# Patient Record
Sex: Female | Born: 1955 | Race: White | Hispanic: No | Smoking: Current some day smoker
Health system: Southern US, Community
[De-identification: ages and names within clinical notes are randomized; demographics above are authoritative.]

## PROBLEM LIST (undated history)

## (undated) DIAGNOSIS — F32A Depression, unspecified: Secondary | ICD-10-CM

## (undated) DIAGNOSIS — F329 Major depressive disorder, single episode, unspecified: Secondary | ICD-10-CM

## (undated) HISTORY — PX: TOTAL HIP ARTHROPLASTY: SHX124

## (undated) HISTORY — PX: BREAST ENHANCEMENT SURGERY: SHX7

## (undated) HISTORY — DX: Major depressive disorder, single episode, unspecified: F32.9

## (undated) HISTORY — DX: Depression, unspecified: F32.A

---

## 1988-03-05 HISTORY — PX: SMALL INTESTINE SURGERY: SHX150

## 1988-03-05 HISTORY — PX: APPENDECTOMY: SHX54

## 2002-03-05 HISTORY — PX: TUBAL LIGATION: SHX77

## 2004-10-27 ENCOUNTER — Ambulatory Visit (HOSPITAL_COMMUNITY): Admission: RE | Admit: 2004-10-27 | Discharge: 2004-10-28 | Payer: Self-pay | Admitting: Neurosurgery

## 2007-03-21 ENCOUNTER — Inpatient Hospital Stay (HOSPITAL_COMMUNITY): Admission: RE | Admit: 2007-03-21 | Discharge: 2007-03-26 | Payer: Self-pay | Admitting: Neurological Surgery

## 2007-03-21 HISTORY — PX: LUMBAR FUSION: SHX111

## 2007-05-01 ENCOUNTER — Encounter: Admission: RE | Admit: 2007-05-01 | Discharge: 2007-05-01 | Payer: Self-pay | Admitting: Neurosurgery

## 2007-06-05 ENCOUNTER — Encounter: Payer: Self-pay | Admitting: Family Medicine

## 2007-06-05 ENCOUNTER — Ambulatory Visit: Payer: Self-pay | Admitting: Family Medicine

## 2007-06-05 ENCOUNTER — Other Ambulatory Visit: Admission: RE | Admit: 2007-06-05 | Discharge: 2007-06-05 | Payer: Self-pay | Admitting: Family Medicine

## 2007-06-05 DIAGNOSIS — L84 Corns and callosities: Secondary | ICD-10-CM

## 2007-06-05 DIAGNOSIS — M7130 Other bursal cyst, unspecified site: Secondary | ICD-10-CM | POA: Insufficient documentation

## 2007-06-05 DIAGNOSIS — R9431 Abnormal electrocardiogram [ECG] [EKG]: Secondary | ICD-10-CM

## 2007-06-05 LAB — CONVERTED CEMR LAB
Blood in Urine, dipstick: NEGATIVE
Glucose, Urine, Semiquant: NEGATIVE
Ketones, urine, test strip: NEGATIVE
Protein, U semiquant: NEGATIVE
Specific Gravity, Urine: 1.02
pH: 6

## 2007-06-06 ENCOUNTER — Encounter (INDEPENDENT_AMBULATORY_CARE_PROVIDER_SITE_OTHER): Payer: Self-pay | Admitting: *Deleted

## 2007-06-06 LAB — CONVERTED CEMR LAB
Albumin: 4.5 g/dL (ref 3.5–5.2)
Alkaline Phosphatase: 39 units/L (ref 39–117)
BUN: 11 mg/dL (ref 6–23)
Basophils Absolute: 0.1 10*3/uL (ref 0.0–0.1)
Eosinophils Absolute: 0.2 10*3/uL (ref 0.0–0.7)
Eosinophils Relative: 2.2 % (ref 0.0–5.0)
GFR calc Af Amer: 98 mL/min
GFR calc non Af Amer: 81 mL/min
HCT: 50.9 % — ABNORMAL HIGH (ref 36.0–46.0)
HDL: 42.1 mg/dL (ref >39.0)
MCHC: 32.7 g/dL (ref 30.0–36.0)
MCV: 97.4 fL (ref 78.0–100.0)
Monocytes Absolute: 0.6 10*3/uL (ref 0.1–1.0)
Platelets: 307 10*3/uL (ref 150–400)
Potassium: 3.7 meq/L (ref 3.5–5.1)
RDW: 13.4 % (ref 11.5–14.6)
Sodium: 143 meq/L (ref 135–145)
Triglycerides: 191 mg/dL — ABNORMAL HIGH (ref 0–149)

## 2007-06-10 ENCOUNTER — Telehealth (INDEPENDENT_AMBULATORY_CARE_PROVIDER_SITE_OTHER): Payer: Self-pay | Admitting: *Deleted

## 2007-06-12 ENCOUNTER — Ambulatory Visit: Payer: Self-pay | Admitting: Family Medicine

## 2007-06-12 DIAGNOSIS — A5901 Trichomonal vulvovaginitis: Secondary | ICD-10-CM

## 2007-06-19 ENCOUNTER — Ambulatory Visit: Payer: Self-pay | Admitting: Cardiology

## 2007-06-19 ENCOUNTER — Ambulatory Visit: Payer: Self-pay

## 2007-06-19 ENCOUNTER — Encounter: Payer: Self-pay | Admitting: Family Medicine

## 2007-06-24 ENCOUNTER — Ambulatory Visit: Payer: Self-pay | Admitting: Family Medicine

## 2007-06-24 LAB — CONVERTED CEMR LAB
Bilirubin Urine: NEGATIVE
Ketones, urine, test strip: NEGATIVE
Urobilinogen, UA: 0.2
pH: 7

## 2007-06-25 ENCOUNTER — Ambulatory Visit: Payer: Self-pay | Admitting: Cardiology

## 2007-06-26 ENCOUNTER — Telehealth (INDEPENDENT_AMBULATORY_CARE_PROVIDER_SITE_OTHER): Payer: Self-pay | Admitting: *Deleted

## 2007-07-03 ENCOUNTER — Encounter: Admission: RE | Admit: 2007-07-03 | Discharge: 2007-07-03 | Payer: Self-pay | Admitting: Neurosurgery

## 2007-07-07 ENCOUNTER — Encounter: Payer: Self-pay | Admitting: Family Medicine

## 2007-07-31 ENCOUNTER — Encounter: Payer: Self-pay | Admitting: Cardiology

## 2007-07-31 ENCOUNTER — Ambulatory Visit: Payer: Self-pay

## 2007-09-04 ENCOUNTER — Encounter: Admission: RE | Admit: 2007-09-04 | Discharge: 2007-09-04 | Payer: Self-pay | Admitting: Neurosurgery

## 2007-10-01 ENCOUNTER — Ambulatory Visit: Payer: Self-pay | Admitting: Family Medicine

## 2007-11-11 ENCOUNTER — Encounter: Payer: Self-pay | Admitting: Family Medicine

## 2007-11-13 ENCOUNTER — Encounter: Admission: RE | Admit: 2007-11-13 | Discharge: 2007-11-13 | Payer: Self-pay | Admitting: Neurosurgery

## 2008-02-01 ENCOUNTER — Telehealth: Payer: Self-pay | Admitting: Internal Medicine

## 2008-05-26 ENCOUNTER — Inpatient Hospital Stay (HOSPITAL_COMMUNITY): Admission: RE | Admit: 2008-05-26 | Discharge: 2008-05-28 | Payer: Self-pay | Admitting: Neurosurgery

## 2008-07-06 ENCOUNTER — Other Ambulatory Visit: Admission: RE | Admit: 2008-07-06 | Discharge: 2008-07-06 | Payer: Self-pay | Admitting: Family Medicine

## 2008-07-06 ENCOUNTER — Encounter: Payer: Self-pay | Admitting: Family Medicine

## 2008-07-06 ENCOUNTER — Ambulatory Visit: Payer: Self-pay | Admitting: Family Medicine

## 2008-07-06 DIAGNOSIS — F329 Major depressive disorder, single episode, unspecified: Secondary | ICD-10-CM

## 2008-07-07 ENCOUNTER — Encounter: Payer: Self-pay | Admitting: Family Medicine

## 2008-07-08 ENCOUNTER — Encounter (INDEPENDENT_AMBULATORY_CARE_PROVIDER_SITE_OTHER): Payer: Self-pay | Admitting: *Deleted

## 2008-07-12 ENCOUNTER — Ambulatory Visit: Payer: Self-pay | Admitting: Family Medicine

## 2008-07-12 LAB — CONVERTED CEMR LAB
Blood in Urine, dipstick: NEGATIVE
Ketones, urine, test strip: NEGATIVE
Nitrite: NEGATIVE
Specific Gravity, Urine: <1.005
Urobilinogen, UA: 0.2
WBC Urine, dipstick: NEGATIVE

## 2008-07-13 ENCOUNTER — Encounter (INDEPENDENT_AMBULATORY_CARE_PROVIDER_SITE_OTHER): Payer: Self-pay | Admitting: *Deleted

## 2008-07-13 LAB — CONVERTED CEMR LAB
AST: 25 units/L (ref 0–37)
Alkaline Phosphatase: 69 units/L (ref 39–117)
Basophils Absolute: 0 10*3/uL (ref 0.0–0.1)
Basophils Relative: 0 % (ref 0.0–3.0)
Bilirubin, Direct: 0 mg/dL (ref 0.0–0.3)
CO2: 28 meq/L (ref 19–32)
Calcium: 9.9 mg/dL (ref 8.4–10.5)
Chloride: 109 meq/L (ref 96–112)
Eosinophils Absolute: 0.4 10*3/uL (ref 0.0–0.7)
Glucose, Bld: 97 mg/dL (ref 70–99)
HCT: 46 % (ref 36.0–46.0)
HDL: 54.9 mg/dL (ref >39.00)
Hemoglobin: 15.5 g/dL — ABNORMAL HIGH (ref 12.0–15.0)
Lymphs Abs: 2.3 10*3/uL (ref 0.7–4.0)
MCHC: 33.7 g/dL (ref 30.0–36.0)
MCV: 96.8 fL (ref 78.0–100.0)
Monocytes Absolute: 0.8 10*3/uL (ref 0.1–1.0)
Neutro Abs: 3.1 10*3/uL (ref 1.4–7.7)
Potassium: 4.6 meq/L (ref 3.5–5.1)
RBC: 4.75 M/uL (ref 3.87–5.11)
RDW: 14.6 % (ref 11.5–14.6)
Sodium: 144 meq/L (ref 135–145)
Total CHOL/HDL Ratio: 3
Total Protein: 7.4 g/dL (ref 6.0–8.3)

## 2008-07-14 ENCOUNTER — Encounter (INDEPENDENT_AMBULATORY_CARE_PROVIDER_SITE_OTHER): Payer: Self-pay | Admitting: *Deleted

## 2008-10-17 ENCOUNTER — Telehealth: Payer: Self-pay | Admitting: Family Medicine

## 2008-10-21 ENCOUNTER — Telehealth (INDEPENDENT_AMBULATORY_CARE_PROVIDER_SITE_OTHER): Payer: Self-pay | Admitting: *Deleted

## 2008-11-17 LAB — HM MAMMOGRAPHY: HM Mammogram: NORMAL

## 2008-12-13 ENCOUNTER — Encounter: Payer: Self-pay | Admitting: Family Medicine

## 2009-01-20 ENCOUNTER — Encounter: Payer: Self-pay | Admitting: Family Medicine

## 2009-02-15 ENCOUNTER — Telehealth: Payer: Self-pay | Admitting: Family Medicine

## 2009-02-18 ENCOUNTER — Telehealth (INDEPENDENT_AMBULATORY_CARE_PROVIDER_SITE_OTHER): Payer: Self-pay | Admitting: *Deleted

## 2009-03-24 ENCOUNTER — Encounter: Payer: Self-pay | Admitting: Family Medicine

## 2009-05-26 ENCOUNTER — Ambulatory Visit: Payer: Self-pay | Admitting: Family Medicine

## 2009-05-31 ENCOUNTER — Telehealth (INDEPENDENT_AMBULATORY_CARE_PROVIDER_SITE_OTHER): Payer: Self-pay | Admitting: *Deleted

## 2009-07-29 ENCOUNTER — Telehealth: Payer: Self-pay | Admitting: Family Medicine

## 2009-07-30 ENCOUNTER — Encounter: Payer: Self-pay | Admitting: Family Medicine

## 2009-08-02 ENCOUNTER — Telehealth: Payer: Self-pay | Admitting: Family Medicine

## 2009-08-02 ENCOUNTER — Encounter: Payer: Self-pay | Admitting: Family Medicine

## 2009-08-02 DIAGNOSIS — M25559 Pain in unspecified hip: Secondary | ICD-10-CM | POA: Insufficient documentation

## 2009-08-17 ENCOUNTER — Other Ambulatory Visit: Admission: RE | Admit: 2009-08-17 | Discharge: 2009-08-17 | Payer: Self-pay | Admitting: Family Medicine

## 2009-08-17 ENCOUNTER — Ambulatory Visit: Payer: Self-pay | Admitting: Family Medicine

## 2009-08-17 DIAGNOSIS — M1610 Unilateral primary osteoarthritis, unspecified hip: Secondary | ICD-10-CM | POA: Insufficient documentation

## 2009-08-17 DIAGNOSIS — B356 Tinea cruris: Secondary | ICD-10-CM

## 2009-08-17 DIAGNOSIS — M169 Osteoarthritis of hip, unspecified: Secondary | ICD-10-CM

## 2009-08-17 LAB — CONVERTED CEMR LAB
Bilirubin Urine: NEGATIVE
Ketones, urine, test strip: NEGATIVE
Nitrite: NEGATIVE
Protein, U semiquant: NEGATIVE
Urobilinogen, UA: NEGATIVE

## 2009-08-19 ENCOUNTER — Encounter (INDEPENDENT_AMBULATORY_CARE_PROVIDER_SITE_OTHER): Payer: Self-pay | Admitting: *Deleted

## 2009-08-19 LAB — CONVERTED CEMR LAB
ALT: 19 units/L (ref 0–35)
AST: 28 units/L (ref 0–37)
Alkaline Phosphatase: 66 units/L (ref 39–117)
Basophils Relative: 0.5 % (ref 0.0–3.0)
Bilirubin, Direct: 0.1 mg/dL (ref 0.0–0.3)
Calcium: 9.8 mg/dL (ref 8.4–10.5)
Cholesterol: 209 mg/dL — ABNORMAL HIGH (ref 0–200)
Creatinine, Ser: 0.8 mg/dL (ref 0.4–1.2)
Eosinophils Relative: 4.1 % (ref 0.0–5.0)
Lymphocytes Relative: 30.2 % (ref 12.0–46.0)
Monocytes Relative: 9.4 % (ref 3.0–12.0)
Neutrophils Relative %: 55.8 % (ref 43.0–77.0)
RBC: 4.14 M/uL (ref 3.87–5.11)
Sodium: 139 meq/L (ref 135–145)
Total CHOL/HDL Ratio: 5
Total Protein: 7.5 g/dL (ref 6.0–8.3)
Triglycerides: 304 mg/dL — ABNORMAL HIGH (ref 0.0–149.0)
VLDL: 60.8 mg/dL — ABNORMAL HIGH (ref 0.0–40.0)
WBC: 6.3 10*3/uL (ref 4.5–10.5)

## 2009-11-01 ENCOUNTER — Ambulatory Visit: Payer: Self-pay | Admitting: Family Medicine

## 2009-11-01 DIAGNOSIS — N390 Urinary tract infection, site not specified: Secondary | ICD-10-CM | POA: Insufficient documentation

## 2009-11-01 LAB — CONVERTED CEMR LAB
Blood in Urine, dipstick: NEGATIVE
Glucose, Urine, Semiquant: NEGATIVE
Nitrite: POSITIVE
Protein, U semiquant: NEGATIVE
pH: 5

## 2009-11-02 ENCOUNTER — Encounter: Payer: Self-pay | Admitting: Family Medicine

## 2009-11-03 ENCOUNTER — Telehealth: Payer: Self-pay | Admitting: Family Medicine

## 2009-11-08 ENCOUNTER — Encounter (INDEPENDENT_AMBULATORY_CARE_PROVIDER_SITE_OTHER): Payer: Self-pay | Admitting: *Deleted

## 2009-11-30 ENCOUNTER — Encounter: Payer: Self-pay | Admitting: Family Medicine

## 2009-12-06 ENCOUNTER — Ambulatory Visit (HOSPITAL_COMMUNITY): Admission: RE | Admit: 2009-12-06 | Discharge: 2009-12-06 | Payer: Self-pay | Admitting: Urology

## 2009-12-09 ENCOUNTER — Encounter: Payer: Self-pay | Admitting: Family Medicine

## 2009-12-12 ENCOUNTER — Encounter: Payer: Self-pay | Admitting: Family Medicine

## 2010-04-04 NOTE — Progress Notes (Signed)
Summary: Referral   Phone Note Call from Patient Call back at Home Phone [phone]   Caller: Patient Reason for Call: Referral Summary of Call: Patient needs a referral to [facility] for pain mangement. Initial call taken by: Harold Barban,  Aug 02, 2009 2:13 PM  Follow-up for Phone Call        she is getting ready to have a hip replacement----  why does she need pain management now? Follow-up by: Loreen Freud DO,  Aug 02, 2009 2:53 PM  Additional Follow-up for Phone Call Additional follow up Details #1::        left message to call back. Army Fossa CMA  Aug 02, 2009 3:01 PM   New Problems: HIP PAIN, BILATERAL (ICD-719.45)   Additional Follow-up for Phone Call Additional follow up Details #2::    Pt states that Dr.Moore has referred her to pain management, they stated they also PCP to refer her to. Army Fossa CMA  Aug 02, 2009 3:16 PM   Additional Follow-up for Phone Call Additional follow up Details #3:: Details for Additional Follow-up Action Taken: referral put in but we have no records because we did not see her for pain.   Additional Follow-up by: Loreen Freud DO,  Aug 02, 2009 3:30 PM  New Problems: HIP PAIN, BILATERAL (ICD-719.45)

## 2010-04-04 NOTE — Letter (Signed)
Summary: Surgical Clearance/[facility]  Surgical Clearance/[facility]   Imported By: Lanelle Bal 08/10/2009 10:04:40  _____________________________________________________________________  External Attachment:    Type:   Image     Comment:   External Document

## 2010-04-04 NOTE — Letter (Signed)
Summary: Primary Care Consult Scheduled Letter  [facility] [place]  [address] [place], [place] [address]   Phone: [phone]  Fax: [phone]      11/08/2009 MRN: [id number]  Ashley Salinas [address] [place], [place]  [address]    Dear [name],    We have scheduled an appointment for you.  At the recommendation of Dr. Loreen Freud, we have scheduled you a consult with Dr. Su Grand of [facility] on 11-30-2009 at 9:15am.  Their address is [address] floor, [place] [address]. The office phone number is [phone].  If this appointment day and time is not convenient for you, please feel free to call the office of the doctor you are being referred to at the number listed above and reschedule the appointment.    It is important for you to keep your scheduled appointments. We are here to make sure you are given good patient care.   Thank you,    Renee, Patient Care Coordinator [place] [facility]

## 2010-04-04 NOTE — Consult Note (Signed)
Summary: [facility]  [facility]   Imported By: Lennie Odor 12/09/2009 10:43:02  _____________________________________________________________________  External Attachment:    Type:   Image     Comment:   External Document

## 2010-04-04 NOTE — Progress Notes (Signed)
Summary: Surgical Clearance(ASAP)  Phone Note From Other Clinic Call back at [phone], Fax [phone]   Caller: [facility] [place] Summary of Call: Patient goes for [facility] at 1pm today and Dr.Moore's office needs to know if patient was cleared or not for surgery. Would like OV stating patient was cleared faxed to above Fax number, If any questions please call [place]  May 31, 2009 10:06 AM   Follow-up for Phone Call        Left message for [place].. It does state in the OV that she was surgically cleared for surgery. Army Fossa CMA  May 31, 2009 10:21 AM   Additional Follow-up for Phone Call Additional follow up Details #1::        Spoke with [facility]. Faxed over pts OV. Army Fossa CMA  May 31, 2009 10:34 AM

## 2010-04-04 NOTE — Progress Notes (Signed)
Summary: surgery clearance  Phone Note Call from Patient Call back at [phone] ext [phone]   Caller: [place] ([facility]) Summary of Call: Pt seen on 05-26-09 for surgery clearance for right hip.pt is to have another hip replacement on the left side. Pt would like to know if it would be possible to  give clearance without seeing dr Laury Axon again .Marland KitchenPLS advise...................Marland KitchenFelecia Deloach CMA  Jul 29, 2009 3:44 PM   Follow-up for Phone Call        Yes ---she is cleared for surgery Follow-up by: Loreen Freud DO,  Jul 30, 2009 8:37 AM  Additional Follow-up for Phone Call Additional follow up Details #1::        left detail message pt cleared for surgery and letter Fax [phone]. Office to call if any further info needed............Marland KitchenFelecia Deloach CMA  Aug 02, 2009 9:28 AM

## 2010-04-04 NOTE — Letter (Signed)
Summary: Generic Letter  [facility] [place]  [address] [place], [place] [address]   Phone: [phone]  Fax: [phone]    07/30/2009  Re: Ashley Salinas [address] [place], [place]  [address]  To Whom It May Concern:  Ms Eschete is a 55yo white female with a history of depression.  She is medically cleared to have a hip replacement.  Please feel free to call with any questions.            Sincerely,   Loreen Freud DO

## 2010-04-04 NOTE — Assessment & Plan Note (Signed)
Summary: CPX AND PAP AND FASTING LABS///[facility]   Vital Signs:  Patient profile:   55 year old female Weight:      171.13 pounds BMI:     26.50 Pulse rate:   85 / minute Pulse rhythm:   regular BP sitting:   118 / 72  (left arm) Cuff size:   regular  Vitals Entered By: Army Fossa CMA (August 17, 2009 9:10 AM) CC: Pt here for CPX, and pap. Pain Assessment Patient in pain? yes     Location: hip Nutritional Status BMI of 25 - 29 = overweight Nutritional Status Detail Pt eats a healthy diet  Does patient need assistance? Functional Status Self care, Cook/clean, Shopping, Social activities Ambulation Impaired:Risk for fall Comments Pt uses walker to get around  Vision Screening:      Vision Comments: optho-- q1y-- + glasses for reading--- [place] Quant 40db HL: Left  Right  Audiometry Comment: gross normal hearing with whisper -- 6 feet    Prevention & Chronic Care Immunizations   Influenza vaccine: Fluvax 3+  (01/20/2009)   Influenza vaccine due: 01/20/2010    Tetanus booster: 12/25/2006: Td   Tetanus booster due: 12/24/2016    Pneumococcal vaccine: Not documented  Colorectal Screening   Hemoccult: Not documented    Colonoscopy: polyps--  repeat 1 year-- [facility]  (05/23/2007)   Colonoscopy due: 05/22/2017  Other Screening   Pap smear: NEGATIVE FOR INTRAEPITHELIAL LESIONS OR MALIGNANCY.  (07/06/2008)   Pap smear due: 07/06/2009    Mammogram: normal  (11/17/2008)   Mammogram due: 11/17/2009   Smoking status: current  (08/17/2009)   Smoking cessation counseling: yes  (08/17/2009)  Lipids   Total Cholesterol: 179  (07/12/2008)   LDL: 103  (07/12/2008)   LDL Direct: Not documented   HDL: 54.90  (07/12/2008)   Triglycerides: 108.0  (07/12/2008)   History of Present Illness: Pt her for cpe, pap and labs.   Pt only c/o rash in groin area for about 2-3 weeks.  Pt tried A&D ointment and other otc powders etc with no relief.  + watery d/c ---no odor.    Preventive  Screening-Counseling & Management  Alcohol-Tobacco     Alcohol drinks/day: <1     Alcohol type: wine     Smoking Status: current     Smoking Cessation Counseling: yes     Smoke Cessation Stage: ready     Packs/Day: 0.5     Year Started: 1973     Passive Smoke Exposure: no  Caffeine-Diet-Exercise     Caffeine use/day: 2     Does Patient Exercise: yes     Type of exercise: walking  Hep-HIV-STD-Contraception     HIV Risk: no     Dental Visit-last 6 months yes     Dental Care Counseling: not indicated; dental care within six months     SBE monthly: yes     SBE Education/Counseling: not indicated; SBE done regularly     Sun Exposure-Excessive: yes     Sun Exposure Counseling: to decrease sun exposure  Safety-Violence-Falls     Seat Belt Use: yes     Seat Belt Counseling: not indicated; patient wears seat belts     Firearms in the Home: no firearms in the home     Smoke Detectors: yes     Violence in the Home: no risk noted     Violence Counseling: not indicated; no violence risk noted     Sexual Abuse: no     Sexual Abuse Counseling:  no     Fall Risk: yes     Fall Risk Counseling: counseling provided; falls with injury noted  Comments: Pt is not stable when walking secondary to hips--- hip replacement scheduled for July      Sexual History:  currently monogamous and widow.        Drug Use:  never.    Current Medications (verified): 1)  Percocet 10-325 Mg  Tabs (Oxycodone-Acetaminophen) .Marland Kitchen.. 1-2 By Mouth Q4h As Needed 2)  Valium 5 Mg  Tabs (Diazepam) .Marland Kitchen.. 1 By Mouth At Bedtime 3)  Lexapro 10 Mg Tabs (Escitalopram Oxalate) .... 3 Tab By Mouth Once Daily 4)  Klonopin 2 Mg Tabs (Clonazepam) .Marland Kitchen.. 1 By Mouth Daily 5)  Lotrisone 1-0.05 % Crea (Clotrimazole-Betamethasone) .... Apply Two Times A Day  Allergies: 1)  ! * Azithromycin  Past History:  Past Medical History: Last updated: 07/06/2008 Current Problems:  CALLUS, RIGHT FOOT (ICD-700) SYNOVIAL CYST  (ICD-727.40) OTH PLASTIC SURGERY UNACCEPTABLE COSMETIC APPEAR (ICD-V50.1) Depression  Family History: Last updated: 06/05/2007 mother-breast cancer  Family History Breast cancer 1st degree relative <50  Social History: Last updated: 05/26/2009 etoh-occasionally Occupation: [employer] disability Current Smoker Alcohol use-yes Drug use-no Regular exercise-no widow  Risk Factors: Alcohol Use: <1 (08/17/2009) Caffeine Use: 2 (08/17/2009) Exercise: yes (08/17/2009)  Risk Factors: Smoking Status: current (08/17/2009) Packs/Day: 0.5 (08/17/2009) Passive Smoke Exposure: no (08/17/2009)  Past Surgical History: Lumbar fusion (03/21/2007) breast aug. 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 mother-breast cancer  Family History Breast cancer 1st degree relative <50  Social History: Reviewed history from 05/26/2009 and no changes required. etoh-occasionally Occupation: [employer] disability Current Smoker Alcohol use-yes Drug use-no Regular exercise-no widowFall Risk:  yes Sexual History:  currently monogamous, widow   Review of Systems      See HPI General:  Denies chills, fatigue, fever, loss of appetite, malaise, sleep disorder, sweats, weakness, and weight loss. Eyes:  Denies blurring, discharge, double vision, eye irritation, eye pain, halos, itching, light sensitivity, red eye, vision loss-1 eye, and vision loss-both eyes. ENT:  Denies decreased hearing, difficulty swallowing, ear discharge, earache, hoarseness, nasal congestion, nosebleeds, postnasal drainage, ringing in ears, sinus pressure, and sore throat. CV:  Denies  bluish discoloration of lips or nails, chest pain or discomfort, difficulty breathing at night, difficulty breathing while lying down, fainting, fatigue, leg cramps with exertion, lightheadness, near fainting, palpitations, shortness of breath with exertion, swelling of feet, swelling of hands, and weight gain. Resp:  Denies chest discomfort, chest pain with inspiration, cough, coughing up blood, excessive snoring, hypersomnolence, morning headaches, pleuritic, shortness of breath, sputum productive, and wheezing. GI:  Denies abdominal pain, bloody stools, change in bowel habits, constipation, dark tarry stools, diarrhea, excessive appetite, gas, hemorrhoids, indigestion, loss of appetite, nausea, vomiting, vomiting blood, and yellowish skin color. GU:  Complains of discharge; denies abnormal vaginal bleeding, decreased libido, dysuria, genital sores, hematuria, incontinence, nocturia, urinary frequency, and urinary hesitancy. MS:  Complains of joint pain; denies joint redness, joint swelling, loss of strength, low back pain, mid back pain, muscle aches, muscle , cramps, muscle weakness, stiffness, and thoracic pain; b/l hip pain. Derm:  Denies changes in color of skin, changes in nail beds, dryness, excessive perspiration, flushing, hair loss,  insect bite(s), itching, lesion(s), poor wound healing, and rash. Neuro:  Denies brief paralysis, difficulty with concentration, disturbances in coordination, falling down, headaches, inability to speak, memory loss, numbness, poor balance, seizures, sensation of room spinning, tingling, tremors, visual disturbances, and weakness. Psych:  Denies alternate hallucination ( auditory/visual), anxiety, depression, easily angered, easily tearful, irritability, mental problems, panic attacks, sense of great danger, suicidal thoughts/plans, thoughts of violence, unusual visions or sounds, and thoughts /plans of harming others; psych-- Dr Sandria Manly --- [facility]. Endo:  Denies cold intolerance, excessive hunger, excessive thirst, excessive urination, heat intolerance, polyuria, and weight change. Heme:  Denies abnormal bruising, bleeding, enlarge lymph nodes, fevers, pallor, and skin discoloration. Allergy:  Denies hives or rash, itching eyes, persistent infections, seasonal allergies, and sneezing.  Physical Exam  General:  Well-developed,well-nourished,in no acute distress; alert,appropriate and cooperative throughout examination Head:  Normocephalic and atraumatic without obvious abnormalities. No apparent alopecia or balding. Eyes:  vision grossly intact, pupils equal, pupils round, pupils reactive to light, and no injection.   Ears:  External ear exam shows no significant lesions or deformities.  Otoscopic examination reveals clear canals, tympanic membranes are intact bilaterally without bulging, retraction, inflammation or discharge. Hearing is grossly normal bilaterally. Nose:  External nasal examination shows no deformity or inflammation. Nasal mucosa are pink and moist without lesions or exudates. Mouth:  Oral mucosa and oropharynx without lesions or exudates.  Teeth in good repair. Neck:  No deformities, masses, or tenderness noted.no carotid bruits.   Chest Wall:  No deformities, masses, or tenderness noted. Breasts:  No mass, nodules, thickening, tenderness, bulging, retraction, inflamation, nipple discharge or skin changes noted.   Lungs:  Normal respiratory effort, chest expands symmetrically. Lungs are clear to auscultation, no crackles or wheezes. Heart:  Normal rate and regular rhythm. S1 and S2 normal without gallop, murmur, click, rub or other extra sounds. Abdomen:  Bowel sounds positive,abdomen soft and non-tender without masses, organomegaly or hernias noted. Rectal:  No external abnormalities noted. Normal sphincter tone. No rectal masses or tenderness. Genitalia:  Pelvic Exam:        External: normal female genitalia  + errythema c/w candida  no d/c no masses        Vagina: normal without lesions or masses        Cervix: normal without lesions or masses        Adnexa: normal bimanual exam without masses or fullness        Uterus: normal by palpation        Pap smear: performed Msk:  decrease rom hips good strength upper ext Pulses:  R posterior tibial normal, R dorsalis pedis normal, R carotid normal, L posterior tibial normal, L dorsalis pedis normal, and L carotid normal.   Extremities:  No clubbing, cyanosis, edema, or deformity noted with normal full range of motion of all joints.   Neurologic:  No cranial nerve deficits noted. Station and gait are normal. Plantar reflexes are down-going bilaterally. DTRs are symmetrical throughout.  Skin:  Intact without suspicious lesions or rashes Cervical Nodes:  No lymphadenopathy noted Axillary Nodes:  No palpable lymphadenopathy Psych:  Cognition and judgment appear intact. Alert and cooperative with normal attention span and concentration. No apparent delusions, illusions, hallucinations   Impression & Recommendations:  Problem # 1:  PREVENTIVE HEALTH CARE (ICD-V70.0)  Orders: Venipuncture (84132) TLB-Lipid Panel (80061-LIPID) TLB-BMP (Basic Metabolic Panel-BMET) (80048-METABOL) TLB-CBC Platelet - w/Differential (85025-CBCD) TLB-Hepatic/Liver Function Pnl (80076-HEPATIC) UA Dipstick w/o Micro (manual) (44010) First annual  wellness visit with prevention plan  (Z6109) EKG w/ Interpretation (93000) Pelvic & Breast Exam ( Medicare)  (G0101) Prescription Created Electronically [id number])  Problem # 2:  TINEA CRURIS (ICD-110.3)  lotrisone two times a day   Orders: Prescription Created Electronically [phone])  Problem # 3:  DEGENERATIVE JOINT DISEASE, HIPS (ICD-715.95)  Her updated medication list for this problem includes:    Percocet 10-325 Mg Tabs (Oxycodone-acetaminophen) .Marland Kitchen... 1-2 by mouth q4h as needed  Problem # 4:  DEPRESSION (ICD-311)  Her  updated medication list for this problem includes:    Valium 5 Mg Tabs (Diazepam) .Marland Kitchen... 1 by mouth at bedtime    Lexapro 10 Mg Tabs (Escitalopram oxalate) .Marland KitchenMarland KitchenMarland KitchenMarland Kitchen 3 tab by mouth once daily    Klonopin 2 Mg Tabs (Clonazepam) .Marland Kitchen... 1 by mouth daily  Complete Medication List: 1)  Percocet 10-325 Mg Tabs (Oxycodone-acetaminophen) .Marland Kitchen.. 1-2 by mouth q4h as needed 2)  Valium 5 Mg Tabs (Diazepam) .Marland Kitchen.. 1 by mouth at bedtime 3)  Lexapro 10 Mg Tabs (Escitalopram oxalate) .... 3 tab by mouth once daily 4)  Klonopin 2 Mg Tabs (Clonazepam) .Marland Kitchen.. 1 by mouth daily 5)  Lotrisone 1-0.05 % Crea (Clotrimazole-betamethasone) .... Apply two times a day Prescriptions: LOTRISONE 1-0.05 % CREA (CLOTRIMAZOLE-BETAMETHASONE) apply two times a day  #30g x 1   Entered and Authorized by:   Loreen Freud DO   Signed by:   Loreen Freud DO on 08/17/2009   Method used:   Print then Give to Patient   RxID:   [id number]     Laboratory Results   Urine Tests   Date/Time Reported: August 17, 2009 10:32 AM   Routine Urinalysis   Color: yellow Appearance: Clear Glucose: negative   (Normal Range: Negative) Bilirubin: negative   (Normal Range: Negative) Ketone: negative   (Normal Range: Negative) Spec. Gravity: 1.015   (Normal Range: 1.003-1.035) Blood: negative   (Normal Range: Negative) pH: 5.0   (Normal Range: 5.0-8.0) Protein: negative   (Normal Range: Negative) Urobilinogen: negative   (Normal Range: 0-1) Nitrite: negative   (Normal Range: Negative) Leukocyte Esterace: negative   (Normal Range: Negative)    Comments: Floydene Flock  August 17, 2009 10:32 AM

## 2010-04-04 NOTE — Assessment & Plan Note (Signed)
Summary: SURGICAL CLEARENCE/KDC   Vital Signs:  Patient profile:   55 year old female Height:      67.5 inches Weight:      172 pounds BMI:     26.64 Temp:     98.8 degrees F oral Pulse rate:   82 / minute Pulse rhythm:   regular BP sitting:   110 / 70  (left arm) Cuff size:   regular  Vitals Entered By: Army Fossa CMA (May 26, 2009 1:00 PM) CC: Pt here for Surgical Clearence., Pre-op Evaluation   History of Present Illness:  Pre-op Evaluation      I was asked to see this delightful patient today for Pre-op Evaluation.  Pt here for surgical clearance for R hip replacement---- she has the Depuy hip and there is a recall on the hip.  Surgery to be done in [facility] with Dr Darnell Level.  The patient complains of smoking, but denies respiratory symptoms, GI bleeding, chest pain, edema, PND, and heavy ETOH use.  Patient has no history of acute or recent MI, unstable or severe angina, decompensated CHF, high grade AV block, symptomatic ventricular arrhythmia, and severe valvular disease.  Patient has no history of mild angina(m), previous MI(m), compensated CHF(m), diabetes(m), renal insufficiency(m), advanced age(l), abnormal ECG(l), rhythm other than sinus(l), low functional capacity(l), stroke history(l), and uncontrolled HTN(l).  There is no history of antiplatelet agents, chronic steroids, warfarin, diabetes meds, antianginal meds, bleeding disorder, and FH anesthesia reaction.  Pt had pericardial effusion 2009--- last Echo showed only trivial effusion then (07/2007).  Pt has had no sighns/ symptoms since.    Preventive Screening-Counseling & Management  Alcohol-Tobacco     Alcohol drinks/day: <1     Alcohol type: wine     Smoking Status: current     Smoking Cessation Counseling: yes     Smoke Cessation Stage: ready     Packs/Day: 0.5     Year Started: 1973     Passive Smoke Exposure: no  Caffeine-Diet-Exercise     Caffeine use/day: 2     Does Patient Exercise:  yes     Type of exercise: walking  Hep-HIV-STD-Contraception     HIV Risk: no     Dental Visit-last 6 months yes     Dental Care Counseling: not indicated; dental care within six months     SBE monthly: yes     SBE Education/Counseling: not indicated; SBE done regularly     Sun Exposure-Excessive: yes     Sun Exposure Counseling: to decrease sun exposure  Safety-Violence-Falls     Seat Belt Use: yes     Seat Belt Counseling: not indicated; patient wears seat belts     Firearms in the Home: no firearms in the home     Violence in the Home: no risk noted      Sexual History:  single.    Current Medications (verified): 1)  Percocet 10-325 Mg  Tabs (Oxycodone-Acetaminophen) 2)  Valium 5 Mg  Tabs (Diazepam) 3)  Lexapro 10 Mg Tabs (Escitalopram Oxalate) .... 3 Tab By Mouth Once Daily 4)  Neurontin 300 Mg Caps (Gabapentin) .... Take One Tab By Mouth Three Times A Day  Allergies: 1)  ! * Azithromycin  Family History: Reviewed history from 06/05/2007 and no changes required. mother-breast cancer  Family History Breast cancer 1st degree relative <50  Social History: Reviewed history from 06/05/2007 and no changes required. etoh-occasionally Occupation: [employer] disability Current Smoker Alcohol use-yes Drug use-no Regular exercise-no  widowSexual History:  single  Review of Systems      See HPI General:  Denies chills, fatigue, fever, loss of appetite, malaise, sleep disorder, sweats, weakness, and weight loss. Eyes:  Denies blurring, discharge, double vision, eye irritation, eye pain, halos, itching, light sensitivity, red eye, vision loss-1 eye, and vision loss-both eyes. ENT:  Denies decreased hearing, difficulty swallowing, ear discharge, earache, hoarseness, nasal congestion, nosebleeds, postnasal drainage, ringing in ears, sinus pressure, and sore throat. CV:  Denies bluish discoloration of lips or nails, chest pain or discomfort, difficulty breathing at night, difficulty  breathing while lying down, fainting, fatigue, leg cramps with exertion, lightheadness, near fainting, palpitations, shortness of breath with exertion, swelling of feet, swelling of hands, and weight gain. Resp:  Denies chest discomfort, chest pain with inspiration, cough, coughing up blood, excessive snoring, hypersomnolence, morning headaches, pleuritic, shortness of breath, sputum productive, and wheezing. GI:  Denies abdominal pain, bloody stools, change in bowel habits, constipation, dark tarry stools, diarrhea, excessive appetite, gas, hemorrhoids, indigestion, loss of appetite, nausea, vomiting, vomiting blood, and yellowish skin color. GU:  Denies abnormal vaginal bleeding, decreased libido, discharge, dysuria, genital sores, hematuria, incontinence, nocturia, urinary frequency, and urinary hesitancy. MS:  Complains of joint pain; denies joint redness, joint swelling, loss of strength, low back pain, mid back pain, muscle aches, muscle , cramps, muscle weakness, stiffness, and thoracic pain. Derm:  Denies changes in color of skin, changes in nail beds, dryness, excessive perspiration, flushing, hair loss, insect bite(s), itching, lesion(s), poor wound healing, and rash. Neuro:  Denies brief paralysis, difficulty with concentration, disturbances in coordination, falling down, headaches, inability to speak, memory loss, numbness, poor balance, seizures, sensation of room spinning, tingling, tremors, visual disturbances, and weakness. Psych:  Denies alternate hallucination ( auditory/visual), anxiety, depression, easily angered, easily tearful, irritability, mental problems, panic attacks, sense of great danger, suicidal thoughts/plans, thoughts of violence, unusual visions or sounds, and thoughts /plans of harming others. Endo:  Denies cold intolerance, excessive hunger, excessive thirst, excessive urination, heat intolerance, polyuria, and weight change. Heme:  Denies abnormal bruising, bleeding,  enlarge lymph nodes, fevers, pallor, and skin discoloration. Allergy:  Denies hives or rash, itching eyes, persistent infections, seasonal allergies, and sneezing.  Physical Exam  General:  Well-developed,well-nourished,in no acute distress; alert,appropriate and cooperative throughout examination Head:  Normocephalic and atraumatic without obvious abnormalities. No apparent alopecia or balding. Ears:  External ear exam shows no significant lesions or deformities.  Otoscopic examination reveals clear canals, tympanic membranes are intact bilaterally without bulging, retraction, inflammation or discharge. Hearing is grossly normal bilaterally. Nose:  External nasal examination shows no deformity or inflammation. Nasal mucosa are pink and moist without lesions or exudates. Mouth:  Oral mucosa and oropharynx without lesions or exudates.  Teeth in good repair. Neck:  No deformities, masses, or tenderness noted. Lungs:  Normal respiratory effort, chest expands symmetrically. Lungs are clear to auscultation, no crackles or wheezes. Heart:  normal rate, no murmur, no gallop, and no rub.   Abdomen:  Bowel sounds positive,abdomen soft and non-tender without masses, organomegaly or hernias noted. Msk:  L hip flexion decreased good strength all other extrem. Pulses:  R posterior tibial normal, R dorsalis pedis normal, R carotid normal, L posterior tibial normal, L dorsalis pedis normal, and L carotid normal.   Extremities:  No clubbing, cyanosis, edema, or deformity noted with normal full range of motion of all joints.   Neurologic:  alert & oriented  see above Skin:  Intact without suspicious lesions or  rashes Cervical Nodes:  No lymphadenopathy noted Psych:  Oriented X3, normally interactive, good eye contact, not anxious appearing, and not depressed appearing.     Impression & Recommendations:  Problem # 1:  PREOPERATIVE EXAMINATION (ICD-V72.84)  pt surgically cleared for hip replacement    Orders: EKG w/ Interpretation (93000)  Problem # 2:  DEPRESSION (ICD-311)  Her updated medication list for this problem includes:    Valium 5 Mg Tabs (Diazepam) .Marland Kitchen... 1 by mouth at bedtime    Lexapro 10 Mg Tabs (Escitalopram oxalate) .Marland KitchenMarland KitchenMarland KitchenMarland Kitchen 3 tab by mouth once daily  Complete Medication List: 1)  Percocet 10-325 Mg Tabs (Oxycodone-acetaminophen) .Marland Kitchen.. 1 by mouth q4h as needed 2)  Valium 5 Mg Tabs (Diazepam) .Marland Kitchen.. 1 by mouth at bedtime 3)  Lexapro 10 Mg Tabs (Escitalopram oxalate) .... 3 tab by mouth once daily 4)  Neurontin 300 Mg Caps (Gabapentin) .... Take 2 tab by mouth three times a day Prescriptions: NEURONTIN 300 MG CAPS (GABAPENTIN) take 2 tab by mouth three times a day  #180 x 5   Entered and Authorized by:   Loreen Freud DO   Signed by:   Loreen Freud DO on 05/26/2009   Method used:   Historical   RxID:   [id number] LEXAPRO 10 MG TABS (ESCITALOPRAM OXALATE) 3 tab by mouth once daily  #90 x 5   Entered and Authorized by:   Loreen Freud DO   Signed by:   Loreen Freud DO on 05/26/2009   Method used:   Electronically to        [facility] (retail)       [address]       [place], [place]  [address]       Ph: [phone]       Fax: [phone]   RxID:   [id number]     Mammogram Result Date:  11/17/2008 Mammogram Result:  normal Mammogram Next Due:  1 yr

## 2010-04-04 NOTE — Letter (Signed)
Summary: [facility]  [facility]   Imported By: Lanelle Bal 12/22/2009 16:26:46  _____________________________________________________________________  External Attachment:    Type:   Image     Comment:   External Document

## 2010-04-04 NOTE — Letter (Signed)
Summary: Results Follow up Letter  [facility] [place]  [address] [place], [place] [address]   Phone: [phone]  Fax: [phone]    08/19/2009 MRN: [id number]  Ashley Salinas [address] [place], [place]  [address]  Dear [name],  The following are the results of your recent test(s):  Test         Result    Pap Smear:        Normal __X___  Not Normal _____ Comments: ______________________________________________________ Cholesterol: LDL(Bad cholesterol):         Your goal is less than:         HDL (Good cholesterol):       Your goal is more than: Comments:  ______________________________________________________ Mammogram:        Normal _____  Not Normal _____ Comments:  ___________________________________________________________________ Hemoccult:        Normal _____  Not normal _______ Comments:    _____________________________________________________________________ Other Tests:    We routinely do not discuss normal results over the telephone.  If you desire a copy of the results, or you have any questions about this information we can discuss them at your next office visit.   Sincerely,    Army Fossa CMA  August 19, 2009 8:10 AM

## 2010-04-04 NOTE — Progress Notes (Signed)
Summary: Referral  Phone Note Outgoing Call Call back at Home Phone [phone]   Details for Reason: Referral Summary of Call: + uti---treated with cipro  Spk with pt adv her of her results,  she has no problems at this time but wanted to get a referral to a Urologist close to or in [facility] Please Advise.                  Almeta Monas CMA Duncan Dull)  November 03, 2009 1:59 PM   Follow-up for Phone Call        renee [facility]--if that is not good for her --we will need to change it to [place] Follow-up by: Loreen Freud DO,  November 03, 2009 2:08 PM  Additional Follow-up for Phone Call Additional follow up Details #1::        pt ok with [facility], awaiting appt info................Marland KitchenFelecia Deloach CMA  November 04, 2009 8:47 AM

## 2010-04-04 NOTE — Assessment & Plan Note (Signed)
Summary: UTI INFECTION//PH   Vital Signs:  Patient profile:   55 year old female Weight:      179.4 pounds Temp:     99.2 degrees F oral Pulse rate:   80 / minute Pulse rhythm:   regular BP sitting:   122 / 84  (left arm)  Vitals Entered By: Almeta Monas CMA Duncan Dull) (November 01, 2009 3:13 PM) CC: c/o dark urine, thinks she has a UTI   History of Present Illness: Pt is here c/o dark urine and frequency.  no fever or back/abd pain.  Current Medications (verified): 1)  Valium 5 Mg  Tabs (Diazepam) .Marland Kitchen.. 1 By Mouth At Bedtime 2)  Lexapro 10 Mg Tabs (Escitalopram Oxalate) .... 3 Tab By Mouth Once Daily 3)  Klonopin 2 Mg Tabs (Clonazepam) .Marland Kitchen.. 1 By Mouth Daily 4)  Trilipix 135 Mg Cpdr (Choline Fenofibrate) .Marland Kitchen.. 1 By Mouth Daily. 5)  Oxycodone Hcl 15 Mg Tabs (Oxycodone Hcl) .Marland Kitchen.. 1 Tablet Every 4 Hours 6)  Cipro 500 Mg Tabs (Ciprofloxacin Hcl) .Marland Kitchen.. 1 By Mouth Two Times A Day 7)  Elocon 0.1 % Crea (Mometasone Furoate) .... Apply Once Daily  Allergies (verified): 1)  ! * Azithromycin  Past History:  Past medical, surgical, family and social histories (including risk factors) reviewed for relevance to current acute and chronic problems.  Past Medical History: Reviewed history from 07/06/2008 and no changes required. 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 Lumbar fusion (03/21/2007) breast aug. 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 mother-breast cancer  Family History Breast cancer 1st degree relative <50  Social History: Reviewed history from 05/26/2009 and no changes required. etoh-occasionally Occupation: [employer] disability Current Smoker Alcohol use-yes Drug use-no Regular exercise-no widow  Review of Systems      See HPI  Physical Exam  General:  Well-developed,well-nourished,in no acute distress; alert,appropriate and cooperative throughout examination Abdomen:  Bowel sounds positive,abdomen soft and non-tender without masses, organomegaly or hernias noted. Skin:  Intact without suspicious lesions or rashes Psych:  Cognition and judgment appear intact. Alert and cooperative with normal attention span and concentration. No apparent delusions, illusions, hallucinations   Impression & Recommendations:  Problem # 1:  UTI (ICD-599.0)  Her updated medication list for this problem includes:    Cipro 500 Mg Tabs (Ciprofloxacin hcl) .Marland Kitchen... 1 by mouth two times a day  Orders: T-Culture, Urine ([id number])  Encouraged to push clear liquids, get enough rest, and take acetaminophen as needed. To be seen in 10 days if no improvement, sooner if worse.  Complete Medication List: 1)  Valium 5 Mg Tabs (Diazepam) .Marland Kitchen.. 1 by mouth at bedtime 2)  Lexapro 10 Mg Tabs (Escitalopram oxalate) .... 3 tab by mouth once daily 3)  Klonopin 2 Mg Tabs (Clonazepam) .Marland Kitchen.. 1 by mouth daily 4)  Trilipix 135 Mg Cpdr (Choline fenofibrate) .Marland Kitchen.. 1 by mouth daily. 5)  Oxycodone Hcl 15 Mg Tabs (Oxycodone hcl) .Marland Kitchen.. 1 tablet every 4 hours 6)  Cipro 500 Mg Tabs (Ciprofloxacin hcl) .Marland Kitchen.. 1 by mouth two times a day 7)  Elocon 0.1 % Crea (Mometasone  furoate) .... Apply once daily  Other Orders: Urology Referral (Urology) UA Dipstick w/o Micro (manual) ([address]) Prescriptions: ELOCON 0.1 % CREA (MOMETASONE FUROATE) apply once daily  #30g x 1   Entered and Authorized by:   Loreen Freud DO   Signed by:   Loreen Freud DO on 11/01/2009    Method used:   Electronically to        [facility] (retail)       [address]       [place], [place]  [address]       Ph: [phone]       Fax: [phone]   RxID:   [id number] CIPRO 500 MG TABS (CIPROFLOXACIN HCL) 1 by mouth two times a day  #10 x 0   Entered and Authorized by:   Loreen Freud DO   Signed by:   Loreen Freud DO on 11/01/2009   Method used:   Electronically to        [facility] (retail)       [address]       [place], [place]  [address]       Ph: [phone]       Fax: [phone]   RxID:   [id number]   Laboratory Results   Urine Tests    Routine Urinalysis   Color: yellow Appearance: Cloudy Glucose: negative   (Normal Range: Negative) Bilirubin: negative   (Normal Range: Negative) Ketone: trace (5)   (Normal Range: Negative) Spec. Gravity: <1.005   (Normal Range: 1.003-1.035) Blood: negative   (Normal Range: Negative) pH: 5.0   (Normal Range: 5.0-8.0) Protein: negative   (Normal Range: Negative) Urobilinogen: 0.2   (Normal Range: 0-1) Nitrite: positive   (Normal Range: Negative) Leukocyte Esterace: moderate   (Normal Range: Negative)

## 2010-04-04 NOTE — Letter (Signed)
Summary: [facility]   Imported By: Lanelle Bal 06/04/2009 11:21:31  _____________________________________________________________________  External Attachment:    Type:   Image     Comment:   External Document

## 2010-06-15 LAB — CBC
HCT: 38.8 % (ref 36.0–46.0)
Hemoglobin: 13.6 g/dL (ref 12.0–15.0)
RDW: 14.2 % (ref 11.5–15.5)

## 2010-07-18 NOTE — Op Note (Signed)
NAMEALBERTO, Ashley Salinas                  ACCOUNT NO.:  [id number]   MEDICAL RECORD NO.:  [id number]          PATIENT TYPE:  INP   LOCATION:  3172                         FACILITY:  [facility]   PHYSICIAN:  Donalee Citrin, M.D.        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   POSTOPERATIVE DIAGNOSES:  Not given.   PROCEDURE:  1. Redo decompressive laminectomy L4-5, posterior lumbar interbody      fusion L4-5 using a hybrid Telemond 10 x 22 mm PEEK cage.      __________  2. DBX bone substitute as well as Tangent allograft wedge 10 x 26 mm      pedicle screw fixation at L4-5 using the __________  legacy pedicle      screw system.  3. Posterolateral arthrodesis L4-5 using again locally harvested      autograft mixed with DBX bone substitute. __________  4. Open reduction spinal deformity.   SURGEON:  Donalee Citrin, MD.   ASSISTANT:  Tia Alert, MD.   ANESTHESIA:  General endotracheal.   HISTORY OF PRESENT ILLNESS:  The patient is a very pleasant, 55 year old  female who a few years ago underwent laminectomy for excision of  synovial cyst. She did very well for approximately 2-3 years and the  last several weeks or months had progressively worsening back and  bilateral leg pain, worse on the left. Repeat imaging showed a grade 1  spondylolisthesis at L4-5 with degenerative facet disease and severe  spinal stenosis in the 4 and the 5 roots due to the patient's  instability, malalignment and progressive pain syndrome with failure of  conservative treatment it was recommended redo decompressive laminectomy  and fusion. The risks and benefits of the operation were explained to  the patient __________ .   The patient was brought to the OR __________ back was prepped and draped  in the usual  sterile fashion. Her old incision was elliptici and opened  up and subperiosteal dissection was carried through the scar tissue  exposing the lamina of L3, the residual lamina of L4 and L5. The  __________  at L4 and L5 were then exposed and intraoperative __________  appropriate level. Then using 1 Penfield 4, the scar tissue was  dissected off the superior aspect of the L5 lamina and a plane was  developed underneath the superior aspect of the L5 lamina and this  laminotomy was extended a few bites inferior to gain access to the  normal dura. Then using gentle dissectors and Penfield's, the scar  tissue was dissected off of the dura and removed in a piecemeal fashion  with 3 mm Kerrison punches. 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 The  lateral foramina of L5 was identified  and unroofed and then flush with  the pedicle, decompressive laminotomy and fasciectomies were carried out  laterally all the way up to include and decompressing the 4 roots. The  L4 nerve root on the left was noted to be extensively involved in scar  tissue. The scar tissue was teased off of the 4 root and aggressive  foraminotomies were performed flush with the pedicles at L4. At the end  of the decompression, there was no further stenosis on either the 4 or  the 5 root. Attention was first taken to pedicle screw placement. Using  the high speed drill, a pilot hole was drilled at L4 on the left,  cannulated with the awl, probed, tapped with a 5.5 tap, probed again and  a 60 x 45 screw was inserted at L4 on the left. Intraoperative  fluoroscopy confirmed __________  using bony landmarks and direct  inspection within the canal as well as external __________ canal,  confirming the medial and lateral __________ . The L5 screw was inserted  in a similar fashion as well as  the L4 and L5 screws on the right. After  all four screws 65 x 45, after all had been placed and fluoroscopy  confirmed good positioning, attention taken to the interbody workup  __________  space was incised on the patient's right side. This was all  cleaned out and a size 10 distractor was inserted and this had good  apposition with the endplates and felt to be the appropriate size for  the graft. Then working on the left side, __________  dissected off the  4 and 5 root and the undersurface of the dura freeing up that  interspace. The interspace was cleaned out, there was noted to be  markedly degenerated disk complexes there and the large fragments were  removed both laterally and centrally. Then the left side was cut with a  size 10 cutter and chisel. The endplates were prepped, scraped and  __________ centrally and laterally decompressing the lateralized 4 root.  At this point, the cage was packed with local autograft mixed with DBX.  Starting on the patient's right side distractors removed __________  wedge. Then on the right side in a similar fashion, the interspace was  prepared, local autograft was packed against the cage and on the right  side a Tangent was inserted. After all the interbody workup was done,  the fluoroscopy confirmed good position and the wound was copiously  irrigated. Aggressive decortication was carried out in the TPs and the  lateral gutters. The remainder of the local autograft with DBX was  packed in the lateral gutters and then 40 mm rods were inserted  __________  the L4 pedicle screws compressed __________ inserted. All  foramina were reinspected and confirmed to be widely patent with no  migration of graft material and the interbody spaces again held good  position. Gelfoam was then __________ a medium Hemovac drain was placed  and the wound was closed in layers with interrupted Vicryl and running 4-  0 subcuticular with Benzoin and Steri-Strips. The  patient went to the  recovery room in stable condition. At the end of the case needle and  sponge count correct.           ______________________________  Donalee Citrin, M.D.     GC/MEDQ  D:  03/21/2007  T:  03/21/2007  Job:  [id number]

## 2010-07-18 NOTE — Op Note (Signed)
NAMEJENNIER, SCHISSLER                  ACCOUNT NO.:  [id number]   MEDICAL RECORD NO.:  [id number]          PATIENT TYPE:  INP   LOCATION:  3007                         FACILITY:  [facility]   PHYSICIAN:  Donalee Citrin, M.D.        DATE OF BIRTH:  1955-04-03   DATE OF PROCEDURE:  05/26/2008  DATE OF DISCHARGE:                               OPERATIVE REPORT   PREOPERATIVE DIAGNOSES:  1. 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  2. Bilateral L5 fracture with pedicle and pars.   PROCEDURES:  Re-exploration of lumbar fusion L4-5, removal of hardware  L4-5 in situ onlay fusion L5-S1 using bone morphogenetic protein, local  autograft mixed with Actifuse bone substitute, decompressive laminectomy  in excess of what is needed for standard interbody fusion L3-4,  posterior lumbar interbody fusion L3-4 using a hybrid Telamon 10 x 22 mm  PEEK cage, packed with local autograft mixed with Actifuse and a Tangent  allograft wedge, posterolateral arthrodesis L3-S1.   SURGEON:  Donalee Citrin, MD   ASSISTANT:  Dr. Yetta Barre.   ANESTHESIA:  General endotracheal.   HISTORY OF PRESENT ILLNESS:  The patient is a 55 year old female, who  underwent an L4-5 fusion over a year ago.  Initially, did very well.  However, followup x-rays and CT scan showed the fractures of the  inferior pedicle and pars at L5, but progressive over the last several  weeks to months and breakdown at L3-4 with left greater than right L4  radiculopathy radiating down the front of her shin.  The patient's MRI  scan and CT scan showed progressive degeneration and breakdown at L3-4  with a degenerative spondylolisthesis at L3-4 causing severe biforaminal  stenosis both roots at L3-4.  In addition, showed complete healing of  the inferior pars and inferior pedicle of L5 on the right, however,  incomplete healing of the pars and pedicle at L5 on the left.  It did  show solid bony fusion at  L4-5.  Due to the pars defects, but the  adequate fusion at L4-5, it was decided to explore the fusion at L4-5 to  lay additional bone graft mixed with BMP and Actifuse on the pars  defects in situ at L5-S1 with re-exploration of fusion at L4 to confirm  adequate fusion, decompression, and fusion at L3-4.  Risks and benefits  of the operation were all listed and explained to the patient, they  understood and agreed to proceed forth.   The patient was brought to the OR and was induced under general  anesthesia, positioned prone on the Wilson frame.  Back was prepped in  the usual sterile fashion.  Old incision was opened up and extended  cephalad.  The scar tissue was dissected free, the instrumentation was  identified.  The cross-link was removed, the nuts removed, and the rods  removed.  The fusion at L4-5 appeared to be solid, so the L5 screws were  removed.  The TPs and lateral gutters were opened up and removed  exposing the facet complex  at L5-S1 and the TPs at L3-4 and L5.  Then,  intraoperative x-ray confirmed localization at appropriate level of the  L3 pedicle.  The L3 spinous process was removed.  Extensive scar tissue  was dissected from the inferior aspect of the L3 lamina and then using 3-  and 4-mm Kerrison punch, complete central decompression was begun.  The  scar tissue was then also freed up laterally with complete medial  facetectomies performed at 3-4 and aggressive underbiting of the  superior articulate facet of L4 was then achieved to gain lateral  margins to the interspace.  There was noted marked epidural fibrosis and  scar tissue and biforaminal stenosis as well as hourglass compression of  thecal sac due to facet arthropathy L3-4.  There was also marked  diastasis of facet complex, it was predominantly on the right at L3-4  and incompetent facet tear.  There was also hypermobility at this level.  After complete and radical foraminotomies were then performed on  the L3  and L4 nerve roots bilaterally at L3-4, complete decompression was  completed.  The L3 screw was placed first on the right, power hole was  drilled with a high-speed drill, cannula with the awl probed, tapped  with a 5/5 pounds tap, probed again and a 6 x 45 screw inserted on the  right L3.  Fluoroscopy used each step along the way as well as probing  from within the pedicle as well as direct external and internal bony  landmarks were used.  The L3 screw on the left was inserted in a similar  fashion, was 6 x 45 screw inserted here.  Then, attention was taken to  interbody work, disk space was cleaned out first on the left side, 10  distractor was inserted.  This had good apposition of the endplates,  then working on the right with the D'Errico reflecting the right L4  nerve root medially, disk space was incised, significant degenerative  disk and central rupture was then removed in a piecemeal fashion.  A  size 10 cutter and chisel were used to prep the endplates.  Again,  fluoroscopy used at each step along the way, a Telamon PEEK cage, packed  with local autograft, mixed with Actifuse, then inserted on the right  side, distractor was removed on the left and in similar fashion, left  side was cleaned out, central disk was again removed.  A large amount of  local autograft mixed with Actifuse as well as a BMP sponge was then  packed anteriorly and centrally.  Then, a Tangent was inserted on the  left side.  After all interbody work were done, fluoroscopy confirmed  good position of the interbody spacers.  The wound was copiously  irrigated.  Meticulous hemostasis was maintained.  Aggressive  decortication was carried on TPs, lateral facet complexes from L3 down  to S1, BMP local autograft mixed with Actifuse was then the onlayed from  L5-S1 as well as posterior laterally from L3 down to L5.  After all this  was completed, 40-mm rods were placed and inserted from L3 to L4, the   top-tightening nuts tightened down at L4, the L3 screw was compressed  against L4.  A 422 cross-link was inserted.  All the neural foramina  were reinspected prior to confirming wide decompression.  Gelfoam was  laid on top of the dura.  A large Hemovac drain was placed.  The wound  was then closed in layers with interrupted Vicryl and the  skin was  closed with running 4-0 subcuticular.  Benzoin and Steri-Strips applied.  The patient went to recovery room in stable condition.  Postop  fluoroscopy confirmed good position of screws, rods, and bone grafts.           ______________________________  Donalee Citrin, M.D.     GC/MEDQ  D:  05/26/2008  T:  05/27/2008  Job:  [id number]

## 2010-07-18 NOTE — Assessment & Plan Note (Signed)
[facility]                            CARDIOLOGY OFFICE NOTE   NAME:Salinas, Ashley L                         MRN:          [id number]  DATE:06/19/2007                            DOB:          1956/01/17    CHIEF COMPLAINT:  I have fluid around my heart.   HISTORY OF PRESENT ILLNESS:  Ms. Ashley Salinas is a very pleasant 55 year old  white female who came to the office today because of an abnormal EKG and  need for a stress echo.  The echocardiogram showed a circumferential  moderate sized pericardial effusion with no evidence of hemodynamic  compromise.  Her EKG showed some nonspecific changes with some poor R  wave progression across the anterior precordium.  She has moderate  concentric hypertrophy, but no obstruction outflow tract.  She has  normal valvular function except for minimal mitral regurgitation and  tricuspid regurgitation.  Her right-sided structures appeared normal as  did her left and right atrium.   She has had no previous cardiac history.  She says she had an abnormal  EKG for several years.  She denies any chest discomfort whatsoever.  She  has no dyspnea on exertion.  She has had no orthopnea, PND or peripheral  edema.   She denies any recent chest trauma.  She did have some colds and sinus  congestion back in the winter.  Otherwise, she has had no major  infections.  She denies any major arthralgias, myalgias, fever, chills,  headache, early satiety, rash, painful digits, hemoptysis, hematemesis,  melena, hematochezia, GI symptoms.   She does smoke.  Her weight has been stable.  Recent blood work by Dr.  Laury Axon including a CBC and a comprehensive metabolic panel were normal.  TSH was also normal.   PAST MEDICAL HISTORY:  Intolerant to erythromycin.   CURRENT MEDICATIONS:  1. Percocet 5/325 q.6 h.  2. Valium 5 mg q.4 h.  3. Multivitamin.   PAST SURGICAL HISTORY:  She has had a lumbar fusion March 21, 2007.  She has had breast  augmentation.  She had a total hip replacement in  December 2006.  She had an appendectomy 1990, a small bowel resection in  1990, tubal ligation in 2004.   FAMILY HISTORY:  Her mother had breast cancer.   I did not ask about a recent mammogram.   SOCIAL HISTORY:  She is a widow.  She does not regularly exercise.  She  does drink alcohol.  She does smoke.  She smokes about a half pack of  cigarettes a day.   REVIEW OF SYSTEMS:  In addition to the above, all other review of  systems are negative.  She has no breast masses.   PHYSICAL EXAMINATION:  GENERAL:  She is very pleasant.  She is in no  acute distress.  VITAL SIGNS:  Blood pressure 112/82, pulse 60 and regular.  Her weight  is 146.  HEENT:  Normocephalic, atraumatic.  PERLA.  Extraocular is intact.  Sclerae are clear.  They are nonicteric.  Facial symmetry is normal.  Dentition satisfactory.  NECK:  Supple.  There is no lymphadenopathy.  There is no thyromegaly.  Carotid upstrokes were equal bilaterally without bruits, no JVD.  LUNGS:  Clear to auscultation.  There is no rub and no wheezes.  HEART:  Reveals regular rate and rhythm.  No rub, gallop.  S2 splits  physiologically.  ABDOMEN:  Soft, good bowel sounds.  No midline bruit.  No obvious  organomegaly.  EXTREMITIES:  No cyanosis, clubbing or edema.  Pulses are intact.  NEUROLOGICAL:  Exam is intact.  SKIN:  Unremarkable.   NOTE:  She has new breast implants.  These were recently evaluated by  her surgeon as well as Dr. Laury Axon.   ASSESSMENT:  1. Moderate to large pericardial effusion without hemodynamic      compromise.  She is totally asymptomatic.  Her history does not      allude to any particular etiology other perhaps a viral or      idiopathic pericardial effusion from some residual infection this      winter.  Because she is a smoker, we need to do a CT scan to rule      out any lung masses or continuous abnormalities in her chest.  2 . Her other history is  the history of breast cancer in her mother.  She had recent breast implants and examination.  Will confirm this and  reinforce when she returns post CT scan.   I have prescribed no medications and she is asymptomatic.  She will need  a follow-up echo in bed in about 6-8 weeks.     Thomas C. Daleen Squibb, MD, [facility]  Electronically Signed    TCW/MedQ  DD: 06/19/2007  DT: 06/19/2007  Job #: [id number]   cc:   Lelon Perla, DO

## 2010-07-18 NOTE — Assessment & Plan Note (Signed)
[facility]                            CARDIOLOGY OFFICE NOTE   NAME:Barb, Analena L                         MRN:          [id number]  DATE:06/25/2007                            DOB:          [birth year]    Ms. Yaun returns today for further management of her moderate to large  pericardial effusion, picked up on echocardiography.  She was totally  asymptomatic.  Because of her history of being a smoker, we did a CT  that day to rule out any intrathoracic pathology.  This showed a  moderate pericardial effusion, but no sign of cancer.  She has had  bilateral subglandular breast implants, and she had a small 6-mm lymph  node lateral to the right implant.  She states that her mother did have  breast cancer and she had mammography every year for 35 years.   She has no complaints today.   PHYSICAL EXAMINATION:  Blood pressure 100/78, pulse 78 and regular.  Weight is 142.  NECK:  There is no JVD.  LUNGS:  Clear.  No rub.  HEART:  Reveals a regular rate and rhythm.  No gallop and no rub.  ABDOMEN:  Soft, good bowel sounds.  EXTREMITIES:  There is no edema.  Pulses intact.   ASSESSMENT:  1. Asymptomatic pericardial effusion, probably idiopathic.  2. No evidence of lung carcinoma.  3. Tobacco use, for which I have asked her to discontinue today.   PLAN:  A follow-up 2-D echocardiogram in about 4-5 weeks.  If this is  negative for an effusion. We will see her back p.r.n.     Thomas C. Daleen Squibb, MD, [facility]  Electronically Signed    TCW/MedQ  DD: 06/25/2007  DT: 06/25/2007  Job #: [id number]   cc:   Lelon Perla, DO

## 2010-07-21 NOTE — Assessment & Plan Note (Signed)
[facility]                                 ON-CALL NOTE   NAME:Quinnell, Bryn L                         MRN:          [id number]  DATE:09/06/2007                            DOB:          03-03-1956    TIME RECEIVED:  8:13 a.m.   CALLER:  Rosanne Sack.  She sees Dr.  Laury Axon   TELEPHONE:  [phone]   The patient states that she feel she has another urinary tract  infection.  She gets these frequently and has the usual symptoms.  For  the last 2 days, she has had some urgency to urinate, some mild low back  pain, and a very foul odor to her urine.  There has been no vomiting and  no fever.  She is drinking lots of water.  My answer is to call in Cipro  500 mg to take b.i.d. for 7 days to Karin Golden at [phone].  The  patient can then follow up as needed.     Tera Mater. Clent Ridges, MD  Electronically Signed    SAF/MedQ  DD: 09/06/2007  DT: 09/06/2007  Job #: [id number]

## 2010-07-21 NOTE — Op Note (Signed)
NAME:  Ashley Salinas, Ashley Salinas                  ACCOUNT NO.:  [id number]   MEDICAL RECORD NO.:  [id number]          PATIENT TYPE:  AMB   LOCATION:  SDS                          FACILITY:  [facility]   PHYSICIAN:  Donalee Citrin, M.D.        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   PROCEDURE:  Bilateral decompressive lumbar laminectomy for dissection of  left paramedian synovial cyst, microscopic dissection of the left L5 nerve  root, and microscopic cystectomy.   SURGEON:  Donalee Citrin, M.D.   ASSISTANT:  Reinaldo Meeker, M.D.   ANESTHESIA:  General endotracheal.   FINDINGS:  A very large synovial cyst taking up about 60% of the spinal  canal, causing severe compression of the thecal sac.   INDICATIONS FOR PROCEDURE:  The patient is a very pleasant 55 year old  female who has longstanding back and leg pain getting progressive worse over  the last several weeks and months.  Prior imaging showed a very large, what  appeared to be synovial cyst causing severe spinal stenosis on the thecal  sac and both L5 nerve roots.  The patient had progressive worsening  claudication, and due to the size and location of the cyst and the patient's  clinical exam, the patient was recommended laminectomy and resection of the  synovial cyst.  The risks and benefits were explained.  She understands and  agrees.   DESCRIPTION OF PROCEDURE:  The patient was brought to the OR, was induced  general anesthesia and positioned prone on the Wilson frame.  Preoperatively, we localized the L4-5 disk space.  A midline incision was  made after infiltration of 10 cc of lidocaine with epinephrine.   Bovie  electrocautery was used to take down the subcutaneous tissues, and  subperiosteal dissection was carried out of the lamina of L4 and L5  bilaterally.  A self-retaining retractor was placed.  Intraoperative x-ray  confirmed localization of the L4-5 disk space.  The sinus process was then  removed, and the central canal was decompressed, and partial, about one-  third, of the right L4 lamina was removed to gain access to the thecal sac  and then the laminectomy was extended out to the left side.  The cyst was  immediately identified, and then using a combination of __________ 4  Penfield dental dissectors and microdissection techniques, the cyst was  freed up from the lateral dural margin.  In addition, the laminectomy was  extended cephalad and caudal to get to the inferior poles of the cyst.  Once  the cyst was completely identified and freed up from the lateral facet  complex, this was done __________ with a 2 Kerrison and cyst was entered,  and a milky substance the texture of cottage cheese was immediately  expressed.  This was removed in piecemeal fashion, and the cyst wall was  resected off of the medial dura with microdissection techniques in its  entirety.  Then the lateral cyst wall underneath the facet complex was  scraped with a nerve hook and 1 Penfield, and several additional residual  layers of the membrane were removed from the lateral facet complex.  Then  the membrane that was overlying the disk space was coagulated and resected.  At the end of the cystectomy, there was no further cyst membrane or wall  appreciated.  The dural margin was cleaned.  The 5 root was completely  decompressed, and the thecal sac had no further stenosis.  The wound was  then copiously irrigated, and meticulous hemostasis was maintained.  Gelfoam  was placed on top of the dura.  The muscle fascia was closed in layers with  interrupted Vicryl with running subcuticular for the skin.  Benzoin  and  Steri-Strips were applied.   The patient was taken to the recovery room in stable condition.           ______________________________  Donalee Citrin, M.D.     GC/MEDQ  D:  10/27/2004  T:  10/29/2004  Job:  [id number]

## 2010-07-21 NOTE — Discharge Summary (Signed)
[name], Ashley Salinas                  ACCOUNT NO.:  [id number]   MEDICAL RECORD NO.:  [id number]          PATIENT TYPE:  INP   LOCATION:  3015                         FACILITY:  [facility]   PHYSICIAN:  Donalee Citrin, M.D.        DATE OF BIRTH:  Jul 17, 1955   DATE OF ADMISSION:  03/21/2007  DATE OF DISCHARGE:  03/26/2007                               DISCHARGE SUMMARY   ADMISSION DIAGNOSES:  Degenerative disk disease with degenerative  spondylolisthesis at L4-5.   PROCEDURE:  During this hospitalization was redo decompressive  laminectomy and posterior lumbar interbody fusion, L4-5.   HISTORY OF PRESENT ILLNESS:  The patient is a very pleasant, 55 year old  female who a couple of years ago had undergone laminectomy for resection  of synovial cyst. The patient had developed progressively worsening leg  pain, and repeat imaging showed degenerative spondylolisthesis at L4-5  with severe spinal stenosis. The patient was recommended surgery. The  patient was admitted and underwent the aforementioned procedure.  Postoperatively, the patient did very well, went to the recovery room  and then to the floor. On the floor, the patient had complete resolution  of her leg pain and was progressively mobilized. Her back came under  much better control, over the next couple of days, the patient was  cleared from physical therapy for discharge with scheduled follow up. At  the time of discharge, the patient was ambulating and voiding  spontaneously, tolerating a regular diet, with complete resolution of  preoperative leg pain.           ______________________________  Donalee Citrin, M.D.     GC/MEDQ  D:  04/16/2007  T:  04/17/2007  Job:  [id number]

## 2010-08-08 ENCOUNTER — Encounter: Payer: Self-pay | Admitting: Family Medicine

## 2010-08-18 ENCOUNTER — Encounter: Payer: Self-pay | Admitting: Family Medicine

## 2010-08-26 ENCOUNTER — Encounter: Payer: Self-pay | Admitting: Family Medicine

## 2010-09-04 ENCOUNTER — Ambulatory Visit (INDEPENDENT_AMBULATORY_CARE_PROVIDER_SITE_OTHER): Payer: Medicare Other | Admitting: Family Medicine

## 2010-09-04 ENCOUNTER — Other Ambulatory Visit (HOSPITAL_COMMUNITY)
Admission: RE | Admit: 2010-09-04 | Discharge: 2010-09-04 | Disposition: A | Discharge status: Home / Self Care | Payer: Medicare Other | Source: Ambulatory Visit | Attending: Family Medicine | Admitting: Family Medicine

## 2010-09-04 ENCOUNTER — Encounter: Payer: Self-pay | Admitting: Family Medicine

## 2010-09-04 VITALS — BP 100/76 | HR 77 | Temp 99.0°F | Resp 18 | Ht 66.0 in | Wt 179.0 lb

## 2010-09-04 DIAGNOSIS — Z136 Encounter for screening for cardiovascular disorders: Secondary | ICD-10-CM

## 2010-09-04 DIAGNOSIS — N390 Urinary tract infection, site not specified: Secondary | ICD-10-CM

## 2010-09-04 DIAGNOSIS — Z79899 Other long term (current) drug therapy: Secondary | ICD-10-CM

## 2010-09-04 DIAGNOSIS — Z0000 Encounter for general adult medical examination without abnormal findings: Secondary | ICD-10-CM

## 2010-09-04 DIAGNOSIS — Z01419 Encounter for gynecological examination (general) (routine) without abnormal findings: Secondary | ICD-10-CM | POA: Insufficient documentation

## 2010-09-04 LAB — HEPATIC FUNCTION PANEL
ALT: 21 U/L (ref 0–35)
AST: 29 U/L (ref 0–37)
Albumin: 4.5 g/dL (ref 3.5–5.2)
Total Bilirubin: 0.6 mg/dL (ref 0.3–1.2)
Total Protein: 6.8 g/dL (ref 6.0–8.3)

## 2010-09-04 LAB — BASIC METABOLIC PANEL
BUN: 14 mg/dL (ref 6–23)
Chloride: 109 mEq/L (ref 96–112)
GFR: 61.47 mL/min (ref >60.00)
Glucose, Bld: 69 mg/dL — ABNORMAL LOW (ref 70–99)
Potassium: 4.5 mEq/L (ref 3.5–5.1)
Sodium: 142 mEq/L (ref 135–145)

## 2010-09-04 LAB — CBC WITH DIFFERENTIAL/PLATELET
Eosinophils Relative: 1.1 % (ref 0.0–5.0)
HCT: 39.2 % (ref 36.0–46.0)
Hemoglobin: 13.4 g/dL (ref 12.0–15.0)
Lymphs Abs: 2 10*3/uL (ref 0.7–4.0)
MCV: 96.4 fl (ref 78.0–100.0)
Monocytes Absolute: 0.3 10*3/uL (ref 0.1–1.0)
Monocytes Relative: 5.7 % (ref 3.0–12.0)
Neutro Abs: 3.2 10*3/uL (ref 1.4–7.7)
Platelets: 209 10*3/uL (ref 150.0–400.0)
WBC: 5.6 10*3/uL (ref 4.5–10.5)

## 2010-09-04 LAB — LIPID PANEL
Cholesterol: 158 mg/dL (ref 0–200)
Triglycerides: 106 mg/dL (ref 0.0–149.0)

## 2010-09-04 LAB — TSH: TSH: 1.04 u[IU]/mL (ref 0.35–5.50)

## 2010-09-04 LAB — POCT URINALYSIS DIPSTICK
Bilirubin, UA: NEGATIVE
Blood, UA: NEGATIVE
Glucose, UA: NEGATIVE
Ketones, UA: NEGATIVE
Spec Grav, UA: 1.02
Urobilinogen, UA: 0.2

## 2010-09-04 MED ORDER — SULFAMETHOXAZOLE-TRIMETHOPRIM 800-160 MG PO TABS: 1.0000 | ORAL_TABLET | Freq: Two times a day (BID) | ORAL | Status: AC

## 2010-09-04 NOTE — Progress Notes (Signed)
Subjective:     Ashley Salinas is a 55 y.o. female and is here for a comprehensive physical exam. The patient reports no new problems.  Hip is bothering her but she has appointment with ortho. .  History   Social History  . Marital Status: Widowed    Spouse Name: N/A    Number of Children: N/A  . Years of Education: N/A   Occupational History  . disability    Social History Main Topics  . Smoking status: Current Everyday Smoker -- 0.5 packs/day for 35 years    Types: Cigarettes  . Smokeless tobacco: Never Used   Comment: cutting down slowly  . Alcohol Use: 0.6 oz/week    1 Glasses of wine per week     rare-- actually < 1x   . Drug Use: No  . Sexually Active: No   Other Topics Concern  . Not on file   Social History Narrative  . No narrative on file   Health Maintenance  Topic Date Due  . Pap Smear  12/09/1974  . Influenza Vaccine  12/04/2010  . Mammogram  11/05/2011  . Tetanus/tdap  12/24/2016  . Colonoscopy  08/04/2020    The following portions of the patient's history were reviewed and updated as appropriate: allergies, current medications, past family history, past medical history, past social history, past surgical history and problem list.  Review of Systems Review of Systems  Constitutional: Negative for activity change, appetite change and fatigue.  HENT: Negative for hearing loss, congestion, tinnitus and ear discharge.  dentist q70m Eyes: Negative for visual disturbance (see optho q1y -- vision corrected to 20/20 with glasses). Dr Hazle Quant Respiratory: Negative for cough, chest tightness and shortness of breath.   Cardiovascular: Negative for chest pain, palpitations and leg swelling.  Gastrointestinal: Negative for abdominal pain, diarrhea, constipation and abdominal distention.  Genitourinary: Negative for urgency, frequency, decreased urine volume and difficulty urinating.  Musculoskeletal: Negative for back pain, arthralgias and gait problem.  Skin: Negative  for color change, pallor and rash.  Neurological: Negative for dizziness, light-headedness, numbness and headaches.  Hematological: Negative for adenopathy. Does not bruise/bleed easily.  Psychiatric/Behavioral: Negative for suicidal ideas, confusion, sleep disturbance, self-injury, dysphoric mood, decreased concentration and agitation.       Objective:    BP 100/76  Pulse 77  Temp(Src) 99 F (37.2 C) (Oral)  Resp 18  Ht 5\' 6"  (1.676 m)  Wt 179 lb (81.194 kg)  BMI 28.89 kg/m2  SpO2 96% General appearance: alert, cooperative, appears stated age and no distress Head: Normocephalic, without obvious abnormality, atraumatic Eyes: conjunctivae/corneas clear. PERRL, EOM's intact. Fundi benign. Ears: normal TM's and external ear canals both ears Nose: Nares normal. Septum midline. Mucosa normal. No drainage or sinus tenderness. Throat: lips, mucosa, and tongue normal; teeth and gums normal Neck: no adenopathy, no carotid bruit, no JVD, supple, symmetrical, trachea midline and thyroid not enlarged, symmetric, no tenderness/mass/nodules Lungs: clear to auscultation bilaterally Breasts: normal appearance, no masses or tenderness Heart: regular rate and rhythm, S1, S2 normal, no murmur, click, rub or gallop Abdomen: soft, non-tender; bowel sounds normal; no masses,  no organomegaly Pelvic: cervix normal in appearance, external genitalia normal, no adnexal masses or tenderness, no cervical motion tenderness, rectovaginal septum normal, uterus normal size, shape, and consistency and vagina normal without discharge Extremities: extremities normal, atraumatic, no cyanosis or edema Pulses: 2+ and symmetric Skin: Skin color, texture, turgor normal. No rashes or lesions Lymph nodes: Cervical, supraclavicular, and axillary nodes normal. Neurologic:  Grossly normal psych--no depression, no anxiety,  not suicidal    Assessment:    Healthy female exam.     chronic pain  Anxiety Plan:    ghm  utd Check fasting labs con't f/u with pain management and Neuro F/u ortho for knee/ hip See After Visit Summary for Counseling Recommendations

## 2010-09-04 NOTE — Patient Instructions (Signed)

## 2010-10-02 ENCOUNTER — Encounter (INDEPENDENT_AMBULATORY_CARE_PROVIDER_SITE_OTHER): Payer: Medicare Other | Admitting: Family Medicine

## 2010-10-02 ENCOUNTER — Encounter: Payer: Self-pay | Admitting: Family Medicine

## 2010-10-10 NOTE — Progress Notes (Signed)
This encounter was created in error - please disregard.

## 2010-11-23 LAB — TYPE AND SCREEN
ABO/RH(D): A POS
Antibody Screen: NEGATIVE

## 2010-11-23 LAB — CBC
MCV: 94.7
Platelets: 299
RBC: 5.39 — ABNORMAL HIGH
WBC: 6.6

## 2010-11-23 LAB — BASIC METABOLIC PANEL
BUN: 13
Calcium: 10
Chloride: 104
Creatinine, Ser: 0.87
GFR calc Af Amer: >60

## 2010-11-23 LAB — ABO/RH: ABO/RH(D): A POS

## 2010-12-22 ENCOUNTER — Encounter: Payer: Self-pay | Admitting: Family Medicine

## 2011-03-09 ENCOUNTER — Telehealth: Payer: Self-pay | Admitting: Family Medicine

## 2011-03-09 ENCOUNTER — Ambulatory Visit (INDEPENDENT_AMBULATORY_CARE_PROVIDER_SITE_OTHER): Payer: Medicare Other | Admitting: Family Medicine

## 2011-03-09 ENCOUNTER — Encounter: Payer: Self-pay | Admitting: Family Medicine

## 2011-03-09 VITALS — BP 110/80 | HR 100 | Temp 98.5°F | Wt 172.0 lb

## 2011-03-09 DIAGNOSIS — R21 Rash and other nonspecific skin eruption: Secondary | ICD-10-CM

## 2011-03-09 MED ORDER — METHYLPREDNISOLONE ACETATE PF 80 MG/ML IJ SUSP: 80.0000 mg | Freq: Once | INTRAMUSCULAR | Status: DC

## 2011-03-09 MED ORDER — METHYLPREDNISOLONE ACETATE 80 MG/ML IJ SUSP
80.0000 mg | Freq: Once | INTRAMUSCULAR | Status: AC
  Administered 2011-03-09: 80 mg via INTRAMUSCULAR

## 2011-03-09 MED ORDER — PREDNISONE 10 MG PO TABS: ORAL_TABLET | ORAL | Status: DC

## 2011-03-09 NOTE — Telephone Encounter (Signed)
Spoke with pt per her rash has worsened and she now is in her scalp, pt advised that she has had the issue before and received a cortisone shot, however the urgent care at [facility] did not give her the shot, set pt up for appt today 03-09-11 at 2:30pm pt understood and accepted appt

## 2011-03-09 NOTE — Progress Notes (Signed)
  Subjective:     Ashley Salinas is a 56 y.o. female who presents for evaluation of a rash involving the entire body head to toe. Rash started several days ago. Lesions are pink in color, and raised in texture. Rash worsened changed over time. Associated symptoms: none. Patient denies: fevers, arthralgias etc. Patient has not had contacts with similar rash. Patient has had new exposures (soaps, lotions, laundry detergents, foods, medications, plants, insects or animals).---- new sweater and new lotion.  Pt had recent multiple hip replacements.   Everyone was hesitant to give her steroids secondary to surgies----we d/w ortho and they are ok with [place] given steroid injection today---- pt is completely miserable.  Review of Systems As above Objective:    BP 110/80  Pulse 100  Temp(Src) 98.5 F (36.9 C) (Oral)  Wt 172 lb (78.019 kg)  SpO2 97% General:  [id number]  And is very uncomfortable  Skin:   + papular rash face to feet --+ escoriations     Assessment:    + contact derm--- ? etiology   Plan:    + depomedrol pred taper Benadryl rto prn-- consider derm if no improvement

## 2011-03-09 NOTE — Telephone Encounter (Signed)
Patient would like to see the doctor today, she went to [facility] 03/07/11, they prescribed betamethazone lotion and she has a rash all over her body, please contact patient as that there are no appts avail. Thanks

## 2011-03-09 NOTE — Patient Instructions (Signed)
Contact Dermatitis Contact dermatitis is a reaction to certain substances that touch the skin. Contact dermatitis can be either irritant contact dermatitis or allergic contact dermatitis. Irritant contact dermatitis does not require previous exposure to the substance for a reaction to occur. Allergic contact dermatitis only occurs if you have been exposed to the substance before. Upon a repeat exposure, your body reacts to the substance.   CAUSES   Many substances can cause contact dermatitis. Irritant dermatitis is most commonly caused by repeated exposure to mildly irritating substances, such as:  Makeup.     Soaps.    Detergents.    Bleaches.    Acids.    Metal salts, such as nickel.  Allergic contact dermatitis is most commonly caused by exposure to:  Poisonous plants.     Chemicals (deodorants, shampoos).     Jewelry.    Latex.    Neomycin in triple antibiotic cream.     Preservatives in products, including clothing.  SYMPTOMS   The area of skin that is exposed may develop:  Dryness or flaking.     Redness.    Cracks.    Itching.    Pain or a burning sensation.     Blisters.  With allergic contact dermatitis, there may also be swelling in areas such as the eyelids, mouth, or genitals.   DIAGNOSIS   Your caregiver can usually tell what the problem is by doing a physical exam. In cases where the cause is uncertain and an allergic contact dermatitis is suspected, a patch skin test may be performed to help determine the cause of your dermatitis. TREATMENT Treatment includes protecting the skin from further contact with the irritating substance by avoiding that substance if possible. Barrier creams, powders, and gloves may be helpful. Your caregiver may also recommend:  Steroid creams or ointments applied 2 times daily. For best results, soak the rash area in cool water for 20 minutes. Then apply the medicine. Cover the area with a plastic wrap. You can store the  steroid cream in the refrigerator for a "chilly" effect on your rash. That may decrease itching. Oral steroid medicines may be needed in more severe cases.     Antibiotics or antibacterial ointments if a skin infection is present.     Antihistamine lotion or an antihistamine taken by mouth to ease itching.     Lubricants to keep moisture in your skin.     Burow's solution to reduce redness and soreness or to dry a weeping rash. Mix one packet or tablet of solution in 2 cups cool water. Dip a clean washcloth in the mixture, wring it out a bit, and put it on the affected area. Leave the cloth in place for 30 minutes. Do this as often as possible throughout the day.     Taking several cornstarch or baking soda baths daily if the area is too large to cover with a washcloth.  Harsh chemicals, such as alkalis or acids, can cause skin damage that is like a burn. You should flush your skin for 15 to 20 minutes with cold water after such an exposure. You should also seek immediate medical care after exposure. Bandages (dressings), antibiotics, and pain medicine may be needed for severely irritated skin.   HOME CARE INSTRUCTIONS  Avoid the substance that caused your reaction.     Keep the area of skin that is affected away from hot water, soap, sunlight, chemicals, acidic substances, or anything else that would irritate your skin.       Do not scratch the rash. Scratching may cause the rash to become infected.     You may take cool baths to help stop the itching.     Only take over-the-counter or prescription medicines as directed by your caregiver.     See your caregiver for follow-up care as directed to make sure your skin is healing properly.  SEEK MEDICAL CARE IF:    Your condition is not better after 3 days of treatment.     You seem to be getting worse.     You see signs of infection such as swelling, tenderness, redness, soreness, or warmth in the affected area.     You have any problems  related to your medicines.  Document Released: 02/17/2000 Document Revised: 11/01/2010 Document Reviewed: 07/25/2010 [employer] Patient Information 2012 [employer]. 

## 2011-09-21 ENCOUNTER — Encounter: Payer: Self-pay | Admitting: Family Medicine

## 2011-09-21 ENCOUNTER — Ambulatory Visit (INDEPENDENT_AMBULATORY_CARE_PROVIDER_SITE_OTHER): Payer: Medicare Other | Admitting: Family Medicine

## 2011-09-21 ENCOUNTER — Other Ambulatory Visit (HOSPITAL_COMMUNITY)
Admission: RE | Admit: 2011-09-21 | Discharge: 2011-09-21 | Disposition: A | Discharge status: Home / Self Care | Payer: Medicare Other | Source: Ambulatory Visit | Attending: Family Medicine | Admitting: Family Medicine

## 2011-09-21 VITALS — BP 110/70 | HR 62 | Temp 98.5°F | Ht 67.0 in | Wt 151.6 lb

## 2011-09-21 DIAGNOSIS — Z79899 Other long term (current) drug therapy: Secondary | ICD-10-CM

## 2011-09-21 DIAGNOSIS — Z0000 Encounter for general adult medical examination without abnormal findings: Secondary | ICD-10-CM

## 2011-09-21 DIAGNOSIS — Z136 Encounter for screening for cardiovascular disorders: Secondary | ICD-10-CM

## 2011-09-21 DIAGNOSIS — Z124 Encounter for screening for malignant neoplasm of cervix: Secondary | ICD-10-CM

## 2011-09-21 DIAGNOSIS — N390 Urinary tract infection, site not specified: Secondary | ICD-10-CM

## 2011-09-21 DIAGNOSIS — Z01419 Encounter for gynecological examination (general) (routine) without abnormal findings: Secondary | ICD-10-CM | POA: Insufficient documentation

## 2011-09-21 LAB — CBC WITH DIFFERENTIAL/PLATELET
Basophils Relative: 0.4 % (ref 0.0–3.0)
HCT: 44.8 % (ref 36.0–46.0)
Hemoglobin: 14.7 g/dL (ref 12.0–15.0)
Lymphocytes Relative: 29.2 % (ref 12.0–46.0)
Lymphs Abs: 2 10*3/uL (ref 0.7–4.0)
MCHC: 32.9 g/dL (ref 30.0–36.0)
Monocytes Relative: 6.4 % (ref 3.0–12.0)
Neutro Abs: 4.3 10*3/uL (ref 1.4–7.7)
RBC: 4.64 Mil/uL (ref 3.87–5.11)

## 2011-09-21 LAB — BASIC METABOLIC PANEL
CO2: 24 mEq/L (ref 19–32)
Calcium: 9.6 mg/dL (ref 8.4–10.5)
GFR: 84.04 mL/min (ref >60.00)
Potassium: 4.4 mEq/L (ref 3.5–5.1)
Sodium: 140 mEq/L (ref 135–145)

## 2011-09-21 LAB — HEPATIC FUNCTION PANEL
ALT: 18 U/L (ref 0–35)
AST: 27 U/L (ref 0–37)
Albumin: 4.7 g/dL (ref 3.5–5.2)
Alkaline Phosphatase: 47 U/L (ref 39–117)
Total Protein: 7.9 g/dL (ref 6.0–8.3)

## 2011-09-21 LAB — POCT URINALYSIS DIPSTICK
Bilirubin, UA: NEGATIVE
Blood, UA: NEGATIVE
Nitrite, UA: POSITIVE
Protein, UA: NEGATIVE
Urobilinogen, UA: 0.2
pH, UA: 6

## 2011-09-21 MED ORDER — NAPROXEN 500 MG PO TABS: 500.0000 mg | ORAL_TABLET | Freq: Two times a day (BID) | ORAL | Status: DC

## 2011-09-21 NOTE — Patient Instructions (Signed)
Preventive Care for Adults, Female A healthy lifestyle and preventive care can promote health and wellness. Preventive health guidelines for women include the following key practices.  A routine yearly physical is a good way to check with your caregiver about your health and preventive screening. It is a chance to share any concerns and updates on your health, and to receive a thorough exam.   Visit your dentist for a routine exam and preventive care every 6 months. Brush your teeth twice a day and floss once a day. Good oral hygiene prevents tooth decay and gum disease.   The frequency of eye exams is based on your age, health, family medical history, use of contact lenses, and other factors. Follow your caregiver's recommendations for frequency of eye exams.   Eat a healthy diet. Foods like vegetables, fruits, whole grains, low-fat dairy products, and lean protein foods contain the nutrients you need without too many calories. Decrease your intake of foods high in solid fats, added sugars, and salt. Eat the right amount of calories for you.Get information about a proper diet from your caregiver, if necessary.   Regular physical exercise is one of the most important things you can do for your health. Most adults should get at least 150 minutes of moderate-intensity exercise (any activity that increases your heart rate and causes you to sweat) each week. In addition, most adults need muscle-strengthening exercises on 2 or more days a week.   Maintain a healthy weight. The body mass index (BMI) is a screening tool to identify possible weight problems. It provides an estimate of body fat based on height and weight. Your caregiver can help determine your BMI, and can help you achieve or maintain a healthy weight.For adults 20 years and older:   A BMI below 18.5 is considered underweight.   A BMI of 18.5 to 24.9 is normal.   A BMI of 25 to 29.9 is considered overweight.   A BMI of 30 and above is  considered obese.   Maintain normal blood lipids and cholesterol levels by exercising and minimizing your intake of saturated fat. Eat a balanced diet with plenty of fruit and vegetables. Blood tests for lipids and cholesterol should begin at age 20 and be repeated every 5 years. If your lipid or cholesterol levels are high, you are over 50, or you are at high risk for heart disease, you may need your cholesterol levels checked more frequently.Ongoing high lipid and cholesterol levels should be treated with medicines if diet and exercise are not effective.   If you smoke, find out from your caregiver how to quit. If you do not use tobacco, do not start.   If you are pregnant, do not drink alcohol. If you are breastfeeding, be very cautious about drinking alcohol. If you are not pregnant and choose to drink alcohol, do not exceed 1 drink per day. One drink is considered to be 12 ounces (355 mL) of beer, 5 ounces (148 mL) of wine, or 1.5 ounces (44 mL) of liquor.   Avoid use of street drugs. Do not share needles with anyone. Ask for help if you need support or instructions about stopping the use of drugs.   High blood pressure causes heart disease and increases the risk of stroke. Your blood pressure should be checked at least every 1 to 2 years. Ongoing high blood pressure should be treated with medicines if weight loss and exercise are not effective.   If you are 55 to 55   years old, ask your caregiver if you should take aspirin to prevent strokes.   Diabetes screening involves taking a blood sample to check your fasting blood sugar level. This should be done once every 3 years, after age 45, if you are within normal weight and without risk factors for diabetes. Testing should be considered at a younger age or be carried out more frequently if you are overweight and have at least 1 risk factor for diabetes.   Breast cancer screening is essential preventive care for women. You should practice "breast  self-awareness." This means understanding the normal appearance and feel of your breasts and may include breast self-examination. Any changes detected, no matter how small, should be reported to a caregiver. Women in their 20s and 30s should have a clinical breast exam (CBE) by a caregiver as part of a regular health exam every 1 to 3 years. After age 40, women should have a CBE every year. Starting at age 40, women should consider having a mammography (breast X-ray test) every year. Women who have a family history of breast cancer should talk to their caregiver about genetic screening. Women at a high risk of breast cancer should talk to their caregivers about having magnetic resonance imaging (MRI) and a mammography every year.   The Pap test is a screening test for cervical cancer. A Pap test can show cell changes on the cervix that might become cervical cancer if left untreated. A Pap test is a procedure in which cells are obtained and examined from the lower end of the uterus (cervix).   Women should have a Pap test starting at age 21.   Between ages 21 and 29, Pap tests should be repeated every 2 years.   Beginning at age 30, you should have a Pap test every 3 years as long as the past 3 Pap tests have been normal.   Some women have medical problems that increase the chance of getting cervical cancer. Talk to your caregiver about these problems. It is especially important to talk to your caregiver if a new problem develops soon after your last Pap test. In these cases, your caregiver may recommend more frequent screening and Pap tests.   The above recommendations are the same for women who have or have not gotten the vaccine for human papillomavirus (HPV).   If you had a hysterectomy for a problem that was not cancer or a condition that could lead to cancer, then you no longer need Pap tests. Even if you no longer need a Pap test, a regular exam is a good idea to make sure no other problems are  starting.   If you are between ages 65 and 70, and you have had normal Pap tests going back 10 years, you no longer need Pap tests. Even if you no longer need a Pap test, a regular exam is a good idea to make sure no other problems are starting.   If you have had past treatment for cervical cancer or a condition that could lead to cancer, you need Pap tests and screening for cancer for at least 20 years after your treatment.   If Pap tests have been discontinued, risk factors (such as a new sexual partner) need to be reassessed to determine if screening should be resumed.   The HPV test is an additional test that may be used for cervical cancer screening. The HPV test looks for the virus that can cause the cell changes on the cervix.   The cells collected during the Pap test can be tested for HPV. The HPV test could be used to screen women aged 30 years and older, and should be used in women of any age who have unclear Pap test results. After the age of 30, women should have HPV testing at the same frequency as a Pap test.   Colorectal cancer can be detected and often prevented. Most routine colorectal cancer screening begins at the age of 50 and continues through age 75. However, your caregiver may recommend screening at an earlier age if you have risk factors for colon cancer. On a yearly basis, your caregiver may provide home test kits to check for hidden blood in the stool. Use of a small camera at the end of a tube, to directly examine the colon (sigmoidoscopy or colonoscopy), can detect the earliest forms of colorectal cancer. Talk to your caregiver about this at age 50, when routine screening begins. Direct examination of the colon should be repeated every 5 to 10 years through age 75, unless early forms of pre-cancerous polyps or small growths are found.   Hepatitis C blood testing is recommended for all people born from 1945 through 1965 and any individual with known risks for hepatitis C.    Practice safe sex. Use condoms and avoid high-risk sexual practices to reduce the spread of sexually transmitted infections (STIs). STIs include gonorrhea, chlamydia, syphilis, trichomonas, herpes, HPV, and human immunodeficiency virus (HIV). Herpes, HIV, and HPV are viral illnesses that have no cure. They can result in disability, cancer, and death. Sexually active women aged 25 and younger should be checked for chlamydia. Older women with new or multiple partners should also be tested for chlamydia. Testing for other STIs is recommended if you are sexually active and at increased risk.   Osteoporosis is a disease in which the bones lose minerals and strength with aging. This can result in serious bone fractures. The risk of osteoporosis can be identified using a bone density scan. Women ages 65 and over and women at risk for fractures or osteoporosis should discuss screening with their caregivers. Ask your caregiver whether you should take a calcium supplement or vitamin D to reduce the rate of osteoporosis.   Menopause can be associated with physical symptoms and risks. Hormone replacement therapy is available to decrease symptoms and risks. You should talk to your caregiver about whether hormone replacement therapy is right for you.   Use sunscreen with sun protection factor (SPF) of 30 or more. Apply sunscreen liberally and repeatedly throughout the day. You should seek shade when your shadow is shorter than you. Protect yourself by wearing long sleeves, pants, a wide-brimmed hat, and sunglasses year round, whenever you are outdoors.   Once a month, do a whole body skin exam, using a mirror to look at the skin on your back. Notify your caregiver of new moles, moles that have irregular borders, moles that are larger than a pencil eraser, or moles that have changed in shape or color.   Stay current with required immunizations.   Influenza. You need a dose every fall (or winter). The composition of  the flu vaccine changes each year, so being vaccinated once is not enough.   Pneumococcal polysaccharide. You need 1 to 2 doses if you smoke cigarettes or if you have certain chronic medical conditions. You need 1 dose at age 65 (or older) if you have never been vaccinated.   Tetanus, diphtheria, pertussis (Tdap, Td). Get 1 dose of   Tdap vaccine if you are younger than age 65, are over 65 and have contact with an infant, are a healthcare worker, are pregnant, or simply want to be protected from whooping cough. After that, you need a Td booster dose every 10 years. Consult your caregiver if you have not had at least 3 tetanus and diphtheria-containing shots sometime in your life or have a deep or dirty wound.   HPV. You need this vaccine if you are a woman age 26 or younger. The vaccine is given in 3 doses over 6 months.   Measles, mumps, rubella (MMR). You need at least 1 dose of MMR if you were born in 1957 or later. You may also need a second dose.   Meningococcal. If you are age 19 to 21 and a first-year college student living in a residence hall, or have one of several medical conditions, you need to get vaccinated against meningococcal disease. You may also need additional booster doses.   Zoster (shingles). If you are age 60 or older, you should get this vaccine.   Varicella (chickenpox). If you have never had chickenpox or you were vaccinated but received only 1 dose, talk to your caregiver to find out if you need this vaccine.   Hepatitis A. You need this vaccine if you have a specific risk factor for hepatitis A virus infection or you simply wish to be protected from this disease. The vaccine is usually given as 2 doses, 6 to 18 months apart.   Hepatitis B. You need this vaccine if you have a specific risk factor for hepatitis B virus infection or you simply wish to be protected from this disease. The vaccine is given in 3 doses, usually over 6 months.  Preventive Services /  Frequency Ages 19 to 39  Blood pressure check.** / Every 1 to 2 years.   Lipid and cholesterol check.** / Every 5 years beginning at age 20.   Clinical breast exam.** / Every 3 years for women in their 20s and 30s.   Pap test.** / Every 2 years from ages 21 through 29. Every 3 years starting at age 30 through age 65 or 70 with a history of 3 consecutive normal Pap tests.   HPV screening.** / Every 3 years from ages 30 through ages 65 to 70 with a history of 3 consecutive normal Pap tests.   Hepatitis C blood test.** / For any individual with known risks for hepatitis C.   Skin self-exam. / Monthly.   Influenza immunization.** / Every year.   Pneumococcal polysaccharide immunization.** / 1 to 2 doses if you smoke cigarettes or if you have certain chronic medical conditions.   Tetanus, diphtheria, pertussis (Tdap, Td) immunization. / A one-time dose of Tdap vaccine. After that, you need a Td booster dose every 10 years.   HPV immunization. / 3 doses over 6 months, if you are 26 and younger.   Measles, mumps, rubella (MMR) immunization. / You need at least 1 dose of MMR if you were born in 1957 or later. You may also need a second dose.   Meningococcal immunization. / 1 dose if you are age 19 to 21 and a first-year college student living in a residence hall, or have one of several medical conditions, you need to get vaccinated against meningococcal disease. You may also need additional booster doses.   Varicella immunization.** / Consult your caregiver.   Hepatitis A immunization.** / Consult your caregiver. 2 doses, 6 to 18 months   apart.   Hepatitis B immunization.** / Consult your caregiver. 3 doses usually over 6 months.  Ages 40 to 64  Blood pressure check.** / Every 1 to 2 years.   Lipid and cholesterol check.** / Every 5 years beginning at age 20.   Clinical breast exam.** / Every year after age 40.   Mammogram.** / Every year beginning at age 40 and continuing for as  long as you are in good health. Consult with your caregiver.   Pap test.** / Every 3 years starting at age 30 through age 65 or 70 with a history of 3 consecutive normal Pap tests.   HPV screening.** / Every 3 years from ages 30 through ages 65 to 70 with a history of 3 consecutive normal Pap tests.   Fecal occult blood test (FOBT) of stool. / Every year beginning at age 50 and continuing until age 75. You may not need to do this test if you get a colonoscopy every 10 years.   Flexible sigmoidoscopy or colonoscopy.** / Every 5 years for a flexible sigmoidoscopy or every 10 years for a colonoscopy beginning at age 50 and continuing until age 75.   Hepatitis C blood test.** / For all people born from 1945 through 1965 and any individual with known risks for hepatitis C.   Skin self-exam. / Monthly.   Influenza immunization.** / Every year.   Pneumococcal polysaccharide immunization.** / 1 to 2 doses if you smoke cigarettes or if you have certain chronic medical conditions.   Tetanus, diphtheria, pertussis (Tdap, Td) immunization.** / A one-time dose of Tdap vaccine. After that, you need a Td booster dose every 10 years.   Measles, mumps, rubella (MMR) immunization. / You need at least 1 dose of MMR if you were born in 1957 or later. You may also need a second dose.   Varicella immunization.** / Consult your caregiver.   Meningococcal immunization.** / Consult your caregiver.   Hepatitis A immunization.** / Consult your caregiver. 2 doses, 6 to 18 months apart.   Hepatitis B immunization.** / Consult your caregiver. 3 doses, usually over 6 months.  Ages 65 and over  Blood pressure check.** / Every 1 to 2 years.   Lipid and cholesterol check.** / Every 5 years beginning at age 20.   Clinical breast exam.** / Every year after age 40.   Mammogram.** / Every year beginning at age 40 and continuing for as long as you are in good health. Consult with your caregiver.   Pap test.** /  Every 3 years starting at age 30 through age 65 or 70 with a 3 consecutive normal Pap tests. Testing can be stopped between 65 and 70 with 3 consecutive normal Pap tests and no abnormal Pap or HPV tests in the past 10 years.   HPV screening.** / Every 3 years from ages 30 through ages 65 or 70 with a history of 3 consecutive normal Pap tests. Testing can be stopped between 65 and 70 with 3 consecutive normal Pap tests and no abnormal Pap or HPV tests in the past 10 years.   Fecal occult blood test (FOBT) of stool. / Every year beginning at age 50 and continuing until age 75. You may not need to do this test if you get a colonoscopy every 10 years.   Flexible sigmoidoscopy or colonoscopy.** / Every 5 years for a flexible sigmoidoscopy or every 10 years for a colonoscopy beginning at age 50 and continuing until age 75.   Hepatitis   C blood test.** / For all people born from 1945 through 1965 and any individual with known risks for hepatitis C.   Osteoporosis screening.** / A one-time screening for women ages 65 and over and women at risk for fractures or osteoporosis.   Skin self-exam. / Monthly.   Influenza immunization.** / Every year.   Pneumococcal polysaccharide immunization.** / 1 dose at age 65 (or older) if you have never been vaccinated.   Tetanus, diphtheria, pertussis (Tdap, Td) immunization. / A one-time dose of Tdap vaccine if you are over 65 and have contact with an infant, are a healthcare worker, or simply want to be protected from whooping cough. After that, you need a Td booster dose every 10 years.   Varicella immunization.** / Consult your caregiver.   Meningococcal immunization.** / Consult your caregiver.   Hepatitis A immunization.** / Consult your caregiver. 2 doses, 6 to 18 months apart.   Hepatitis B immunization.** / Check with your caregiver. 3 doses, usually over 6 months.  ** Family history and personal history of risk and conditions may change your caregiver's  recommendations. Document Released: 04/17/2001 Document Revised: 02/08/2011 Document Reviewed: 07/17/2010 [employer] Patient Information 2012 [employer]. 

## 2011-09-21 NOTE — Progress Notes (Signed)
Subjective:    Ashley Salinas is a 56 y.o. female who presents for Medicare Annual/Subsequent preventive examination.  Preventive Screening-Counseling & Management  Tobacco History  Smoking status  . Former Smoker -- 0.5 packs/day for 35 years  . Types: Cigarettes  . Quit date: 10/04/2010  Smokeless tobacco  . Never Used  Comment: cutting down slowly     Problems Prior to Visit 1.   Current Problems (verified) Patient Active Problem List  Diagnosis  . TINEA CRURIS  . TRICHOMONAL VAGINITIS  . DEPRESSION  . Urinary tract infection, site not specified  . CALLUS, RIGHT FOOT  . DEGENERATIVE JOINT DISEASE, HIPS  . HIP PAIN, BILATERAL  . SYNOVIAL CYST  . ELECTROCARDIOGRAM, ABNORMAL    Medications Prior to Visit Current Outpatient Prescriptions on File Prior to Visit  Medication Sig Dispense Refill  . calcium carbonate (OS-CAL - DOSED IN MG OF ELEMENTAL CALCIUM) 1250 MG tablet Take 1 tablet by mouth daily.        Current Medications (verified) Current Outpatient Prescriptions  Medication Sig Dispense Refill  . Biotin 1000 MCG tablet Take 1,000 mcg by mouth 3 (three) times daily.      . calcium carbonate (OS-CAL - DOSED IN MG OF ELEMENTAL CALCIUM) 1250 MG tablet Take 1 tablet by mouth daily.      Marland Kitchen trimethoprim (TRIMPEX) 100 MG tablet Take 1 tablet by mouth daily.      . naproxen (NAPROSYN) 500 MG tablet Take 1 tablet (500 mg total) by mouth 2 (two) times daily with a meal.  60 tablet  2     Allergies (verified) Azithromycin   PAST HISTORY  Family History Family History  Problem Relation Age of Onset  . Breast cancer Mother   . Cancer Mother 29    breast  . Arthritis Father   . Cancer Father     multiple myeloma  stage 3B    Social History History  Substance Use Topics  . Smoking status: Former Smoker -- 0.5 packs/day for 35 years    Types: Cigarettes    Quit date: 10/04/2010  . Smokeless tobacco: Never Used   Comment: cutting down slowly  . Alcohol  Use: 0.6 oz/week    1 Glasses of wine per week     rare-- actually < 1x      Are there smokers in your home (other than you)? No  Risk Factors Current exercise habits: walking daily  Dietary issues discussed: na   Cardiac risk factors: none.  Depression Screen (Note: if answer to either of the following is "Yes", a more complete depression screening is indicated)   Over the past two weeks, have you felt down, depressed or hopeless? No  Over the past two weeks, have you felt little interest or pleasure in doing things? No  Have you lost interest or pleasure in daily life? No  Do you often feel hopeless? No  Do you cry easily over simple problems? No  Activities of Daily Living In your present state of health, do you have any difficulty performing the following activities?:  Driving? No Managing money?  No Feeding yourself? No Getting from bed to chair? No Climbing a flight of stairs? No Preparing food and eating?: No Bathing or showering? No Getting dressed: No Getting to the toilet? No Using the toilet:No Moving around from place to place: No In the past year have you fallen or had a near fall?:No   Are you sexually active?  Yes  Do you  have more than one partner?  No  Hearing Difficulties: No Do you often ask people to speak up or repeat themselves? No Do you experience ringing or noises in your ears? No Do you have difficulty understanding soft or whispered voices? No   Do you feel that you have a problem with memory? No  Do you often misplace items? No  Do you feel safe at home?  No  Cognitive Testing  Alert? Yes  Normal Appearance?Yes  Oriented to person? Yes  Place? Yes   Time? Yes  Recall of three objects?  Yes  Can perform simple calculations? Yes  Displays appropriate judgment?Yes  Can read the correct time from a watch face?Yes   Advanced Directives have been discussed with the patient? Yes  List the Names of Other Physician/Practitioners you  currently use: 1.  opth--Dr Hazle Quant 2 dentist-- [facility] works 3. Derm-- Danella Deis  Indicate any recent Medical Services you may have received from other than [facility] providers in the past year (date may be approximate).  Immunization History  Administered Date(s) Administered  . H1N1 01/20/2008  . Influenza Whole 12/25/2006, 12/17/2007, 01/20/2009  . Td 12/25/2006    Screening Tests Health Maintenance  Topic Date Due  . Influenza Vaccine  12/04/2011  . Mammogram  12/10/2012  . Pap Smear  09/21/2014  . Tetanus/tdap  12/24/2016  . Colonoscopy  08/04/2020    All answers were reviewed with the patient and necessary referrals were made:  Loreen Freud, DO   09/21/2011   History reviewed: allergies, current medications, past family history, past medical history, past social history, past surgical history and problem list  Review of Systems Review of Systems  Constitutional: Negative for activity change, appetite change and fatigue.  HENT: Negative for hearing loss, congestion, tinnitus and ear discharge.  dentist q25m Eyes: Negative for visual disturbance (see optho q1y -- vision corrected to 20/20 with glasses).  Respiratory: Negative for cough, chest tightness and shortness of breath.   Cardiovascular: Negative for chest pain, palpitations and leg swelling.  Gastrointestinal: Negative for abdominal pain, diarrhea, constipation and abdominal distention.  Genitourinary: Negative for urgency, frequency, decreased urine volume and difficulty urinating.  Musculoskeletal: Negative for back pain, arthralgias and gait problem.  Skin: Negative for color change, pallor and rash.  Neurological: Negative for dizziness, light-headedness, numbness and headaches.  Hematological: Negative for adenopathy. Does not bruise/bleed easily.  Psychiatric/Behavioral: Negative for suicidal ideas, confusion, sleep disturbance, self-injury, dysphoric mood, decreased concentration and agitation.        Objective:      Vision by Snellen chart:optho Body mass index is 23.74 kg/(m^2). BP 110/70  Pulse 62  Temp 98.5 F (36.9 C) (Oral)  Ht 5\' 7"  (1.702 m)  Wt 151 lb 9.6 oz (68.765 kg)  BMI 23.74 kg/m2  SpO2 96%  BP 110/70  Pulse 62  Temp 98.5 F (36.9 C) (Oral)  Ht 5\' 7"  (1.702 m)  Wt 151 lb 9.6 oz (68.765 kg)  BMI 23.74 kg/m2  SpO2 96% General appearance: alert, cooperative, appears stated age and no distress Head: Normocephalic, without obvious abnormality, atraumatic Eyes: conjunctivae/corneas clear. PERRL, EOM's intact. Fundi benign. Ears: normal TM's and external ear canals both ears Nose: Nares normal. Septum midline. Mucosa normal. No drainage or sinus tenderness. Throat: lips, mucosa, and tongue normal; teeth and gums normal Neck: no adenopathy, no carotid bruit, no JVD, supple, symmetrical, trachea midline and thyroid not enlarged, symmetric, no tenderness/mass/nodules Back: symmetric, no curvature. ROM normal. No CVA tenderness. Lungs: clear to  auscultation bilaterally Breasts: normal appearance, no masses or tenderness Heart: regular rate and rhythm, S1, S2 normal, no murmur, click, rub or gallop Abdomen: soft, non-tender; bowel sounds normal; no masses,  no organomegaly Pelvic: cervix normal in appearance, external genitalia normal, no adnexal masses or tenderness, no cervical motion tenderness, rectovaginal septum normal, uterus normal size, shape, and consistency and vagina normal without discharge Extremities: extremities normal, atraumatic, no cyanosis or edema Pulses: 2+ and symmetric Skin: Skin color, texture, turgor normal. No rashes or lesions Lymph nodes: Cervical, supraclavicular, and axillary nodes normal. Neurologic: Grossly normal Psych-- no depression, anxiety     Assessment:     cpe     Plan:    check labs ghm utd During the course of the visit the patient was educated and counseled about appropriate screening and preventive services including:     Td vaccine  Screening mammography  Screening Pap smear and pelvic exam   Bone densitometry screening  Colorectal cancer screening  Advanced directives: has an advanced directive - a copy HAS NOT been provided.  Diet review for nutrition referral? Yes ____  Not Indicated ___x_   Patient Instructions (the written plan) was given to the patient.  Medicare Attestation I have personally reviewed: The patient's medical and social history Their use of alcohol, tobacco or illicit drugs Their current medications and supplements The patient's functional ability including ADLs,fall risks, home safety risks, cognitive, and hearing and visual impairment Diet and physical activities Evidence for depression or mood disorders  The patient's weight, height, BMI, and visual acuity have been recorded in the chart.  I have made referrals, counseling, and provided education to the patient based on review of the above and I have provided the patient with a written personalized care plan for preventive services.     Loreen Freud, DO   09/21/2011

## 2011-09-21 NOTE — Addendum Note (Signed)
Addended by: Silvio Pate D on: 09/21/2011 02:33 PM   Modules accepted: Orders

## 2011-09-23 LAB — URINE CULTURE

## 2011-09-25 ENCOUNTER — Telehealth: Payer: Self-pay

## 2011-09-25 MED ORDER — CIPROFLOXACIN HCL 500 MG PO TABS: 500.0000 mg | ORAL_TABLET | Freq: Two times a day (BID) | ORAL | Status: AC

## 2011-09-25 NOTE — Telephone Encounter (Signed)
Message copied by Arnette Norris on Tue Sep 25, 2011  8:34 AM ------      Message from: [facility], FELICIA L      Created: Mon Sep 24, 2011  6:08 PM       Left message to call office

## 2011-09-25 NOTE — Telephone Encounter (Signed)
+   UTI--- cipro 500 mg bid for 5 days---- hold trimethoprim  Discussed with patient and Rx sent to the pharmacy, she agreed to hold trimethoprim while on Cipro.     KP

## 2012-01-25 ENCOUNTER — Encounter: Payer: Self-pay | Admitting: Family Medicine

## 2012-07-09 ENCOUNTER — Other Ambulatory Visit: Payer: Self-pay | Admitting: Family Medicine

## 2012-07-09 ENCOUNTER — Telehealth: Payer: Self-pay | Admitting: Family Medicine

## 2012-07-09 DIAGNOSIS — E2839 Other primary ovarian failure: Secondary | ICD-10-CM

## 2012-07-09 NOTE — Telephone Encounter (Signed)
Patient called requesting appt for bone density scan. This will be her first one. Pt prefers [facility].

## 2012-07-09 NOTE — Telephone Encounter (Signed)
Please advise      KP 

## 2012-07-09 NOTE — Telephone Encounter (Signed)
msg left to call the office     KP 

## 2012-07-09 NOTE — Telephone Encounter (Signed)
[facility] not due [facility]---- does she have a specific place

## 2012-07-09 NOTE — Telephone Encounter (Signed)
Order put in.

## 2012-07-21 ENCOUNTER — Other Ambulatory Visit: Payer: Self-pay | Admitting: Family Medicine

## 2012-07-21 NOTE — Telephone Encounter (Signed)
09-21-11 Last OV, last filled #[phone]

## 2012-09-22 ENCOUNTER — Encounter: Payer: Medicare Other | Admitting: Family Medicine

## 2012-10-13 ENCOUNTER — Ambulatory Visit (INDEPENDENT_AMBULATORY_CARE_PROVIDER_SITE_OTHER)
Admission: RE | Admit: 2012-10-13 | Discharge: 2012-10-13 | Disposition: A | Discharge status: Home / Self Care | Payer: Medicare Other | Source: Ambulatory Visit | Attending: Family Medicine | Admitting: Family Medicine

## 2012-10-13 DIAGNOSIS — E2839 Other primary ovarian failure: Secondary | ICD-10-CM

## 2012-10-30 ENCOUNTER — Encounter: Payer: Self-pay | Admitting: Family Medicine

## 2012-10-30 ENCOUNTER — Ambulatory Visit (INDEPENDENT_AMBULATORY_CARE_PROVIDER_SITE_OTHER): Payer: Medicare Other | Admitting: Family Medicine

## 2012-10-30 ENCOUNTER — Other Ambulatory Visit (HOSPITAL_COMMUNITY)
Admission: RE | Admit: 2012-10-30 | Discharge: 2012-10-30 | Disposition: A | Discharge status: Home / Self Care | Payer: Managed Care, Other (non HMO) | Source: Ambulatory Visit | Attending: Family Medicine | Admitting: Family Medicine

## 2012-10-30 VITALS — BP 114/76 | HR 76 | Temp 98.6°F | Ht 66.0 in | Wt 159.0 lb

## 2012-10-30 DIAGNOSIS — N390 Urinary tract infection, site not specified: Secondary | ICD-10-CM

## 2012-10-30 DIAGNOSIS — F329 Major depressive disorder, single episode, unspecified: Secondary | ICD-10-CM

## 2012-10-30 DIAGNOSIS — Z124 Encounter for screening for malignant neoplasm of cervix: Secondary | ICD-10-CM

## 2012-10-30 DIAGNOSIS — Z0000 Encounter for general adult medical examination without abnormal findings: Secondary | ICD-10-CM

## 2012-10-30 DIAGNOSIS — Z01419 Encounter for gynecological examination (general) (routine) without abnormal findings: Secondary | ICD-10-CM | POA: Insufficient documentation

## 2012-10-30 LAB — POCT URINALYSIS DIPSTICK
Bilirubin, UA: NEGATIVE
Blood, UA: NEGATIVE
Glucose, UA: NEGATIVE
Ketones, UA: NEGATIVE
Spec Grav, UA: 1.015
pH, UA: 6.5

## 2012-10-30 LAB — CBC WITH DIFFERENTIAL/PLATELET
Basophils Absolute: 0.1 10*3/uL (ref 0.0–0.1)
Basophils Relative: 1.1 % (ref 0.0–3.0)
Eosinophils Absolute: 0.1 10*3/uL (ref 0.0–0.7)
MCHC: 34.5 g/dL (ref 30.0–36.0)
MCV: 96.1 fl (ref 78.0–100.0)
Monocytes Absolute: 0.3 10*3/uL (ref 0.1–1.0)
Neutro Abs: 4.5 10*3/uL (ref 1.4–7.7)
Neutrophils Relative %: 68.9 % (ref 43.0–77.0)
RBC: 4.43 Mil/uL (ref 3.87–5.11)
RDW: 13.9 % (ref 11.5–14.6)

## 2012-10-30 NOTE — Progress Notes (Signed)
Subjective:     Ashley Salinas is a 57 y.o. female and is here for a comprehensive physical exam. The patient reports no problems.  History   Social History  . Marital Status: Widowed    Spouse Name: N/A    Number of Children: N/A  . Years of Education: N/A   Occupational History  . [employer] -- RN      psych floor locked unit   Social History Main Topics  . Smoking status: Former Smoker -- 0.50 packs/day for 35 years    Types: Cigarettes    Quit date: 10/04/2010  . Smokeless tobacco: Never Used  . Alcohol Use: 0.6 oz/week    1 Glasses of wine per week     Comment: rare-- actually < 1x   . Drug Use: No  . Sexual Activity: Yes    Partners: Male   Other Topics Concern  . Not on file   Social History Narrative   Exercise-  Walk daily   Health Maintenance  Topic Date Due  . Influenza Vaccine  10/03/2012  . Mammogram  12/11/2013  . Colonoscopy  08/05/2015  . Pap Smear  10/31/2015  . Tetanus/tdap  12/24/2016    The following portions of the patient's history were reviewed and updated as appropriate:  She  has a past medical history of Depression. She  does not have any pertinent problems on file. 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 Her family history includes Arthritis in her father; Breast cancer in her mother; Cancer in her father; Cancer (age of onset: 74) in her mother. She  reports that she quit smoking about 2 years ago. Her smoking use included Cigarettes. She has a 17.5 pack-year smoking history. She has never used smokeless tobacco. She reports that she drinks about 0.6 ounces of alcohol per week. She reports that she does not use illicit drugs. She has a current medication list which includes the following prescription(s): biotin, calcium  carbonate, and trimethoprim. Current Outpatient Prescriptions on File Prior to Visit  Medication Sig Dispense Refill  . Biotin 1000 MCG tablet Take 5,000 mcg by mouth daily.       . calcium carbonate (OS-CAL - DOSED IN MG OF ELEMENTAL CALCIUM) 1250 MG tablet Take 1 tablet by mouth daily.      Marland Kitchen trimethoprim (TRIMPEX) 100 MG tablet Take 1 tablet by mouth daily.       No current facility-administered medications on file prior to visit.   She is allergic to azithromycin..  Review of Systems Review of Systems  Constitutional: Negative for activity change, appetite change and fatigue.  HENT: Negative for hearing loss, congestion, tinnitus and ear discharge.  dentist q41m Eyes: Negative for visual disturbance (see optho q1y -- vision corrected to 20/20 with glasses).  Respiratory: Negative for cough, chest tightness and shortness of breath.   Cardiovascular: Negative for chest pain, palpitations and leg swelling.  Gastrointestinal: Negative for abdominal pain, diarrhea, constipation and abdominal distention.  Genitourinary: Negative for urgency, frequency, decreased urine volume and difficulty urinating.  Musculoskeletal: Negative for back pain, arthralgias and gait problem.  Skin: Negative for color change, pallor and rash.  Neurological: Negative for dizziness, light-headedness, numbness and headaches.  Hematological: Negative for adenopathy. Does not bruise/bleed easily.  Psychiatric/Behavioral: Negative for suicidal ideas, confusion, sleep disturbance, self-injury, dysphoric mood, decreased concentration  and agitation.       Objective:    BP 114/76  Pulse 76  Temp(Src) 98.6 F (37 C) (Oral)  Ht 5\' 6"  (1.676 m)  Wt 159 lb (72.122 kg)  BMI 25.68 kg/m2  SpO2 98% General appearance: alert, cooperative, appears stated age and no distress Head: Normocephalic, without obvious abnormality, atraumatic Eyes: conjunctivae/corneas clear. PERRL, EOM's intact. Fundi benign. Ears: normal  TM's and external ear canals both ears Nose: Nares normal. Septum midline. Mucosa normal. No drainage or sinus tenderness. Throat: lips, mucosa, and tongue normal; teeth and gums normal Neck: no adenopathy, no carotid bruit, no JVD, supple, symmetrical, trachea midline and thyroid not enlarged, symmetric, no tenderness/mass/nodules Back: symmetric, no curvature. ROM normal. No CVA tenderness. Lungs: clear to auscultation bilaterally Breasts: normal appearance, no masses or tenderness Heart: regular rate and rhythm, S1, S2 normal, no murmur, click, rub or gallop Abdomen: soft, non-tender; bowel sounds normal; no masses,  no organomegaly Pelvic: cervix normal in appearance, external genitalia normal, no adnexal masses or tenderness, no cervical motion tenderness, rectovaginal septum normal, uterus normal size, shape, and consistency and vagina normal without discharge pap done Extremities: extremities normal, atraumatic, no cyanosis or edema Pulses: 2+ and symmetric Skin: Skin color, texture, turgor normal. No rashes or lesions Lymph nodes: Cervical, supraclavicular, and axillary nodes normal. Neurologic: Alert and oriented X 3, normal strength and tone. Normal symmetric reflexes. Normal coordination and gait Psych- no depression, no anxiety      Assessment:    Healthy female exam.     Plan:    ghm utd Check labs See After Visit Summary for Counseling Recommendations

## 2012-10-30 NOTE — Patient Instructions (Addendum)
Preventive Care for Adults, Female A healthy lifestyle and preventive care can promote health and wellness. Preventive health guidelines for women include the following key practices.  A routine yearly physical is a good way to check with your caregiver about your health and preventive screening. It is a chance to share any concerns and updates on your health, and to receive a thorough exam.  Visit your dentist for a routine exam and preventive care every 6 months. Brush your teeth twice a day and floss once a day. Good oral hygiene prevents tooth decay and gum disease.  The frequency of eye exams is based on your age, health, family medical history, use of contact lenses, and other factors. Follow your caregiver's recommendations for frequency of eye exams.  Eat a healthy diet. Foods like vegetables, fruits, whole grains, low-fat dairy products, and lean protein foods contain the nutrients you need without too many calories. Decrease your intake of foods high in solid fats, added sugars, and salt. Eat the right amount of calories for you.Get information about a proper diet from your caregiver, if necessary.  Regular physical exercise is one of the most important things you can do for your health. Most adults should get at least 150 minutes of moderate-intensity exercise (any activity that increases your heart rate and causes you to sweat) each week. In addition, most adults need muscle-strengthening exercises on 2 or more days a week.  Maintain a healthy weight. The body mass index (BMI) is a screening tool to identify possible weight problems. It provides an estimate of body fat based on height and weight. Your caregiver can help determine your BMI, and can help you achieve or maintain a healthy weight.For adults 20 years and older:  A BMI below 18.5 is considered underweight.  A BMI of 18.5 to 24.9 is normal.  A BMI of 25 to 29.9 is considered overweight.  A BMI of 30 and above is  considered obese.  Maintain normal blood lipids and cholesterol levels by exercising and minimizing your intake of saturated fat. Eat a balanced diet with plenty of fruit and vegetables. Blood tests for lipids and cholesterol should begin at age 20 and be repeated every 5 years. If your lipid or cholesterol levels are high, you are over 50, or you are at high risk for heart disease, you may need your cholesterol levels checked more frequently.Ongoing high lipid and cholesterol levels should be treated with medicines if diet and exercise are not effective.  If you smoke, find out from your caregiver how to quit. If you do not use tobacco, do not start.  If you are pregnant, do not drink alcohol. If you are breastfeeding, be very cautious about drinking alcohol. If you are not pregnant and choose to drink alcohol, do not exceed 1 drink per day. One drink is considered to be 12 ounces (355 mL) of beer, 5 ounces (148 mL) of wine, or 1.5 ounces (44 mL) of liquor.  Avoid use of street drugs. Do not share needles with anyone. Ask for help if you need support or instructions about stopping the use of drugs.  High blood pressure causes heart disease and increases the risk of stroke. Your blood pressure should be checked at least every 1 to 2 years. Ongoing high blood pressure should be treated with medicines if weight loss and exercise are not effective.  If you are 55 to 56 years old, ask your caregiver if you should take aspirin to prevent strokes.  Diabetes   screening involves taking a blood sample to check your fasting blood sugar level. This should be done once every 3 years, after age 45, if you are within normal weight and without risk factors for diabetes. Testing should be considered at a younger age or be carried out more frequently if you are overweight and have at least 1 risk factor for diabetes.  Breast cancer screening is essential preventive care for women. You should practice "breast  self-awareness." This means understanding the normal appearance and feel of your breasts and may include breast self-examination. Any changes detected, no matter how small, should be reported to a caregiver. Women in their 20s and 30s should have a clinical breast exam (CBE) by a caregiver as part of a regular health exam every 1 to 3 years. After age 40, women should have a CBE every year. Starting at age 40, women should consider having a mammography (breast X-ray test) every year. Women who have a family history of breast cancer should talk to their caregiver about genetic screening. Women at a high risk of breast cancer should talk to their caregivers about having magnetic resonance imaging (MRI) and a mammography every year.  The Pap test is a screening test for cervical cancer. A Pap test can show cell changes on the cervix that might become cervical cancer if left untreated. A Pap test is a procedure in which cells are obtained and examined from the lower end of the uterus (cervix).  Women should have a Pap test starting at age 21.  Between ages 21 and 29, Pap tests should be repeated every 2 years.  Beginning at age 30, you should have a Pap test every 3 years as long as the past 3 Pap tests have been normal.  Some women have medical problems that increase the chance of getting cervical cancer. Talk to your caregiver about these problems. It is especially important to talk to your caregiver if a new problem develops soon after your last Pap test. In these cases, your caregiver may recommend more frequent screening and Pap tests.  The above recommendations are the same for women who have or have not gotten the vaccine for human papillomavirus (HPV).  If you had a hysterectomy for a problem that was not cancer or a condition that could lead to cancer, then you no longer need Pap tests. Even if you no longer need a Pap test, a regular exam is a good idea to make sure no other problems are  starting.  If you are between ages 65 and 70, and you have had normal Pap tests going back 10 years, you no longer need Pap tests. Even if you no longer need a Pap test, a regular exam is a good idea to make sure no other problems are starting.  If you have had past treatment for cervical cancer or a condition that could lead to cancer, you need Pap tests and screening for cancer for at least 20 years after your treatment.  If Pap tests have been discontinued, risk factors (such as a new sexual partner) need to be reassessed to determine if screening should be resumed.  The HPV test is an additional test that may be used for cervical cancer screening. The HPV test looks for the virus that can cause the cell changes on the cervix. The cells collected during the Pap test can be tested for HPV. The HPV test could be used to screen women aged 30 years and older, and should   be used in women of any age who have unclear Pap test results. After the age of 30, women should have HPV testing at the same frequency as a Pap test.  Colorectal cancer can be detected and often prevented. Most routine colorectal cancer screening begins at the age of 50 and continues through age 75. However, your caregiver may recommend screening at an earlier age if you have risk factors for colon cancer. On a yearly basis, your caregiver may provide home test kits to check for hidden blood in the stool. Use of a small camera at the end of a tube, to directly examine the colon (sigmoidoscopy or colonoscopy), can detect the earliest forms of colorectal cancer. Talk to your caregiver about this at age 50, when routine screening begins. Direct examination of the colon should be repeated every 5 to 10 years through age 75, unless early forms of pre-cancerous polyps or small growths are found.  Hepatitis C blood testing is recommended for all people born from 1945 through 1965 and any individual with known risks for hepatitis C.  Practice  safe sex. Use condoms and avoid high-risk sexual practices to reduce the spread of sexually transmitted infections (STIs). STIs include gonorrhea, chlamydia, syphilis, trichomonas, herpes, HPV, and human immunodeficiency virus (HIV). Herpes, HIV, and HPV are viral illnesses that have no cure. They can result in disability, cancer, and death. Sexually active women aged 25 and younger should be checked for chlamydia. Older women with new or multiple partners should also be tested for chlamydia. Testing for other STIs is recommended if you are sexually active and at increased risk.  Osteoporosis is a disease in which the bones lose minerals and strength with aging. This can result in serious bone fractures. The risk of osteoporosis can be identified using a bone density scan. Women ages 65 and over and women at risk for fractures or osteoporosis should discuss screening with their caregivers. Ask your caregiver whether you should take a calcium supplement or vitamin D to reduce the rate of osteoporosis.  Menopause can be associated with physical symptoms and risks. Hormone replacement therapy is available to decrease symptoms and risks. You should talk to your caregiver about whether hormone replacement therapy is right for you.  Use sunscreen with sun protection factor (SPF) of 30 or more. Apply sunscreen liberally and repeatedly throughout the day. You should seek shade when your shadow is shorter than you. Protect yourself by wearing long sleeves, pants, a wide-brimmed hat, and sunglasses year round, whenever you are outdoors.  Once a month, do a whole body skin exam, using a mirror to look at the skin on your back. Notify your caregiver of new moles, moles that have irregular borders, moles that are larger than a pencil eraser, or moles that have changed in shape or color.  Stay current with required immunizations.  Influenza. You need a dose every fall (or winter). The composition of the flu vaccine  changes each year, so being vaccinated once is not enough.  Pneumococcal polysaccharide. You need 1 to 2 doses if you smoke cigarettes or if you have certain chronic medical conditions. You need 1 dose at age 65 (or older) if you have never been vaccinated.  Tetanus, diphtheria, pertussis (Tdap, Td). Get 1 dose of Tdap vaccine if you are younger than age 65, are over 65 and have contact with an infant, are a healthcare worker, are pregnant, or simply want to be protected from whooping cough. After that, you need a Td   booster dose every 10 years. Consult your caregiver if you have not had at least 3 tetanus and diphtheria-containing shots sometime in your life or have a deep or dirty wound.  HPV. You need this vaccine if you are a woman age 26 or younger. The vaccine is given in 3 doses over 6 months.  Measles, mumps, rubella (MMR). You need at least 1 dose of MMR if you were born in 1957 or later. You may also need a second dose.  Meningococcal. If you are age 19 to 21 and a first-year college student living in a residence hall, or have one of several medical conditions, you need to get vaccinated against meningococcal disease. You may also need additional booster doses.  Zoster (shingles). If you are age 60 or older, you should get this vaccine.  Varicella (chickenpox). If you have never had chickenpox or you were vaccinated but received only 1 dose, talk to your caregiver to find out if you need this vaccine.  Hepatitis A. You need this vaccine if you have a specific risk factor for hepatitis A virus infection or you simply wish to be protected from this disease. The vaccine is usually given as 2 doses, 6 to 18 months apart.  Hepatitis B. You need this vaccine if you have a specific risk factor for hepatitis B virus infection or you simply wish to be protected from this disease. The vaccine is given in 3 doses, usually over 6 months. Preventive Services / Frequency Ages 19 to 39  Blood  pressure check.** / Every 1 to 2 years.  Lipid and cholesterol check.** / Every 5 years beginning at age 20.  Clinical breast exam.** / Every 3 years for women in their 20s and 30s.  Pap test.** / Every 2 years from ages 21 through 29. Every 3 years starting at age 30 through age 65 or 70 with a history of 3 consecutive normal Pap tests.  HPV screening.** / Every 3 years from ages 30 through ages 65 to 70 with a history of 3 consecutive normal Pap tests.  Hepatitis C blood test.** / For any individual with known risks for hepatitis C.  Skin self-exam. / Monthly.  Influenza immunization.** / Every year.  Pneumococcal polysaccharide immunization.** / 1 to 2 doses if you smoke cigarettes or if you have certain chronic medical conditions.  Tetanus, diphtheria, pertussis (Tdap, Td) immunization. / A one-time dose of Tdap vaccine. After that, you need a Td booster dose every 10 years.  HPV immunization. / 3 doses over 6 months, if you are 26 and younger.  Measles, mumps, rubella (MMR) immunization. / You need at least 1 dose of MMR if you were born in 1957 or later. You may also need a second dose.  Meningococcal immunization. / 1 dose if you are age 19 to 21 and a first-year college student living in a residence hall, or have one of several medical conditions, you need to get vaccinated against meningococcal disease. You may also need additional booster doses.  Varicella immunization.** / Consult your caregiver.  Hepatitis A immunization.** / Consult your caregiver. 2 doses, 6 to 18 months apart.  Hepatitis B immunization.** / Consult your caregiver. 3 doses usually over 6 months. Ages 40 to 64  Blood pressure check.** / Every 1 to 2 years.  Lipid and cholesterol check.** / Every 5 years beginning at age 20.  Clinical breast exam.** / Every year after age 40.  Mammogram.** / Every year beginning at age 40   and continuing for as long as you are in good health. Consult with your  caregiver.  Pap test.** / Every 3 years starting at age 30 through age 65 or 70 with a history of 3 consecutive normal Pap tests.  HPV screening.** / Every 3 years from ages 30 through ages 65 to 70 with a history of 3 consecutive normal Pap tests.  Fecal occult blood test (FOBT) of stool. / Every year beginning at age 50 and continuing until age 75. You may not need to do this test if you get a colonoscopy every 10 years.  Flexible sigmoidoscopy or colonoscopy.** / Every 5 years for a flexible sigmoidoscopy or every 10 years for a colonoscopy beginning at age 50 and continuing until age 75.  Hepatitis C blood test.** / For all people born from 1945 through 1965 and any individual with known risks for hepatitis C.  Skin self-exam. / Monthly.  Influenza immunization.** / Every year.  Pneumococcal polysaccharide immunization.** / 1 to 2 doses if you smoke cigarettes or if you have certain chronic medical conditions.  Tetanus, diphtheria, pertussis (Tdap, Td) immunization.** / A one-time dose of Tdap vaccine. After that, you need a Td booster dose every 10 years.  Measles, mumps, rubella (MMR) immunization. / You need at least 1 dose of MMR if you were born in 1957 or later. You may also need a second dose.  Varicella immunization.** / Consult your caregiver.  Meningococcal immunization.** / Consult your caregiver.  Hepatitis A immunization.** / Consult your caregiver. 2 doses, 6 to 18 months apart.  Hepatitis B immunization.** / Consult your caregiver. 3 doses, usually over 6 months. Ages 65 and over  Blood pressure check.** / Every 1 to 2 years.  Lipid and cholesterol check.** / Every 5 years beginning at age 20.  Clinical breast exam.** / Every year after age 40.  Mammogram.** / Every year beginning at age 40 and continuing for as long as you are in good health. Consult with your caregiver.  Pap test.** / Every 3 years starting at age 30 through age 65 or 70 with a 3  consecutive normal Pap tests. Testing can be stopped between 65 and 70 with 3 consecutive normal Pap tests and no abnormal Pap or HPV tests in the past 10 years.  HPV screening.** / Every 3 years from ages 30 through ages 65 or 70 with a history of 3 consecutive normal Pap tests. Testing can be stopped between 65 and 70 with 3 consecutive normal Pap tests and no abnormal Pap or HPV tests in the past 10 years.  Fecal occult blood test (FOBT) of stool. / Every year beginning at age 50 and continuing until age 75. You may not need to do this test if you get a colonoscopy every 10 years.  Flexible sigmoidoscopy or colonoscopy.** / Every 5 years for a flexible sigmoidoscopy or every 10 years for a colonoscopy beginning at age 50 and continuing until age 75.  Hepatitis C blood test.** / For all people born from 1945 through 1965 and any individual with known risks for hepatitis C.  Osteoporosis screening.** / A one-time screening for women ages 65 and over and women at risk for fractures or osteoporosis.  Skin self-exam. / Monthly.  Influenza immunization.** / Every year.  Pneumococcal polysaccharide immunization.** / 1 dose at age 65 (or older) if you have never been vaccinated.  Tetanus, diphtheria, pertussis (Tdap, Td) immunization. / A one-time dose of Tdap vaccine if you are over   65 and have contact with an infant, are a healthcare worker, or simply want to be protected from whooping cough. After that, you need a Td booster dose every 10 years.  Varicella immunization.** / Consult your caregiver.  Meningococcal immunization.** / Consult your caregiver.  Hepatitis A immunization.** / Consult your caregiver. 2 doses, 6 to 18 months apart.  Hepatitis B immunization.** / Check with your caregiver. 3 doses, usually over 6 months. ** Family history and personal history of risk and conditions may change your caregiver's recommendations. Document Released: 04/17/2001 Document Revised: 05/14/2011  Document Reviewed: 07/17/2010 [employer] Patient Information 2014 [employer]  

## 2012-10-31 ENCOUNTER — Other Ambulatory Visit: Payer: Self-pay

## 2012-10-31 LAB — BASIC METABOLIC PANEL
CO2: 26 mEq/L (ref 19–32)
Calcium: 9.8 mg/dL (ref 8.4–10.5)
Chloride: 108 mEq/L (ref 96–112)
Creatinine, Ser: 0.8 mg/dL (ref 0.4–1.2)
Glucose, Bld: 90 mg/dL (ref 70–99)

## 2012-10-31 LAB — LIPID PANEL
Cholesterol: 199 mg/dL (ref 0–200)
HDL: 68.1 mg/dL (ref >39.00)
Triglycerides: 108 mg/dL (ref 0.0–149.0)

## 2012-10-31 LAB — HEPATIC FUNCTION PANEL
ALT: 14 U/L (ref 0–35)
Bilirubin, Direct: 0.1 mg/dL (ref 0.0–0.3)
Total Bilirubin: 0.5 mg/dL (ref 0.3–1.2)
Total Protein: 7.4 g/dL (ref 6.0–8.3)

## 2012-10-31 MED ORDER — CIPROFLOXACIN HCL 500 MG PO TABS: 500.0000 mg | ORAL_TABLET | Freq: Two times a day (BID) | ORAL | Status: DC

## 2012-11-01 LAB — URINE CULTURE: Colony Count: >=100000

## 2012-11-06 ENCOUNTER — Other Ambulatory Visit: Payer: Self-pay

## 2012-11-06 MED ORDER — FLUCONAZOLE 150 MG PO TABS: 150.0000 mg | ORAL_TABLET | Freq: Once | ORAL | Status: DC

## 2012-12-22 LAB — HM MAMMOGRAPHY: HM Mammogram: NEGATIVE

## 2013-01-08 ENCOUNTER — Other Ambulatory Visit: Payer: Self-pay

## 2013-12-16 ENCOUNTER — Encounter: Payer: Medicare Other | Admitting: Family Medicine

## 2014-01-05 ENCOUNTER — Encounter: Payer: Self-pay | Admitting: Family Medicine

## 2014-02-11 ENCOUNTER — Ambulatory Visit (INDEPENDENT_AMBULATORY_CARE_PROVIDER_SITE_OTHER): Payer: No Typology Code available for payment source | Admitting: Family Medicine

## 2014-02-11 ENCOUNTER — Encounter: Payer: Self-pay | Admitting: Family Medicine

## 2014-02-11 ENCOUNTER — Other Ambulatory Visit (HOSPITAL_COMMUNITY)
Admission: RE | Admit: 2014-02-11 | Discharge: 2014-02-11 | Disposition: A | Discharge status: Home / Self Care | Payer: No Typology Code available for payment source | Source: Ambulatory Visit | Attending: Family Medicine | Admitting: Family Medicine

## 2014-02-11 VITALS — BP 110/76 | HR 72 | Temp 98.6°F | Ht 66.0 in | Wt 159.0 lb

## 2014-02-11 DIAGNOSIS — Z01419 Encounter for gynecological examination (general) (routine) without abnormal findings: Secondary | ICD-10-CM | POA: Insufficient documentation

## 2014-02-11 DIAGNOSIS — Z23 Encounter for immunization: Secondary | ICD-10-CM

## 2014-02-11 DIAGNOSIS — Z1151 Encounter for screening for human papillomavirus (HPV): Secondary | ICD-10-CM | POA: Diagnosis present

## 2014-02-11 DIAGNOSIS — R8290 Unspecified abnormal findings in urine: Secondary | ICD-10-CM

## 2014-02-11 DIAGNOSIS — Z0000 Encounter for general adult medical examination without abnormal findings: Secondary | ICD-10-CM

## 2014-02-11 DIAGNOSIS — Z124 Encounter for screening for malignant neoplasm of cervix: Secondary | ICD-10-CM

## 2014-02-11 LAB — CBC WITH DIFFERENTIAL/PLATELET
BASOS ABS: 0 10*3/uL (ref 0.0–0.1)
Basophils Relative: 0.5 % (ref 0.0–3.0)
EOS ABS: 0.1 10*3/uL (ref 0.0–0.7)
Eosinophils Relative: 0.8 % (ref 0.0–5.0)
HCT: 43.6 % (ref 36.0–46.0)
HEMOGLOBIN: 14.3 g/dL (ref 12.0–15.0)
Lymphocytes Relative: 21 % (ref 12.0–46.0)
Lymphs Abs: 1.7 10*3/uL (ref 0.7–4.0)
MCHC: 32.8 g/dL (ref 30.0–36.0)
MCV: 98.5 fl (ref 78.0–100.0)
MONOS PCT: 4.9 % (ref 3.0–12.0)
Monocytes Absolute: 0.4 10*3/uL (ref 0.1–1.0)
NEUTROS ABS: 5.8 10*3/uL (ref 1.4–7.7)
Neutrophils Relative %: 72.8 % (ref 43.0–77.0)
PLATELETS: 264 10*3/uL (ref 150.0–400.0)
RBC: 4.43 Mil/uL (ref 3.87–5.11)
RDW: 13.9 % (ref 11.5–15.5)
WBC: 8 10*3/uL (ref 4.0–10.5)

## 2014-02-11 LAB — BASIC METABOLIC PANEL
BUN: 16 mg/dL (ref 6–23)
CO2: 23 meq/L (ref 19–32)
Calcium: 9.5 mg/dL (ref 8.4–10.5)
Chloride: 106 mEq/L (ref 96–112)
Creatinine, Ser: 0.7 mg/dL (ref 0.4–1.2)
GFR: 87.28 mL/min (ref >60.00)
GLUCOSE: 86 mg/dL (ref 70–99)
Potassium: 4.7 mEq/L (ref 3.5–5.1)
SODIUM: 137 meq/L (ref 135–145)

## 2014-02-11 LAB — HEPATIC FUNCTION PANEL
ALT: 16 U/L (ref 0–35)
AST: 23 U/L (ref 0–37)
Albumin: 4.2 g/dL (ref 3.5–5.2)
Alkaline Phosphatase: 54 U/L (ref 39–117)
BILIRUBIN TOTAL: 0.5 mg/dL (ref 0.2–1.2)
Bilirubin, Direct: 0 mg/dL (ref 0.0–0.3)
Total Protein: 7.2 g/dL (ref 6.0–8.3)

## 2014-02-11 LAB — POCT URINALYSIS DIPSTICK
Bilirubin, UA: NEGATIVE
Glucose, UA: NEGATIVE
KETONES UA: NEGATIVE
Nitrite, UA: POSITIVE
PH UA: 6
PROTEIN UA: NEGATIVE
RBC UA: NEGATIVE
Spec Grav, UA: >=1.03
Urobilinogen, UA: NEGATIVE

## 2014-02-11 LAB — LIPID PANEL
Cholesterol: 203 mg/dL — ABNORMAL HIGH (ref 0–200)
HDL: 71.1 mg/dL (ref >39.00)
LDL CALC: 99 mg/dL (ref 0–99)
NonHDL: 131.9
TRIGLYCERIDES: 166 mg/dL — AB (ref 0.0–149.0)
Total CHOL/HDL Ratio: 3
VLDL: 33.2 mg/dL (ref 0.0–40.0)

## 2014-02-11 LAB — TSH: TSH: 1.49 u[IU]/mL (ref 0.35–4.50)

## 2014-02-11 NOTE — Progress Notes (Signed)
Pre visit review using our clinic review tool, if applicable. No additional management support is needed unless otherwise documented below in the visit note. 

## 2014-02-11 NOTE — Patient Instructions (Signed)
Preventive Care for Adults A healthy lifestyle and preventive care can promote health and wellness. Preventive health guidelines for women include the following key practices.  A routine yearly physical is a good way to check with your health care provider about your health and preventive screening. It is a chance to share any concerns and updates on your health and to receive a thorough exam.  Visit your dentist for a routine exam and preventive care every 6 months. Brush your teeth twice a day and floss once a day. Good oral hygiene prevents tooth decay and gum disease.  The frequency of eye exams is based on your age, health, family medical history, use of contact lenses, and other factors. Follow your health care provider's recommendations for frequency of eye exams.  Eat a healthy diet. Foods like vegetables, fruits, whole grains, low-fat dairy products, and lean protein foods contain the nutrients you need without too many calories. Decrease your intake of foods high in solid fats, added sugars, and salt. Eat the right amount of calories for you.Get information about a proper diet from your health care provider, if necessary.  Regular physical exercise is one of the most important things you can do for your health. Most adults should get at least 150 minutes of moderate-intensity exercise (any activity that increases your heart rate and causes you to sweat) each week. In addition, most adults need muscle-strengthening exercises on 2 or more days a week.  Maintain a healthy weight. The body mass index (BMI) is a screening tool to identify possible weight problems. It provides an estimate of body fat based on height and weight. Your health care provider can find your BMI and can help you achieve or maintain a healthy weight.For adults 20 years and older:  A BMI below 18.5 is considered underweight.  A BMI of 18.5 to 24.9 is normal.  A BMI of 25 to 29.9 is considered overweight.  A BMI of  30 and above is considered obese.  Maintain normal blood lipids and cholesterol levels by exercising and minimizing your intake of saturated fat. Eat a balanced diet with plenty of fruit and vegetables. Blood tests for lipids and cholesterol should begin at age 76 and be repeated every 5 years. If your lipid or cholesterol levels are high, you are over 50, or you are at high risk for heart disease, you may need your cholesterol levels checked more frequently.Ongoing high lipid and cholesterol levels should be treated with medicines if diet and exercise are not working.  If you smoke, find out from your health care provider how to quit. If you do not use tobacco, do not start.  Lung cancer screening is recommended for adults aged 22-80 years who are at high risk for developing lung cancer because of a history of smoking. A yearly low-dose CT scan of the lungs is recommended for people who have at least a 30-pack-year history of smoking and are a current smoker or have quit within the past 15 years. A pack year of smoking is smoking an average of 1 pack of cigarettes a day for 1 year (for example: 1 pack a day for 30 years or 2 packs a day for 15 years). Yearly screening should continue until the smoker has stopped smoking for at least 15 years. Yearly screening should be stopped for people who develop a health problem that would prevent them from having lung cancer treatment.  If you are pregnant, do not drink alcohol. If you are breastfeeding,  be very cautious about drinking alcohol. If you are not pregnant and choose to drink alcohol, do not have more than 1 drink per day. One drink is considered to be 12 ounces (355 mL) of beer, 5 ounces (148 mL) of wine, or 1.5 ounces (44 mL) of liquor.  Avoid use of street drugs. Do not share needles with anyone. Ask for help if you need support or instructions about stopping the use of drugs.  High blood pressure causes heart disease and increases the risk of  stroke. Your blood pressure should be checked at least every 1 to 2 years. Ongoing high blood pressure should be treated with medicines if weight loss and exercise do not work.  If you are 3-86 years old, ask your health care provider if you should take aspirin to prevent strokes.  Diabetes screening involves taking a blood sample to check your fasting blood sugar level. This should be done once every 3 years, after age 67, if you are within normal weight and without risk factors for diabetes. Testing should be considered at a younger age or be carried out more frequently if you are overweight and have at least 1 risk factor for diabetes.  Breast cancer screening is essential preventive care for women. You should practice "breast self-awareness." This means understanding the normal appearance and feel of your breasts and may include breast self-examination. Any changes detected, no matter how small, should be reported to a health care provider. Women in their 8s and 30s should have a clinical breast exam (CBE) by a health care provider as part of a regular health exam every 1 to 3 years. After age 70, women should have a CBE every year. Starting at age 25, women should consider having a mammogram (breast X-ray test) every year. Women who have a family history of breast cancer should talk to their health care provider about genetic screening. Women at a high risk of breast cancer should talk to their health care providers about having an MRI and a mammogram every year.  Breast cancer gene (BRCA)-related cancer risk assessment is recommended for women who have family members with BRCA-related cancers. BRCA-related cancers include breast, ovarian, tubal, and peritoneal cancers. Having family members with these cancers may be associated with an increased risk for harmful changes (mutations) in the breast cancer genes BRCA1 and BRCA2. Results of the assessment will determine the need for genetic counseling and  BRCA1 and BRCA2 testing.  Routine pelvic exams to screen for cancer are no longer recommended for nonpregnant women who are considered low risk for cancer of the pelvic organs (ovaries, uterus, and vagina) and who do not have symptoms. Ask your health care provider if a screening pelvic exam is right for you.  If you have had past treatment for cervical cancer or a condition that could lead to cancer, you need Pap tests and screening for cancer for at least 20 years after your treatment. If Pap tests have been discontinued, your risk factors (such as having a new sexual partner) need to be reassessed to determine if screening should be resumed. Some women have medical problems that increase the chance of getting cervical cancer. In these cases, your health care provider may recommend more frequent screening and Pap tests.  The HPV test is an additional test that may be used for cervical cancer screening. The HPV test looks for the virus that can cause the cell changes on the cervix. The cells collected during the Pap test can be  tested for HPV. The HPV test could be used to screen women aged 30 years and older, and should be used in women of any age who have unclear Pap test results. After the age of 30, women should have HPV testing at the same frequency as a Pap test.  Colorectal cancer can be detected and often prevented. Most routine colorectal cancer screening begins at the age of 50 years and continues through age 75 years. However, your health care provider may recommend screening at an earlier age if you have risk factors for colon cancer. On a yearly basis, your health care provider may provide home test kits to check for hidden blood in the stool. Use of a small camera at the end of a tube, to directly examine the colon (sigmoidoscopy or colonoscopy), can detect the earliest forms of colorectal cancer. Talk to your health care provider about this at age 50, when routine screening begins. Direct  exam of the colon should be repeated every 5-10 years through age 75 years, unless early forms of pre-cancerous polyps or small growths are found.  People who are at an increased risk for hepatitis B should be screened for this virus. You are considered at high risk for hepatitis B if:  You were born in a country where hepatitis B occurs often. Talk with your health care provider about which countries are considered high risk.  Your parents were born in a high-risk country and you have not received a shot to protect against hepatitis B (hepatitis B vaccine).  You have HIV or AIDS.  You use needles to inject street drugs.  You live with, or have sex with, someone who has hepatitis B.  You get hemodialysis treatment.  You take certain medicines for conditions like cancer, organ transplantation, and autoimmune conditions.  Hepatitis C blood testing is recommended for all people born from 1945 through 1965 and any individual with known risks for hepatitis C.  Practice safe sex. Use condoms and avoid high-risk sexual practices to reduce the spread of sexually transmitted infections (STIs). STIs include gonorrhea, chlamydia, syphilis, trichomonas, herpes, HPV, and human immunodeficiency virus (HIV). Herpes, HIV, and HPV are viral illnesses that have no cure. They can result in disability, cancer, and death.  You should be screened for sexually transmitted illnesses (STIs) including gonorrhea and chlamydia if:  You are sexually active and are younger than 24 years.  You are older than 24 years and your health care provider tells you that you are at risk for this type of infection.  Your sexual activity has changed since you were last screened and you are at an increased risk for chlamydia or gonorrhea. Ask your health care provider if you are at risk.  If you are at risk of being infected with HIV, it is recommended that you take a prescription medicine daily to prevent HIV infection. This is  called preexposure prophylaxis (PrEP). You are considered at risk if:  You are a heterosexual woman, are sexually active, and are at increased risk for HIV infection.  You take drugs by injection.  You are sexually active with a partner who has HIV.  Talk with your health care provider about whether you are at high risk of being infected with HIV. If you choose to begin PrEP, you should first be tested for HIV. You should then be tested every 3 months for as long as you are taking PrEP.  Osteoporosis is a disease in which the bones lose minerals and strength   with aging. This can result in serious bone fractures or breaks. The risk of osteoporosis can be identified using a bone density scan. Women ages 65 years and over and women at risk for fractures or osteoporosis should discuss screening with their health care providers. Ask your health care provider whether you should take a calcium supplement or vitamin D to reduce the rate of osteoporosis.  Menopause can be associated with physical symptoms and risks. Hormone replacement therapy is available to decrease symptoms and risks. You should talk to your health care provider about whether hormone replacement therapy is right for you.  Use sunscreen. Apply sunscreen liberally and repeatedly throughout the day. You should seek shade when your shadow is shorter than you. Protect yourself by wearing long sleeves, pants, a wide-brimmed hat, and sunglasses year round, whenever you are outdoors.  Once a month, do a whole body skin exam, using a mirror to look at the skin on your back. Tell your health care provider of new moles, moles that have irregular borders, moles that are larger than a pencil eraser, or moles that have changed in shape or color.  Stay current with required vaccines (immunizations).  Influenza vaccine. All adults should be immunized every year.  Tetanus, diphtheria, and acellular pertussis (Td, Tdap) vaccine. Pregnant women should  receive 1 dose of Tdap vaccine during each pregnancy. The dose should be obtained regardless of the length of time since the last dose. Immunization is preferred during the 27th-36th week of gestation. An adult who has not previously received Tdap or who does not know her vaccine status should receive 1 dose of Tdap. This initial dose should be followed by tetanus and diphtheria toxoids (Td) booster doses every 10 years. Adults with an unknown or incomplete history of completing a 3-dose immunization series with Td-containing vaccines should begin or complete a primary immunization series including a Tdap dose. Adults should receive a Td booster every 10 years.  Varicella vaccine. An adult without evidence of immunity to varicella should receive 2 doses or a second dose if she has previously received 1 dose. Pregnant females who do not have evidence of immunity should receive the first dose after pregnancy. This first dose should be obtained before leaving the health care facility. The second dose should be obtained 4-8 weeks after the first dose.  Human papillomavirus (HPV) vaccine. Females aged 13-26 years who have not received the vaccine previously should obtain the 3-dose series. The vaccine is not recommended for use in pregnant females. However, pregnancy testing is not needed before receiving a dose. If a female is found to be pregnant after receiving a dose, no treatment is needed. In that case, the remaining doses should be delayed until after the pregnancy. Immunization is recommended for any person with an immunocompromised condition through the age of 26 years if she did not get any or all doses earlier. During the 3-dose series, the second dose should be obtained 4-8 weeks after the first dose. The third dose should be obtained 24 weeks after the first dose and 16 weeks after the second dose.  Zoster vaccine. One dose is recommended for adults aged 60 years or older unless certain conditions are  present.  Measles, mumps, and rubella (MMR) vaccine. Adults born before 1957 generally are considered immune to measles and mumps. Adults born in 1957 or later should have 1 or more doses of MMR vaccine unless there is a contraindication to the vaccine or there is laboratory evidence of immunity to   each of the three diseases. A routine second dose of MMR vaccine should be obtained at least 28 days after the first dose for students attending postsecondary schools, health care workers, or international travelers. People who received inactivated measles vaccine or an unknown type of measles vaccine during 1963-1967 should receive 2 doses of MMR vaccine. People who received inactivated mumps vaccine or an unknown type of mumps vaccine before 1979 and are at high risk for mumps infection should consider immunization with 2 doses of MMR vaccine. For females of childbearing age, rubella immunity should be determined. If there is no evidence of immunity, females who are not pregnant should be vaccinated. If there is no evidence of immunity, females who are pregnant should delay immunization until after pregnancy. Unvaccinated health care workers born before 1957 who lack laboratory evidence of measles, mumps, or rubella immunity or laboratory confirmation of disease should consider measles and mumps immunization with 2 doses of MMR vaccine or rubella immunization with 1 dose of MMR vaccine.  Pneumococcal 13-valent conjugate (PCV13) vaccine. When indicated, a person who is uncertain of her immunization history and has no record of immunization should receive the PCV13 vaccine. An adult aged 19 years or older who has certain medical conditions and has not been previously immunized should receive 1 dose of PCV13 vaccine. This PCV13 should be followed with a dose of pneumococcal polysaccharide (PPSV23) vaccine. The PPSV23 vaccine dose should be obtained at least 8 weeks after the dose of PCV13 vaccine. An adult aged 19  years or older who has certain medical conditions and previously received 1 or more doses of PPSV23 vaccine should receive 1 dose of PCV13. The PCV13 vaccine dose should be obtained 1 or more years after the last PPSV23 vaccine dose.  Pneumococcal polysaccharide (PPSV23) vaccine. When PCV13 is also indicated, PCV13 should be obtained first. All adults aged 65 years and older should be immunized. An adult younger than age 65 years who has certain medical conditions should be immunized. Any person who resides in a nursing home or long-term care facility should be immunized. An adult smoker should be immunized. People with an immunocompromised condition and certain other conditions should receive both PCV13 and PPSV23 vaccines. People with human immunodeficiency virus (HIV) infection should be immunized as soon as possible after diagnosis. Immunization during chemotherapy or radiation therapy should be avoided. Routine use of PPSV23 vaccine is not recommended for American Indians, [place] Natives, or people younger than 65 years unless there are medical conditions that require PPSV23 vaccine. When indicated, people who have unknown immunization and have no record of immunization should receive PPSV23 vaccine. One-time revaccination 5 years after the first dose of PPSV23 is recommended for people aged 19-64 years who have chronic kidney failure, nephrotic syndrome, asplenia, or immunocompromised conditions. People who received 1-2 doses of PPSV23 before age 65 years should receive another dose of PPSV23 vaccine at age 65 years or later if at least 5 years have passed since the previous dose. Doses of PPSV23 are not needed for people immunized with PPSV23 at or after age 65 years.  Meningococcal vaccine. Adults with asplenia or persistent complement component deficiencies should receive 2 doses of quadrivalent meningococcal conjugate (MenACWY-D) vaccine. The doses should be obtained at least 2 months apart.  Microbiologists working with certain meningococcal bacteria, military recruits, people at risk during an outbreak, and people who travel to or live in countries with a high rate of meningitis should be immunized. A first-year college student up through age   21 years who is living in a residence hall should receive a dose if she did not receive a dose on or after her 16th birthday. Adults who have certain high-risk conditions should receive one or more doses of vaccine.  Hepatitis A vaccine. Adults who wish to be protected from this disease, have certain high-risk conditions, work with hepatitis A-infected animals, work in hepatitis A research labs, or travel to or work in countries with a high rate of hepatitis A should be immunized. Adults who were previously unvaccinated and who anticipate close contact with an international adoptee during the first 60 days after arrival in the [place] from a country with a high rate of hepatitis A should be immunized.  Hepatitis B vaccine. Adults who wish to be protected from this disease, have certain high-risk conditions, may be exposed to blood or other infectious body fluids, are household contacts or sex partners of hepatitis B positive people, are clients or workers in certain care facilities, or travel to or work in countries with a high rate of hepatitis B should be immunized.  Haemophilus influenzae type b (Hib) vaccine. A previously unvaccinated person with asplenia or sickle cell disease or having a scheduled splenectomy should receive 1 dose of Hib vaccine. Regardless of previous immunization, a recipient of a hematopoietic stem cell transplant should receive a 3-dose series 6-12 months after her successful transplant. Hib vaccine is not recommended for adults with HIV infection. Preventive Services / Frequency Ages 64 to 68 years  Blood pressure check.** / Every 1 to 2 years.  Lipid and cholesterol check.** / Every 5 years beginning at age  22.  Clinical breast exam.** / Every 3 years for women in their 88s and 53s.  BRCA-related cancer risk assessment.** / For women who have family members with a BRCA-related cancer (breast, ovarian, tubal, or peritoneal cancers).  Pap test.** / Every 2 years from ages 90 through 51. Every 3 years starting at age 21 through age 56 or 3 with a history of 3 consecutive normal Pap tests.  HPV screening.** / Every 3 years from ages 24 through ages 1 to 46 with a history of 3 consecutive normal Pap tests.  Hepatitis C blood test.** / For any individual with known risks for hepatitis C.  Skin self-exam. / Monthly.  Influenza vaccine. / Every year.  Tetanus, diphtheria, and acellular pertussis (Tdap, Td) vaccine.** / Consult your health care provider. Pregnant women should receive 1 dose of Tdap vaccine during each pregnancy. 1 dose of Td every 10 years.  Varicella vaccine.** / Consult your health care provider. Pregnant females who do not have evidence of immunity should receive the first dose after pregnancy.  HPV vaccine. / 3 doses over 6 months, if 72 and younger. The vaccine is not recommended for use in pregnant females. However, pregnancy testing is not needed before receiving a dose.  Measles, mumps, rubella (MMR) vaccine.** / You need at least 1 dose of MMR if you were born in 1957 or later. You may also need a 2nd dose. For females of childbearing age, rubella immunity should be determined. If there is no evidence of immunity, females who are not pregnant should be vaccinated. If there is no evidence of immunity, females who are pregnant should delay immunization until after pregnancy.  Pneumococcal 13-valent conjugate (PCV13) vaccine.** / Consult your health care provider.  Pneumococcal polysaccharide (PPSV23) vaccine.** / 1 to 2 doses if you smoke cigarettes or if you have certain conditions.  Meningococcal vaccine.** /  1 dose if you are age 19 to 21 years and a first-year college  student living in a residence hall, or have one of several medical conditions, you need to get vaccinated against meningococcal disease. You may also need additional booster doses.  Hepatitis A vaccine.** / Consult your health care provider.  Hepatitis B vaccine.** / Consult your health care provider.  Haemophilus influenzae type b (Hib) vaccine.** / Consult your health care provider. Ages 40 to 64 years  Blood pressure check.** / Every 1 to 2 years.  Lipid and cholesterol check.** / Every 5 years beginning at age 20 years.  Lung cancer screening. / Every year if you are aged 55-80 years and have a 30-pack-year history of smoking and currently smoke or have quit within the past 15 years. Yearly screening is stopped once you have quit smoking for at least 15 years or develop a health problem that would prevent you from having lung cancer treatment.  Clinical breast exam.** / Every year after age 40 years.  BRCA-related cancer risk assessment.** / For women who have family members with a BRCA-related cancer (breast, ovarian, tubal, or peritoneal cancers).  Mammogram.** / Every year beginning at age 40 years and continuing for as long as you are in good health. Consult with your health care provider.  Pap test.** / Every 3 years starting at age 30 years through age 65 or 70 years with a history of 3 consecutive normal Pap tests.  HPV screening.** / Every 3 years from ages 30 years through ages 65 to 70 years with a history of 3 consecutive normal Pap tests.  Fecal occult blood test (FOBT) of stool. / Every year beginning at age 50 years and continuing until age 75 years. You may not need to do this test if you get a colonoscopy every 10 years.  Flexible sigmoidoscopy or colonoscopy.** / Every 5 years for a flexible sigmoidoscopy or every 10 years for a colonoscopy beginning at age 50 years and continuing until age 75 years.  Hepatitis C blood test.** / For all people born from 1945 through  1965 and any individual with known risks for hepatitis C.  Skin self-exam. / Monthly.  Influenza vaccine. / Every year.  Tetanus, diphtheria, and acellular pertussis (Tdap/Td) vaccine.** / Consult your health care provider. Pregnant women should receive 1 dose of Tdap vaccine during each pregnancy. 1 dose of Td every 10 years.  Varicella vaccine.** / Consult your health care provider. Pregnant females who do not have evidence of immunity should receive the first dose after pregnancy.  Zoster vaccine.** / 1 dose for adults aged 60 years or older.  Measles, mumps, rubella (MMR) vaccine.** / You need at least 1 dose of MMR if you were born in 1957 or later. You may also need a 2nd dose. For females of childbearing age, rubella immunity should be determined. If there is no evidence of immunity, females who are not pregnant should be vaccinated. If there is no evidence of immunity, females who are pregnant should delay immunization until after pregnancy.  Pneumococcal 13-valent conjugate (PCV13) vaccine.** / Consult your health care provider.  Pneumococcal polysaccharide (PPSV23) vaccine.** / 1 to 2 doses if you smoke cigarettes or if you have certain conditions.  Meningococcal vaccine.** / Consult your health care provider.  Hepatitis A vaccine.** / Consult your health care provider.  Hepatitis B vaccine.** / Consult your health care provider.  Haemophilus influenzae type b (Hib) vaccine.** / Consult your health care provider. Ages 65   years and over  Blood pressure check.** / Every 1 to 2 years.  Lipid and cholesterol check.** / Every 5 years beginning at age 22 years.  Lung cancer screening. / Every year if you are aged 73-80 years and have a 30-pack-year history of smoking and currently smoke or have quit within the past 15 years. Yearly screening is stopped once you have quit smoking for at least 15 years or develop a health problem that would prevent you from having lung cancer  treatment.  Clinical breast exam.** / Every year after age 4 years.  BRCA-related cancer risk assessment.** / For women who have family members with a BRCA-related cancer (breast, ovarian, tubal, or peritoneal cancers).  Mammogram.** / Every year beginning at age 40 years and continuing for as long as you are in good health. Consult with your health care provider.  Pap test.** / Every 3 years starting at age 9 years through age 34 or 91 years with 3 consecutive normal Pap tests. Testing can be stopped between 65 and 70 years with 3 consecutive normal Pap tests and no abnormal Pap or HPV tests in the past 10 years.  HPV screening.** / Every 3 years from ages 57 years through ages 64 or 45 years with a history of 3 consecutive normal Pap tests. Testing can be stopped between 65 and 70 years with 3 consecutive normal Pap tests and no abnormal Pap or HPV tests in the past 10 years.  Fecal occult blood test (FOBT) of stool. / Every year beginning at age 15 years and continuing until age 17 years. You may not need to do this test if you get a colonoscopy every 10 years.  Flexible sigmoidoscopy or colonoscopy.** / Every 5 years for a flexible sigmoidoscopy or every 10 years for a colonoscopy beginning at age 86 years and continuing until age 71 years.  Hepatitis C blood test.** / For all people born from [birth year] through 1964 and any individual with known risks for hepatitis C.  Osteoporosis screening.** / A one-time screening for women ages 83 years and over and women at risk for fractures or osteoporosis.  Skin self-exam. / Monthly.  Influenza vaccine. / Every year.  Tetanus, diphtheria, and acellular pertussis (Tdap/Td) vaccine.** / 1 dose of Td every 10 years.  Varicella vaccine.** / Consult your health care provider.  Zoster vaccine.** / 1 dose for adults aged 61 years or older.  Pneumococcal 13-valent conjugate (PCV13) vaccine.** / Consult your health care provider.  Pneumococcal  polysaccharide (PPSV23) vaccine.** / 1 dose for all adults aged 28 years and older.  Meningococcal vaccine.** / Consult your health care provider.  Hepatitis A vaccine.** / Consult your health care provider.  Hepatitis B vaccine.** / Consult your health care provider.  Haemophilus influenzae type b (Hib) vaccine.** / Consult your health care provider. ** Family history and personal history of risk and conditions may change your health care provider's recommendations. Document Released: 04/17/2001 Document Revised: 07/06/2013 Document Reviewed: 07/17/2010 [facility] Patient Information 2015 [place], [place]. This information is not intended to replace advice given to you by your health care provider. Make sure you discuss any questions you have with your health care provider.

## 2014-02-11 NOTE — Addendum Note (Signed)
Addended by: Modena Morrow D on: 02/11/2014 05:09 PM   Modules accepted: Orders

## 2014-02-11 NOTE — Progress Notes (Signed)
Subjective:     Ashley Salinas is a 58 y.o. female and is here for a comprehensive physical exam. The patient reports no problems.  History   Social History  . Marital Status: Widowed    Spouse Name: N/A    Number of Children: N/A  . Years of Education: N/A   Occupational History  .      unemployed-- looking for another job   Social History Main Topics  . Smoking status: Former Smoker -- 0.50 packs/day for 35 years    Types: Cigarettes    Quit date: 10/04/2010  . Smokeless tobacco: Never Used  . Alcohol Use: 0.6 oz/week    1 Glasses of wine per week     Comment: rare-- actually < 1x   . Drug Use: No  . Sexual Activity:    Partners: Male   Other Topics Concern  . Not on file   Social History Narrative   Exercise-  no   Health Maintenance  Topic Date Due  . INFLUENZA VACCINE  10/04/2014  . COLONOSCOPY  08/05/2015  . PAP SMEAR  10/31/2015  . MAMMOGRAM  12/31/2015  . TETANUS/TDAP  12/24/2016    The following portions of the patient's history were reviewed and updated as appropriate:  She  has a past medical history of Depression. She  does not have any pertinent problems on file. 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 Her family history includes Arthritis in her father; Breast cancer in her mother; Cancer in her father; Cancer (age of onset: 15) in her mother. She  reports that she quit smoking about 3 years ago. Her smoking use included Cigarettes. She has a 17.5 pack-year smoking history. She has never used smokeless tobacco. She reports that she drinks about 0.6 oz of alcohol per week. She reports that she does not use illicit drugs. She currently has no medications in their medication list. No current outpatient prescriptions on file prior to visit.   No  current facility-administered medications on file prior to visit.   She is allergic to azithromycin..  Review of Systems Review of Systems  Constitutional: Negative for activity change, appetite change and fatigue.  HENT: Negative for hearing loss, congestion, tinnitus and ear discharge.  dentist q36m Eyes: Negative for visual disturbance (see optho q1y -- vision corrected to 20/20 with glasses).  Respiratory: Negative for cough, chest tightness and shortness of breath.   Cardiovascular: Negative for chest pain, palpitations and leg swelling.  Gastrointestinal: Negative for abdominal pain, diarrhea, constipation and abdominal distention.  Genitourinary: Negative for urgency, frequency, decreased urine volume and difficulty urinating.  Musculoskeletal: Negative for back pain, arthralgias and gait problem.  Skin: Negative for color change, pallor and rash.  Neurological: Negative for dizziness, light-headedness, numbness and headaches.  Hematological: Negative for adenopathy. Does not bruise/bleed easily.  Psychiatric/Behavioral: Negative for suicidal ideas, confusion, sleep disturbance, self-injury, dysphoric mood, decreased concentration and agitation.       Objective:    BP 110/76 mmHg  Pulse 72  Temp(Src) 98.6 F (37 C) (Oral)  Ht 5\' 6"  (1.676 m)  Wt 159 lb (72.122 kg)  BMI 25.68 kg/m2  SpO2 99% General appearance: alert, cooperative, appears stated age and no distress Head: Normocephalic, without obvious abnormality, atraumatic Eyes: conjunctivae/corneas clear. PERRL, EOM's intact. Fundi benign. Ears: normal TM's and external ear canals both  ears Nose: Nares normal. Septum midline. Mucosa normal. No drainage or sinus tenderness. Throat: lips, mucosa, and tongue normal; teeth and gums normal Neck: no adenopathy, no carotid bruit, no JVD, supple, symmetrical, trachea midline and thyroid not enlarged, symmetric, no tenderness/mass/nodules Back: symmetric, no curvature. ROM  normal. No CVA tenderness. Lungs: clear to auscultation bilaterally Breasts: normal appearance, no masses or tenderness Heart: regular rate and rhythm, S1, S2 normal, no murmur, click, rub or gallop Abdomen: soft, non-tender; bowel sounds normal; no masses,  no organomegaly Pelvic: cervix normal in appearance, external genitalia normal, no adnexal masses or tenderness, no cervical motion tenderness, rectovaginal septum normal, uterus normal size, shape, and consistency, vagina normal without discharge and pap done Extremities: extremities normal, atraumatic, no cyanosis or edema Pulses: 2+ and symmetric Skin: Skin color, texture, turgor normal. No rashes or lesions Lymph nodes: Cervical, supraclavicular, and axillary nodes normal. Neurologic: Alert and oriented X 3, normal strength and tone. Normal symmetric reflexes. Normal coordination and gait Psych=-- no depression, no anxiety      Assessment:    Healthy female exam.      Plan:    ghm utd Check labs See After Visit Summary for Counseling Recommendations    1. Need for prophylactic vaccination and inoculation against influenza  - Flu Vaccine QUAD 36+ mos PF IM (Fluarix Quad PF)  2. Preventative health care   - Basic metabolic panel - CBC with Differential - Hepatic function panel - Lipid panel - POCT urinalysis dipstick - TSH  3. Screening for malignant neoplasm of cervix  - Cytology - PAP

## 2014-02-14 LAB — URINE CULTURE

## 2014-02-15 ENCOUNTER — Other Ambulatory Visit: Payer: Self-pay

## 2014-02-15 MED ORDER — CIPROFLOXACIN HCL 250 MG PO TABS: 250.0000 mg | ORAL_TABLET | Freq: Two times a day (BID) | ORAL | Status: DC

## 2014-02-16 LAB — CYTOLOGY - PAP

## 2014-05-18 ENCOUNTER — Telehealth: Payer: Self-pay | Admitting: *Deleted

## 2014-05-18 NOTE — Telephone Encounter (Signed)
Pt dropped off wellness screening form. Form completed, signed by Dr. Etter Sjogren and mailed to:    [employer]   Attn: Data Dept .  [address]   [place] [address]  Copy sent for scanning. JG//CMA

## 2014-05-19 ENCOUNTER — Telehealth: Payer: Self-pay | Admitting: Family Medicine

## 2014-05-19 NOTE — Telephone Encounter (Signed)
The new guidelines do not recommend antibiotics prior to routine dental procedures after hip replacement.

## 2014-05-19 NOTE — Telephone Encounter (Signed)
Caller name: Koa Relation to pt: self Call back number: [phone] Pharmacy:[facility] [address]  Reason for call:   Has appointment tomorrow morning with the dentist and is requesting an antiobiotic.

## 2014-05-19 NOTE — Telephone Encounter (Signed)
Spoke with patient and she said she has taken Amox 500 mg 4 tabs prior to dental work due to Hip replacement. She is requesting [facility] on [address]   Please advise      KP

## 2014-05-19 NOTE — Telephone Encounter (Signed)
Dr. Virgil Benedict note discussed with patient.  She asked that I forward note to Dr. Etter Sjogren.  Note forwarded.

## 2014-05-19 NOTE — Telephone Encounter (Signed)
MSG left to call the office      KP 

## 2014-05-21 NOTE — Telephone Encounter (Signed)
MSG left to call the office      KP 

## 2014-05-21 NOTE — Telephone Encounter (Signed)
agree

## 2014-11-11 DIAGNOSIS — S0502XD Injury of conjunctiva and corneal abrasion without foreign body, left eye, subsequent encounter: Secondary | ICD-10-CM | POA: Diagnosis not present

## 2014-11-11 DIAGNOSIS — H17822 Peripheral opacity of cornea, left eye: Secondary | ICD-10-CM | POA: Diagnosis not present

## 2014-11-15 DIAGNOSIS — S0502XD Injury of conjunctiva and corneal abrasion without foreign body, left eye, subsequent encounter: Secondary | ICD-10-CM | POA: Diagnosis not present

## 2014-11-15 DIAGNOSIS — H17822 Peripheral opacity of cornea, left eye: Secondary | ICD-10-CM | POA: Diagnosis not present

## 2014-11-22 DIAGNOSIS — S0502XD Injury of conjunctiva and corneal abrasion without foreign body, left eye, subsequent encounter: Secondary | ICD-10-CM | POA: Diagnosis not present

## 2014-11-22 DIAGNOSIS — H17822 Peripheral opacity of cornea, left eye: Secondary | ICD-10-CM | POA: Diagnosis not present

## 2015-01-06 DIAGNOSIS — Z1231 Encounter for screening mammogram for malignant neoplasm of breast: Secondary | ICD-10-CM | POA: Diagnosis not present

## 2015-01-06 DIAGNOSIS — Z9882 Breast implant status: Secondary | ICD-10-CM | POA: Diagnosis not present

## 2015-01-06 LAB — HM MAMMOGRAPHY

## 2015-02-14 ENCOUNTER — Telehealth: Payer: Self-pay

## 2015-02-14 NOTE — Telephone Encounter (Signed)
Called patient for Pre-VIsit  information

## 2015-02-15 ENCOUNTER — Ambulatory Visit (INDEPENDENT_AMBULATORY_CARE_PROVIDER_SITE_OTHER): Payer: Medicare Other | Admitting: Family Medicine

## 2015-02-15 ENCOUNTER — Other Ambulatory Visit (HOSPITAL_COMMUNITY)
Admission: RE | Admit: 2015-02-15 | Discharge: 2015-02-15 | Disposition: A | Discharge status: Home / Self Care | Payer: Medicare Other | Source: Ambulatory Visit | Attending: Family Medicine | Admitting: Family Medicine

## 2015-02-15 ENCOUNTER — Encounter: Payer: Self-pay | Admitting: Family Medicine

## 2015-02-15 VITALS — BP 110/75 | HR 76 | Temp 98.5°F | Ht 66.5 in | Wt 164.8 lb

## 2015-02-15 DIAGNOSIS — Z1159 Encounter for screening for other viral diseases: Secondary | ICD-10-CM

## 2015-02-15 DIAGNOSIS — F17200 Nicotine dependence, unspecified, uncomplicated: Secondary | ICD-10-CM | POA: Diagnosis not present

## 2015-02-15 DIAGNOSIS — Z136 Encounter for screening for cardiovascular disorders: Secondary | ICD-10-CM | POA: Diagnosis not present

## 2015-02-15 DIAGNOSIS — Z0000 Encounter for general adult medical examination without abnormal findings: Secondary | ICD-10-CM | POA: Diagnosis not present

## 2015-02-15 DIAGNOSIS — R8290 Unspecified abnormal findings in urine: Secondary | ICD-10-CM | POA: Diagnosis not present

## 2015-02-15 DIAGNOSIS — Z124 Encounter for screening for malignant neoplasm of cervix: Secondary | ICD-10-CM

## 2015-02-15 DIAGNOSIS — Z1151 Encounter for screening for human papillomavirus (HPV): Secondary | ICD-10-CM | POA: Diagnosis not present

## 2015-02-15 DIAGNOSIS — Z01419 Encounter for gynecological examination (general) (routine) without abnormal findings: Secondary | ICD-10-CM | POA: Insufficient documentation

## 2015-02-15 DIAGNOSIS — Z114 Encounter for screening for human immunodeficiency virus [HIV]: Secondary | ICD-10-CM | POA: Diagnosis not present

## 2015-02-15 DIAGNOSIS — R8299 Other abnormal findings in urine: Secondary | ICD-10-CM | POA: Diagnosis not present

## 2015-02-15 LAB — LIPID PANEL
CHOL/HDL RATIO: 3
Cholesterol: 205 mg/dL — ABNORMAL HIGH (ref 0–200)
HDL: 73.9 mg/dL (ref >39.00)
LDL CALC: 98 mg/dL (ref 0–99)
NonHDL: 131.54
TRIGLYCERIDES: 168 mg/dL — AB (ref 0.0–149.0)
VLDL: 33.6 mg/dL (ref 0.0–40.0)

## 2015-02-15 LAB — COMPREHENSIVE METABOLIC PANEL
ALT: 14 U/L (ref 0–35)
AST: 21 U/L (ref 0–37)
Albumin: 4.4 g/dL (ref 3.5–5.2)
Alkaline Phosphatase: 49 U/L (ref 39–117)
BUN: 18 mg/dL (ref 6–23)
CALCIUM: 9.6 mg/dL (ref 8.4–10.5)
CHLORIDE: 107 meq/L (ref 96–112)
CO2: 26 meq/L (ref 19–32)
Creatinine, Ser: 0.87 mg/dL (ref 0.40–1.20)
GFR: 71.03 mL/min (ref >60.00)
Glucose, Bld: 91 mg/dL (ref 70–99)
Potassium: 4.1 mEq/L (ref 3.5–5.1)
Sodium: 141 mEq/L (ref 135–145)
Total Bilirubin: 0.5 mg/dL (ref 0.2–1.2)
Total Protein: 7.1 g/dL (ref 6.0–8.3)

## 2015-02-15 LAB — POCT URINALYSIS DIPSTICK
BILIRUBIN UA: NEGATIVE
GLUCOSE UA: NEGATIVE
Ketones, UA: NEGATIVE
Leukocytes, UA: NEGATIVE
Nitrite, UA: POSITIVE
Protein, UA: NEGATIVE
RBC UA: NEGATIVE
Urobilinogen, UA: 0.2
pH, UA: 6

## 2015-02-15 MED ORDER — VARENICLINE TARTRATE 0.5 MG X 11 & 1 MG X 42 PO MISC: ORAL | Status: DC

## 2015-02-15 NOTE — Progress Notes (Signed)
Subjective:   Ashley Salinas is a 59 y.o. female who presents for Medicare Annual (Subsequent) preventive examination.  Review of Systems:   Review of Systems  Constitutional: Negative for activity change, appetite change and fatigue.  HENT: Negative for hearing loss, congestion, tinnitus and ear discharge.   Eyes: Negative for visual disturbance (see optho q1y -- vision corrected to 20/20 with glasses).  Respiratory: Negative for cough, chest tightness and shortness of breath.   Cardiovascular: Negative for chest pain, palpitations and leg swelling.  Gastrointestinal: Negative for abdominal pain, diarrhea, constipation and abdominal distention.  Genitourinary: Negative for urgency, frequency, decreased urine volume and difficulty urinating.  Musculoskeletal: Negative for back pain, arthralgias and gait problem.  Skin: Negative for color change, pallor and rash.  Neurological: Negative for dizziness, light-headedness, numbness and headaches.  Hematological: Negative for adenopathy. Does not bruise/bleed easily.  Psychiatric/Behavioral: Negative for suicidal ideas, confusion, sleep disturbance, self-injury, dysphoric mood, decreased concentration and agitation.  Pt is able to read and write and can do all ADLs No risk for falling No abuse/ violence in home           Objective:     Vitals: BP 110/75 mmHg  Pulse 76  Temp(Src) 98.5 F (36.9 C) (Oral)  Ht 5' 6.5" (1.689 m)  Wt 164 lb 12.8 oz (74.753 kg)  BMI 26.20 kg/m2  SpO2 98% BP 110/75 mmHg  Pulse 76  Temp(Src) 98.5 F (36.9 C) (Oral)  Ht 5' 6.5" (1.689 m)  Wt 164 lb 12.8 oz (74.753 kg)  BMI 26.20 kg/m2  SpO2 98% General appearance: alert, cooperative, appears stated age and no distress Head: Normocephalic, without obvious abnormality, atraumatic Eyes: conjunctivae/corneas clear. PERRL, EOM's intact. Fundi benign. Ears: normal TM's and external ear canals both ears Nose: Nares normal. Septum midline. Mucosa normal.  No drainage or sinus tenderness. Throat: lips, mucosa, and tongue normal; teeth and gums normal Neck: no adenopathy, no carotid bruit, no JVD, supple, symmetrical, trachea midline and thyroid not enlarged, symmetric, no tenderness/mass/nodules Back: symmetric, no curvature. ROM normal. No CVA tenderness. Lungs: clear to auscultation bilaterally Breasts: normal appearance, no masses or tenderness Heart: regular rate and rhythm, S1, S2 normal, no murmur, click, rub or gallop Abdomen: soft, non-tender; bowel sounds normal; no masses,  no organomegaly Pelvic: cervix normal in appearance, external genitalia normal, no adnexal masses or tenderness, no cervical motion tenderness, rectovaginal septum normal, uterus normal size, shape, and consistency, vagina normal without discharge and pap done,  rectal heme neg brown stool Extremities: extremities normal, atraumatic, no cyanosis or edema Pulses: 2+ and symmetric Skin: Skin color, texture, turgor normal. No rashes or lesions Lymph nodes: Cervical, supraclavicular, and axillary nodes normal. Neurologic: Alert and oriented X 3, normal strength and tone. Normal symmetric reflexes. Normal coordination and gait Psych- no depression, no anxiety Tobacco History  Smoking status  . Current Some Day Smoker -- 0.50 packs/day for 35 years  . Types: Cigarettes  . Last Attempt to Quit: 10/04/2010  Smokeless tobacco  . Never Used    Comment: smoking on and off-- few cig here and there     Ready to quit: No Counseling given: Yes   Past Medical History  Diagnosis Date  . Depression    Past Surgical History  Procedure Laterality Date  . Lumbar fusion  03/21/2007  . Breast enhancement surgery    . Total hip arthroplasty  10/11/2010,  01/2011    Left- Dr.Moore--- revision  . Appendectomy  1990  . Small  intestine surgery  1990  . Tubal ligation  2004  . Total hip arthroplasty      Left- Dr.Moore   Family History  Problem Relation Age of Onset  .  Breast cancer Mother   . Cancer Mother 61    breast  . Arthritis Father   . Cancer Father     multiple myeloma  stage 3B   History  Sexual Activity  . Sexual Activity:  . Partners: Male    Outpatient Encounter Prescriptions as of 02/15/2015  Medication Sig  . Multiple Vitamin (MULTIVITAMIN) capsule Take 1 capsule by mouth daily.  . varenicline (CHANTIX STARTING MONTH PAK) 0.5 MG X 11 & 1 MG X 42 tablet Take one 0.5 mg tablet by mouth once daily for 3 days, then increase to one 0.5 mg tablet twice daily for 4 days, then increase to one 1 mg tablet twice daily.   No facility-administered encounter medications on file as of 02/15/2015.    Activities of Daily Living In your present state of health, do you have any difficulty performing the following activities: 02/15/2015 02/15/2015  Hearing? N N  Vision? N N  Difficulty concentrating or making decisions? N N  Walking or climbing stairs? N N  Dressing or bathing? N N  Doing errands, shopping? N N    Patient Care Team: Rosalita Chessman, DO as PCP - General Jalene Mullet, MD as Consulting Physician (Ophthalmology) Remer Macho, MD as Attending Physician (Ophthalmology) Tamela Oddi, DDS as Consulting Physician (Oral Surgery)    Assessment:    cpe: Exercise Activities and Dietary recommendations-- pt getting minimal exercise due to hip surgerys and arthritis pain    Goals    None     Fall Risk Fall Risk  02/15/2015  Falls in the past year? No   Depression Screen PHQ 2/9 Scores 02/15/2015 10/30/2012  PHQ - 2 Score 0 0     Cognitive Testing mmse 30/30  Immunization History  Administered Date(s) Administered  . H1N1 01/20/2008  . Influenza Whole 12/25/2006, 12/17/2007, 01/20/2009  . Influenza,inj,Quad PF,36+ Mos 02/11/2014  . Influenza-Unspecified 01/04/2015  . Td 12/25/2006   Screening Tests Health Maintenance  Topic Date Due  . Hepatitis C Screening  11-05-1955  . HIV Screening  12/09/1971  .  COLONOSCOPY  08/05/2015  . INFLUENZA VACCINE  10/04/2015  . MAMMOGRAM  01/06/2016  . TETANUS/TDAP  12/24/2016  . PAP SMEAR  02/11/2017      Plan:    see avs During the course of the visit the patient was educated and counseled about the following appropriate screening and preventive services:   Vaccines to include Pneumoccal, Influenza, Hepatitis B, Td, Zostavax, HCV  Electrocardiogram  Cardiovascular Disease  Colorectal cancer screening  Bone density screening  Diabetes screening  Glaucoma screening  Mammography/PAP  Nutrition counseling   Patient Instructions (the written plan) was given to the patient.  1. Tobacco use disorder  - varenicline (CHANTIX STARTING MONTH PAK) 0.5 MG X 11 & 1 MG X 42 tablet; Take one 0.5 mg tablet by mouth once daily for 3 days, then increase to one 0.5 mg tablet twice daily for 4 days, then increase to one 1 mg tablet twice daily.  Dispense: 53 tablet; Refill: 0  2. Medicare annual wellness visit, subsequent  - Fecal occult blood, imunochemical; Future  3. Need for hepatitis C screening test  - Hepatitis C antibody  4. Encounter for screening for HIV Medicare will not pay for screen  5. Ischemic  heart disease screen  - Comp Met (CMET) - Lipid panel - POCT urinalysis dipstick  6. Routine history and physical examination of adult    Garnet Koyanagi, DO  02/15/2015

## 2015-02-15 NOTE — Progress Notes (Signed)
Pre visit review using our clinic review tool, if applicable. No additional management support is needed unless otherwise documented below in the visit note. 

## 2015-02-15 NOTE — Patient Instructions (Addendum)
Preventive Care for Adults, Female A healthy lifestyle and preventive care can promote health and wellness. Preventive health guidelines for women include the following key practices.  A routine yearly physical is a good way to check with your health care provider about your health and preventive screening. It is a chance to share any concerns and updates on your health and to receive a thorough exam.  Visit your dentist for a routine exam and preventive care every 6 months. Brush your teeth twice a day and floss once a day. Good oral hygiene prevents tooth decay and gum disease.  The frequency of eye exams is based on your age, health, family medical history, use of contact lenses, and other factors. Follow your health care provider's recommendations for frequency of eye exams.  Eat a healthy diet. Foods like vegetables, fruits, whole grains, low-fat dairy products, and lean protein foods contain the nutrients you need without too many calories. Decrease your intake of foods high in solid fats, added sugars, and salt. Eat the right amount of calories for you.Get information about a proper diet from your health care provider, if necessary.  Regular physical exercise is one of the most important things you can do for your health. Most adults should get at least 150 minutes of moderate-intensity exercise (any activity that increases your heart rate and causes you to sweat) each week. In addition, most adults need muscle-strengthening exercises on 2 or more days a week.  Maintain a healthy weight. The body mass index (BMI) is a screening tool to identify possible weight problems. It provides an estimate of body fat based on height and weight. Your health care provider can find your BMI and can help you achieve or maintain a healthy weight.For adults 20 years and older:  A BMI below 18.5 is considered underweight.  A BMI of 18.5 to 24.9 is normal.  A BMI of 25 to 29.9 is considered overweight.  A  BMI of 30 and above is considered obese.  Maintain normal blood lipids and cholesterol levels by exercising and minimizing your intake of saturated fat. Eat a balanced diet with plenty of fruit and vegetables. Blood tests for lipids and cholesterol should begin at age 45 and be repeated every 5 years. If your lipid or cholesterol levels are high, you are over 50, or you are at high risk for heart disease, you may need your cholesterol levels checked more frequently.Ongoing high lipid and cholesterol levels should be treated with medicines if diet and exercise are not working.  If you smoke, find out from your health care provider how to quit. If you do not use tobacco, do not start.  Lung cancer screening is recommended for adults aged 45-80 years who are at high risk for developing lung cancer because of a history of smoking. A yearly low-dose CT scan of the lungs is recommended for people who have at least a 30-pack-year history of smoking and are a current smoker or have quit within the past 15 years. A pack year of smoking is smoking an average of 1 pack of cigarettes a day for 1 year (for example: 1 pack a day for 30 years or 2 packs a day for 15 years). Yearly screening should continue until the smoker has stopped smoking for at least 15 years. Yearly screening should be stopped for people who develop a health problem that would prevent them from having lung cancer treatment.  If you are pregnant, do not drink alcohol. If you are  breastfeeding, be very cautious about drinking alcohol. If you are not pregnant and choose to drink alcohol, do not have more than 1 drink per day. One drink is considered to be 12 ounces (355 mL) of beer, 5 ounces (148 mL) of wine, or 1.5 ounces (44 mL) of liquor.  Avoid use of street drugs. Do not share needles with anyone. Ask for help if you need support or instructions about stopping the use of drugs.  High blood pressure causes heart disease and increases the risk  of stroke. Your blood pressure should be checked at least every 1 to 2 years. Ongoing high blood pressure should be treated with medicines if weight loss and exercise do not work.  If you are 55-79 years old, ask your health care provider if you should take aspirin to prevent strokes.  Diabetes screening is done by taking a blood sample to check your blood glucose level after you have not eaten for a certain period of time (fasting). If you are not overweight and you do not have risk factors for diabetes, you should be screened once every 3 years starting at age 45. If you are overweight or obese and you are 40-70 years of age, you should be screened for diabetes every year as part of your cardiovascular risk assessment.  Breast cancer screening is essential preventive care for women. You should practice "breast self-awareness." This means understanding the normal appearance and feel of your breasts and may include breast self-examination. Any changes detected, no matter how small, should be reported to a health care provider. Women in their 20s and 30s should have a clinical breast exam (CBE) by a health care provider as part of a regular health exam every 1 to 3 years. After age 40, women should have a CBE every year. Starting at age 40, women should consider having a mammogram (breast X-ray test) every year. Women who have a family history of breast cancer should talk to their health care provider about genetic screening. Women at a high risk of breast cancer should talk to their health care providers about having an MRI and a mammogram every year.  Breast cancer gene (BRCA)-related cancer risk assessment is recommended for women who have family members with BRCA-related cancers. BRCA-related cancers include breast, ovarian, tubal, and peritoneal cancers. Having family members with these cancers may be associated with an increased risk for harmful changes (mutations) in the breast cancer genes BRCA1 and  BRCA2. Results of the assessment will determine the need for genetic counseling and BRCA1 and BRCA2 testing.  Your health care provider may recommend that you be screened regularly for cancer of the pelvic organs (ovaries, uterus, and vagina). This screening involves a pelvic examination, including checking for microscopic changes to the surface of your cervix (Pap test). You may be encouraged to have this screening done every 3 years, beginning at age 21.  For women ages 30-65, health care providers may recommend pelvic exams and Pap testing every 3 years, or they may recommend the Pap and pelvic exam, combined with testing for human papilloma virus (HPV), every 5 years. Some types of HPV increase your risk of cervical cancer. Testing for HPV may also be done on women of any age with unclear Pap test results.  Other health care providers may not recommend any screening for nonpregnant women who are considered low risk for pelvic cancer and who do not have symptoms. Ask your health care provider if a screening pelvic exam is right for   you.  If you have had past treatment for cervical cancer or a condition that could lead to cancer, you need Pap tests and screening for cancer for at least 20 years after your treatment. If Pap tests have been discontinued, your risk factors (such as having a new sexual partner) need to be reassessed to determine if screening should resume. Some women have medical problems that increase the chance of getting cervical cancer. In these cases, your health care provider may recommend more frequent screening and Pap tests.  Colorectal cancer can be detected and often prevented. Most routine colorectal cancer screening begins at the age of 50 years and continues through age 75 years. However, your health care provider may recommend screening at an earlier age if you have risk factors for colon cancer. On a yearly basis, your health care provider may provide home test kits to check  for hidden blood in the stool. Use of a small camera at the end of a tube, to directly examine the colon (sigmoidoscopy or colonoscopy), can detect the earliest forms of colorectal cancer. Talk to your health care provider about this at age 50, when routine screening begins. Direct exam of the colon should be repeated every 5-10 years through age 75 years, unless early forms of precancerous polyps or small growths are found.  People who are at an increased risk for hepatitis B should be screened for this virus. You are considered at high risk for hepatitis B if:  You were born in a country where hepatitis B occurs often. Talk with your health care provider about which countries are considered high risk.  Your parents were born in a high-risk country and you have not received a shot to protect against hepatitis B (hepatitis B vaccine).  You have HIV or AIDS.  You use needles to inject street drugs.  You live with, or have sex with, someone who has hepatitis B.  You get hemodialysis treatment.  You take certain medicines for conditions like cancer, organ transplantation, and autoimmune conditions.  Hepatitis C blood testing is recommended for all people born from 1945 through 1965 and any individual with known risks for hepatitis C.  Practice safe sex. Use condoms and avoid high-risk sexual practices to reduce the spread of sexually transmitted infections (STIs). STIs include gonorrhea, chlamydia, syphilis, trichomonas, herpes, HPV, and human immunodeficiency virus (HIV). Herpes, HIV, and HPV are viral illnesses that have no cure. They can result in disability, cancer, and death.  You should be screened for sexually transmitted illnesses (STIs) including gonorrhea and chlamydia if:  You are sexually active and are younger than 24 years.  You are older than 24 years and your health care provider tells you that you are at risk for this type of infection.  Your sexual activity has changed  since you were last screened and you are at an increased risk for chlamydia or gonorrhea. Ask your health care provider if you are at risk.  If you are at risk of being infected with HIV, it is recommended that you take a prescription medicine daily to prevent HIV infection. This is called preexposure prophylaxis (PrEP). You are considered at risk if:  You are sexually active and do not regularly use condoms or know the HIV status of your partner(s).  You take drugs by injection.  You are sexually active with a partner who has HIV.  Talk with your health care provider about whether you are at high risk of being infected with HIV. If   you choose to begin PrEP, you should first be tested for HIV. You should then be tested every 3 months for as long as you are taking PrEP.  Osteoporosis is a disease in which the bones lose minerals and strength with aging. This can result in serious bone fractures or breaks. The risk of osteoporosis can be identified using a bone density scan. Women ages 67 years and over and women at risk for fractures or osteoporosis should discuss screening with their health care providers. Ask your health care provider whether you should take a calcium supplement or vitamin D to reduce the rate of osteoporosis.  Menopause can be associated with physical symptoms and risks. Hormone replacement therapy is available to decrease symptoms and risks. You should talk to your health care provider about whether hormone replacement therapy is right for you.  Use sunscreen. Apply sunscreen liberally and repeatedly throughout the day. You should seek shade when your shadow is shorter than you. Protect yourself by wearing long sleeves, pants, a wide-brimmed hat, and sunglasses year round, whenever you are outdoors.  Once a month, do a whole body skin exam, using a mirror to look at the skin on your back. Tell your health care provider of new moles, moles that have irregular borders, moles that  are larger than a pencil eraser, or moles that have changed in shape or color.  Stay current with required vaccines (immunizations).  Influenza vaccine. All adults should be immunized every year.  Tetanus, diphtheria, and acellular pertussis (Td, Tdap) vaccine. Pregnant women should receive 1 dose of Tdap vaccine during each pregnancy. The dose should be obtained regardless of the length of time since the last dose. Immunization is preferred during the 27th-36th week of gestation. An adult who has not previously received Tdap or who does not know her vaccine status should receive 1 dose of Tdap. This initial dose should be followed by tetanus and diphtheria toxoids (Td) booster doses every 10 years. Adults with an unknown or incomplete history of completing a 3-dose immunization series with Td-containing vaccines should begin or complete a primary immunization series including a Tdap dose. Adults should receive a Td booster every 10 years.  Varicella vaccine. An adult without evidence of immunity to varicella should receive 2 doses or a second dose if she has previously received 1 dose. Pregnant females who do not have evidence of immunity should receive the first dose after pregnancy. This first dose should be obtained before leaving the health care facility. The second dose should be obtained 4-8 weeks after the first dose.  Human papillomavirus (HPV) vaccine. Females aged 13-26 years who have not received the vaccine previously should obtain the 3-dose series. The vaccine is not recommended for use in pregnant females. However, pregnancy testing is not needed before receiving a dose. If a female is found to be pregnant after receiving a dose, no treatment is needed. In that case, the remaining doses should be delayed until after the pregnancy. Immunization is recommended for any person with an immunocompromised condition through the age of 61 years if she did not get any or all doses earlier. During the  3-dose series, the second dose should be obtained 4-8 weeks after the first dose. The third dose should be obtained 24 weeks after the first dose and 16 weeks after the second dose.  Zoster vaccine. One dose is recommended for adults aged 30 years or older unless certain conditions are present.  Measles, mumps, and rubella (MMR) vaccine. Adults born  before 1957 generally are considered immune to measles and mumps. Adults born in 1957 or later should have 1 or more doses of MMR vaccine unless there is a contraindication to the vaccine or there is laboratory evidence of immunity to each of the three diseases. A routine second dose of MMR vaccine should be obtained at least 28 days after the first dose for students attending postsecondary schools, health care workers, or international travelers. People who received inactivated measles vaccine or an unknown type of measles vaccine during 1963-1967 should receive 2 doses of MMR vaccine. People who received inactivated mumps vaccine or an unknown type of mumps vaccine before 1979 and are at high risk for mumps infection should consider immunization with 2 doses of MMR vaccine. For females of childbearing age, rubella immunity should be determined. If there is no evidence of immunity, females who are not pregnant should be vaccinated. If there is no evidence of immunity, females who are pregnant should delay immunization until after pregnancy. Unvaccinated health care workers born before 1957 who lack laboratory evidence of measles, mumps, or rubella immunity or laboratory confirmation of disease should consider measles and mumps immunization with 2 doses of MMR vaccine or rubella immunization with 1 dose of MMR vaccine.  Pneumococcal 13-valent conjugate (PCV13) vaccine. When indicated, a person who is uncertain of his immunization history and has no record of immunization should receive the PCV13 vaccine. All adults 65 years of age and older should receive this  vaccine. An adult aged 19 years or older who has certain medical conditions and has not been previously immunized should receive 1 dose of PCV13 vaccine. This PCV13 should be followed with a dose of pneumococcal polysaccharide (PPSV23) vaccine. Adults who are at high risk for pneumococcal disease should obtain the PPSV23 vaccine at least 8 weeks after the dose of PCV13 vaccine. Adults older than 58 years of age who have normal immune system function should obtain the PPSV23 vaccine dose at least 1 year after the dose of PCV13 vaccine.  Pneumococcal polysaccharide (PPSV23) vaccine. When PCV13 is also indicated, PCV13 should be obtained first. All adults aged 65 years and older should be immunized. An adult younger than age 65 years who has certain medical conditions should be immunized. Any person who resides in a nursing home or long-term care facility should be immunized. An adult smoker should be immunized. People with an immunocompromised condition and certain other conditions should receive both PCV13 and PPSV23 vaccines. People with human immunodeficiency virus (HIV) infection should be immunized as soon as possible after diagnosis. Immunization during chemotherapy or radiation therapy should be avoided. Routine use of PPSV23 vaccine is not recommended for American Indians, [place] Natives, or people younger than 65 years unless there are medical conditions that require PPSV23 vaccine. When indicated, people who have unknown immunization and have no record of immunization should receive PPSV23 vaccine. One-time revaccination 5 years after the first dose of PPSV23 is recommended for people aged 19-64 years who have chronic kidney failure, nephrotic syndrome, asplenia, or immunocompromised conditions. People who received 1-2 doses of PPSV23 before age 65 years should receive another dose of PPSV23 vaccine at age 65 years or later if at least 5 years have passed since the previous dose. Doses of PPSV23 are not  needed for people immunized with PPSV23 at or after age 65 years.  Meningococcal vaccine. Adults with asplenia or persistent complement component deficiencies should receive 2 doses of quadrivalent meningococcal conjugate (MenACWY-D) vaccine. The doses should be obtained   at least 2 months apart. Microbiologists working with certain meningococcal bacteria, [place] recruits, people at risk during an outbreak, and people who travel to or live in countries with a high rate of meningitis should be immunized. A first-year college student up through age 34 years who is living in a residence hall should receive a dose if she did not receive a dose on or after her 16th birthday. Adults who have certain high-risk conditions should receive one or more doses of vaccine.  Hepatitis A vaccine. Adults who wish to be protected from this disease, have certain high-risk conditions, work with hepatitis A-infected animals, work in hepatitis A research labs, or travel to or work in countries with a high rate of hepatitis A should be immunized. Adults who were previously unvaccinated and who anticipate close contact with an international adoptee during the first 60 days after arrival in the [place] from a country with a high rate of hepatitis A should be immunized.  Hepatitis B vaccine. Adults who wish to be protected from this disease, have certain high-risk conditions, may be exposed to blood or other infectious body fluids, are household contacts or sex partners of hepatitis B positive people, are clients or workers in certain care facilities, or travel to or work in countries with a high rate of hepatitis B should be immunized.  Haemophilus influenzae type b (Hib) vaccine. A previously unvaccinated person with asplenia or sickle cell disease or having a scheduled splenectomy should receive 1 dose of Hib vaccine. Regardless of previous immunization, a recipient of a hematopoietic stem cell transplant should receive a  3-dose series 6-12 months after her successful transplant. Hib vaccine is not recommended for adults with HIV infection. Preventive Services / Frequency Ages 35 to 4 years  Blood pressure check.** / Every 3-5 years.  Lipid and cholesterol check.** / Every 5 years beginning at age 60.  Clinical breast exam.** / Every 3 years for women in their 71s and 10s.  BRCA-related cancer risk assessment.** / For women who have family members with a BRCA-related cancer (breast, ovarian, tubal, or peritoneal cancers).  Pap test.** / Every 2 years from ages 76 through 26. Every 3 years starting at age 61 through age 76 or 93 with a history of 3 consecutive normal Pap tests.  HPV screening.** / Every 3 years from ages 37 through ages 60 to 51 with a history of 3 consecutive normal Pap tests.  Hepatitis C blood test.** / For any individual with known risks for hepatitis C.  Skin self-exam. / Monthly.  Influenza vaccine. / Every year.  Tetanus, diphtheria, and acellular pertussis (Tdap, Td) vaccine.** / Consult your health care provider. Pregnant women should receive 1 dose of Tdap vaccine during each pregnancy. 1 dose of Td every 10 years.  Varicella vaccine.** / Consult your health care provider. Pregnant females who do not have evidence of immunity should receive the first dose after pregnancy.  HPV vaccine. / 3 doses over 6 months, if 93 and younger. The vaccine is not recommended for use in pregnant females. However, pregnancy testing is not needed before receiving a dose.  Measles, mumps, rubella (MMR) vaccine.** / You need at least 1 dose of MMR if you were born in 1957 or later. You may also need a 2nd dose. For females of childbearing age, rubella immunity should be determined. If there is no evidence of immunity, females who are not pregnant should be vaccinated. If there is no evidence of immunity, females who are  pregnant should delay immunization until after pregnancy.  Pneumococcal  13-valent conjugate (PCV13) vaccine.** / Consult your health care provider.  Pneumococcal polysaccharide (PPSV23) vaccine.** / 1 to 2 doses if you smoke cigarettes or if you have certain conditions.  Meningococcal vaccine.** / 1 dose if you are age 68 to 8 years and a Market researcher living in a residence hall, or have one of several medical conditions, you need to get vaccinated against meningococcal disease. You may also need additional booster doses.  Hepatitis A vaccine.** / Consult your health care provider.  Hepatitis B vaccine.** / Consult your health care provider.  Haemophilus influenzae type b (Hib) vaccine.** / Consult your health care provider. Ages 7 to 53 years  Blood pressure check.** / Every year.  Lipid and cholesterol check.** / Every 5 years beginning at age 25 years.  Lung cancer screening. / Every year if you are aged 11-80 years and have a 30-pack-year history of smoking and currently smoke or have quit within the past 15 years. Yearly screening is stopped once you have quit smoking for at least 15 years or develop a health problem that would prevent you from having lung cancer treatment.  Clinical breast exam.** / Every year after age 48 years.  BRCA-related cancer risk assessment.** / For women who have family members with a BRCA-related cancer (breast, ovarian, tubal, or peritoneal cancers).  Mammogram.** / Every year beginning at age 41 years and continuing for as long as you are in good health. Consult with your health care provider.  Pap test.** / Every 3 years starting at age 65 years through age 37 or 70 years with a history of 3 consecutive normal Pap tests.  HPV screening.** / Every 3 years from ages 72 years through ages 60 to 40 years with a history of 3 consecutive normal Pap tests.  Fecal occult blood test (FOBT) of stool. / Every year beginning at age 21 years and continuing until age 5 years. You may not need to do this test if you get  a colonoscopy every 10 years.  Flexible sigmoidoscopy or colonoscopy.** / Every 5 years for a flexible sigmoidoscopy or every 10 years for a colonoscopy beginning at age 35 years and continuing until age 48 years.  Hepatitis C blood test.** / For all people born from [birth year] through 1965 and any individual with known risks for hepatitis C.  Skin self-exam. / Monthly.  Influenza vaccine. / Every year.  Tetanus, diphtheria, and acellular pertussis (Tdap/Td) vaccine.** / Consult your health care provider. Pregnant women should receive 1 dose of Tdap vaccine during each pregnancy. 1 dose of Td every 10 years.  Varicella vaccine.** / Consult your health care provider. Pregnant females who do not have evidence of immunity should receive the first dose after pregnancy.  Zoster vaccine.** / 1 dose for adults aged 30 years or older.  Measles, mumps, rubella (MMR) vaccine.** / You need at least 1 dose of MMR if you were born in 1956 or later. You may also need a second dose. For females of childbearing age, rubella immunity should be determined. If there is no evidence of immunity, females who are not pregnant should be vaccinated. If there is no evidence of immunity, females who are pregnant should delay immunization until after pregnancy.  Pneumococcal 13-valent conjugate (PCV13) vaccine.** / Consult your health care provider.  Pneumococcal polysaccharide (PPSV23) vaccine.** / 1 to 2 doses if you smoke cigarettes or if you have certain conditions.  Meningococcal vaccine.** /  Consult your health care provider.  Hepatitis A vaccine.** / Consult your health care provider.  Hepatitis B vaccine.** / Consult your health care provider.  Haemophilus influenzae type b (Hib) vaccine.** / Consult your health care provider. Ages 65 years and over  Blood pressure check.** / Every year.  Lipid and cholesterol check.** / Every 5 years beginning at age 20 years.  Lung cancer screening. / Every year if you  are aged 55-80 years and have a 30-pack-year history of smoking and currently smoke or have quit within the past 15 years. Yearly screening is stopped once you have quit smoking for at least 15 years or develop a health problem that would prevent you from having lung cancer treatment.  Clinical breast exam.** / Every year after age 40 years.  BRCA-related cancer risk assessment.** / For women who have family members with a BRCA-related cancer (breast, ovarian, tubal, or peritoneal cancers).  Mammogram.** / Every year beginning at age 40 years and continuing for as long as you are in good health. Consult with your health care provider.  Pap test.** / Every 3 years starting at age 30 years through age 65 or 70 years with 3 consecutive normal Pap tests. Testing can be stopped between 65 and 70 years with 3 consecutive normal Pap tests and no abnormal Pap or HPV tests in the past 10 years.  HPV screening.** / Every 3 years from ages 30 years through ages 65 or 70 years with a history of 3 consecutive normal Pap tests. Testing can be stopped between 65 and 70 years with 3 consecutive normal Pap tests and no abnormal Pap or HPV tests in the past 10 years.  Fecal occult blood test (FOBT) of stool. / Every year beginning at age 50 years and continuing until age 75 years. You may not need to do this test if you get a colonoscopy every 10 years.  Flexible sigmoidoscopy or colonoscopy.** / Every 5 years for a flexible sigmoidoscopy or every 10 years for a colonoscopy beginning at age 50 years and continuing until age 75 years.  Hepatitis C blood test.** / For all people born from 1945 through 1965 and any individual with known risks for hepatitis C.  Osteoporosis screening.** / A one-time screening for women ages 65 years and over and women at risk for fractures or osteoporosis.  Skin self-exam. / Monthly.  Influenza vaccine. / Every year.  Tetanus, diphtheria, and acellular pertussis (Tdap/Td)  vaccine.** / 1 dose of Td every 10 years.  Varicella vaccine.** / Consult your health care provider.  Zoster vaccine.** / 1 dose for adults aged 60 years or older.  Pneumococcal 13-valent conjugate (PCV13) vaccine.** / Consult your health care provider.  Pneumococcal polysaccharide (PPSV23) vaccine.** / 1 dose for all adults aged 65 years and older.  Meningococcal vaccine.** / Consult your health care provider.  Hepatitis A vaccine.** / Consult your health care provider.  Hepatitis B vaccine.** / Consult your health care provider.  Haemophilus influenzae type b (Hib) vaccine.** / Consult your health care provider. ** Family history and personal history of risk and conditions may change your health care provider's recommendations.   This information is not intended to replace advice given to you by your health care provider. Make sure you discuss any questions you have with your health care provider.   Document Released: 04/17/2001 Document Revised: 03/12/2014 Document Reviewed: 07/17/2010 Elsevier Interactive Patient Education 2016 Elsevier [employer] Smoking Cessation, Tips for Success If you are ready to quit smoking,   congratulations! You have chosen to help yourself be healthier. Cigarettes bring nicotine, tar, carbon monoxide, and other irritants into your body. Your lungs, heart, and blood vessels will be able to work better without these poisons. There are many different ways to quit smoking. Nicotine gum, nicotine patches, a nicotine inhaler, or nicotine nasal spray can help with physical craving. Hypnosis, support groups, and medicines help break the habit of smoking. WHAT THINGS CAN I DO TO MAKE QUITTING EASIER?  Here are some tips to help you quit for good:  Pick a date when you will quit smoking completely. Tell all of your friends and family about your plan to quit on that date.  Do not try to slowly cut down on the number of cigarettes you are smoking. Pick a quit date and quit  smoking completely starting on that day.  Throw away all cigarettes.   Clean and remove all ashtrays from your home, work, and car.  On a card, write down your reasons for quitting. Carry the card with you and read it when you get the urge to smoke.  Cleanse your body of nicotine. Drink enough water and fluids to keep your urine clear or pale yellow. Do this after quitting to flush the nicotine from your body.  Learn to predict your moods. Do not let a bad situation be your excuse to have a cigarette. Some situations in your life might tempt you into wanting a cigarette.  Never have "just one" cigarette. It leads to wanting another and another. Remind yourself of your decision to quit.  Change habits associated with smoking. If you smoked while driving or when feeling stressed, try other activities to replace smoking. Stand up when drinking your coffee. Brush your teeth after eating. Sit in a different chair when you read the paper. Avoid alcohol while trying to quit, and try to drink fewer caffeinated beverages. Alcohol and caffeine may urge you to smoke.  Avoid foods and drinks that can trigger a desire to smoke, such as sugary or spicy foods and alcohol.  Ask people who smoke not to smoke around you.  Have something planned to do right after eating or having a cup of coffee. For example, plan to take a walk or exercise.  Try a relaxation exercise to calm you down and decrease your stress. Remember, you may be tense and nervous for the first 2 weeks after you quit, but this will pass.  Find new activities to keep your hands busy. Play with a pen, coin, or rubber band. Doodle or draw things on paper.  Brush your teeth right after eating. This will help cut down on the craving for the taste of tobacco after meals. You can also try mouthwash.   Use oral substitutes in place of cigarettes. Try using lemon drops, carrots, cinnamon sticks, or chewing gum. Keep them handy so they are  available when you have the urge to smoke.  When you have the urge to smoke, try deep breathing.  Designate your home as a nonsmoking area.  If you are a heavy smoker, ask your health care provider about a prescription for nicotine chewing gum. It can ease your withdrawal from nicotine.  Reward yourself. Set aside the cigarette money you save and buy yourself something nice.  Look for support from others. Join a support group or smoking cessation program. Ask someone at home or at work to help you with your plan to quit smoking.  Always ask yourself, "Do I need this   this cigarette or is this just a reflex?" Tell yourself, "Today, I choose not to smoke," or "I do not want to smoke." You are reminding yourself of your decision to quit.  Do not replace cigarette smoking with electronic cigarettes (commonly called e-cigarettes). The safety of e-cigarettes is unknown, and some may contain harmful chemicals.  If you relapse, do not give up! Plan ahead and think about what you will do the next time you get the urge to smoke. HOW WILL I FEEL WHEN I QUIT SMOKING? You may have symptoms of withdrawal because your body is used to nicotine (the addictive substance in cigarettes). You may crave cigarettes, be irritable, feel very hungry, cough often, get headaches, or have difficulty concentrating. The withdrawal symptoms are only temporary. They are strongest when you first quit but will go away within 10-14 days. When withdrawal symptoms occur, stay in control. Think about your reasons for quitting. Remind yourself that these are signs that your body is healing and getting used to being without cigarettes. Remember that withdrawal symptoms are easier to treat than the major diseases that smoking can cause.  Even after the withdrawal is over, expect periodic urges to smoke. However, these cravings are generally short lived and will go away whether you smoke or not. Do not smoke! WHAT RESOURCES ARE AVAILABLE TO  HELP ME QUIT SMOKING? Your health care provider can direct you to community resources or hospitals for support, which may include:  Group support.  Education.  Hypnosis.  Therapy.   This information is not intended to replace advice given to you by your health care provider. Make sure you discuss any questions you have with your health care provider.   Document Released: 11/18/2003 Document Revised: 03/12/2014 Document Reviewed: 08/07/2012 Elsevier Interactive Patient Education [employer].

## 2015-02-16 ENCOUNTER — Other Ambulatory Visit: Payer: Self-pay

## 2015-02-16 LAB — HEPATITIS C ANTIBODY: HCV Ab: NEGATIVE

## 2015-02-16 LAB — CYTOLOGY - PAP

## 2015-02-16 MED ORDER — CIPROFLOXACIN HCL 250 MG PO TABS: 250.0000 mg | ORAL_TABLET | Freq: Two times a day (BID) | ORAL | Status: DC

## 2015-02-17 LAB — URINE CULTURE: Colony Count: 40000

## 2015-02-22 NOTE — Telephone Encounter (Signed)
Requested medication sent to pharmacy.

## 2015-06-27 DIAGNOSIS — L2389 Allergic contact dermatitis due to other agents: Secondary | ICD-10-CM | POA: Diagnosis not present

## 2015-06-29 DIAGNOSIS — H35413 Lattice degeneration of retina, bilateral: Secondary | ICD-10-CM | POA: Diagnosis not present

## 2015-11-23 DIAGNOSIS — H04123 Dry eye syndrome of bilateral lacrimal glands: Secondary | ICD-10-CM | POA: Diagnosis not present

## 2015-11-23 DIAGNOSIS — H35413 Lattice degeneration of retina, bilateral: Secondary | ICD-10-CM | POA: Diagnosis not present

## 2015-11-23 DIAGNOSIS — H2513 Age-related nuclear cataract, bilateral: Secondary | ICD-10-CM | POA: Diagnosis not present

## 2015-11-23 DIAGNOSIS — H02055 Trichiasis without entropian left lower eyelid: Secondary | ICD-10-CM | POA: Diagnosis not present

## 2016-01-05 ENCOUNTER — Encounter: Payer: Self-pay | Admitting: Family Medicine

## 2016-01-09 DIAGNOSIS — Z1231 Encounter for screening mammogram for malignant neoplasm of breast: Secondary | ICD-10-CM | POA: Diagnosis not present

## 2016-02-20 ENCOUNTER — Encounter: Payer: Medicare Other | Admitting: Family Medicine

## 2016-02-21 ENCOUNTER — Encounter: Payer: Medicare Other | Admitting: Family Medicine

## 2016-03-26 ENCOUNTER — Ambulatory Visit (INDEPENDENT_AMBULATORY_CARE_PROVIDER_SITE_OTHER): Payer: Medicare Other | Admitting: Family Medicine

## 2016-03-26 ENCOUNTER — Encounter: Payer: Self-pay | Admitting: Family Medicine

## 2016-03-26 VITALS — BP 118/68 | HR 79 | Temp 98.0°F | Resp 16 | Ht 67.0 in | Wt 170.6 lb

## 2016-03-26 DIAGNOSIS — Z0000 Encounter for general adult medical examination without abnormal findings: Secondary | ICD-10-CM

## 2016-03-26 DIAGNOSIS — E785 Hyperlipidemia, unspecified: Secondary | ICD-10-CM

## 2016-03-26 DIAGNOSIS — E781 Pure hyperglyceridemia: Secondary | ICD-10-CM | POA: Diagnosis not present

## 2016-03-26 DIAGNOSIS — Z96643 Presence of artificial hip joint, bilateral: Secondary | ICD-10-CM

## 2016-03-26 DIAGNOSIS — Z87891 Personal history of nicotine dependence: Secondary | ICD-10-CM | POA: Diagnosis not present

## 2016-03-26 MED ORDER — AMOXICILLIN 500 MG PO CAPS: ORAL_CAPSULE | ORAL | 1 refills | Status: DC

## 2016-03-26 NOTE — Patient Instructions (Signed)

## 2016-03-26 NOTE — Progress Notes (Signed)
Subjective:    Patient ID: Ashley Salinas, female    DOB: Jan 18, 1956, 61 y.o.   MRN: [id number]  Chief Complaint  Patient presents with  . Annual Exam    last time eating 8:30 am.    HPI Patient is in today for annual physical.  No complaints.   Past Medical History:  Diagnosis Date  . Depression     Past Surgical History:  Procedure Laterality Date  . APPENDECTOMY  1990  . BREAST ENHANCEMENT SURGERY    . LUMBAR FUSION  03/21/2007  . SMALL INTESTINE SURGERY  1990  . TOTAL HIP ARTHROPLASTY  10/11/2010,  01/2011   Left- Ashley Salinas--- revision  . TOTAL HIP ARTHROPLASTY     Left- Ashley Salinas  . TUBAL LIGATION  2004    Family History  Problem Relation Age of Onset  . Breast cancer Mother   . Cancer Mother 12    breast  . Arthritis Father   . Cancer Father     multiple myeloma  stage 3B    Social History   Social History  . Marital status: Widowed    Spouse name: N/A  . Number of children: N/A  . Years of education: N/A   Occupational History  .      unemployed-- looking for another job   Social History Main Topics  . Smoking status: Current Some Day Smoker    Packs/day: 0.50    Years: 35.00    Types: Cigarettes    Last attempt to quit: 10/04/2010  . Smokeless tobacco: Never Used     Comment: smoking on and off-- few cig here and there  . Alcohol use 0.6 oz/week    1 Glasses of wine per week     Comment: rare-- actually < 1x   . Drug use: No  . Sexual activity: Yes    Partners: Male   Other Topics Concern  . Not on file   Social History Narrative   Exercise-  no    Outpatient Medications Prior to Visit  Medication Sig Dispense Refill  . Multiple Vitamin (MULTIVITAMIN) capsule Take 1 capsule by mouth daily.    . ciprofloxacin (CIPRO) 250 MG tablet Take 1 tablet (250 mg total) by mouth 2 (two) times daily. 6 tablet 0  . varenicline (CHANTIX STARTING MONTH PAK) 0.5 MG X 11 & 1 MG X 42 tablet Take one 0.5 mg tablet by mouth once daily for 3 days, then  increase to one 0.5 mg tablet twice daily for 4 days, then increase to one 1 mg tablet twice daily. (Patient not taking: Reported on 03/26/2016) 53 tablet 0   No facility-administered medications prior to visit.     Allergies  Allergen Reactions  . Azithromycin Nausea Only  . Oxycodone Nausea Only  . Naproxen Rash    Review of Systems  Constitutional: Negative for fever.  HENT: Negative for congestion.   Eyes: Negative for blurred vision.  Respiratory: Negative for cough.   Cardiovascular: Negative for chest pain and palpitations.  Gastrointestinal: Negative for vomiting.  Musculoskeletal: Negative.  Negative for back pain.  Skin: Negative for rash.  Neurological: Negative for loss of consciousness and headaches.  Endo/Heme/Allergies: Negative.   Psychiatric/Behavioral: Negative.        Objective:    Physical Exam  Constitutional: She is oriented to person, place, and time. She appears well-developed and well-nourished. No distress.  HENT:  Right Ear: External ear normal.  Left Ear: External ear normal.  Nose: Nose normal.  Mouth/Throat: Oropharynx is clear and moist.  Eyes: EOM are normal. Pupils are equal, round, and reactive to light.  Neck: Normal range of motion. Neck supple.  Cardiovascular: Normal rate, regular rhythm and normal heart sounds.   No murmur heard. Pulmonary/Chest: Effort normal and breath sounds normal. No respiratory distress. She has no wheezes. She has no rales. She exhibits no tenderness.  Genitourinary:  Genitourinary Comments: Pap/gyn-- deferred  Neurological: She is alert and oriented to person, place, and time.  Psychiatric: She has a normal mood and affect. Her behavior is normal. Judgment and thought content normal.  Nursing note and vitals reviewed.   BP 118/68 (BP Location: Right Arm, Patient Position: Sitting, Cuff Size: Normal)   Pulse 79   Temp 98 F (36.7 C) (Oral)   Resp 16   Ht '5\' 7"'  (1.702 m)   Wt 170 lb 9.6 oz (77.4 kg)    SpO2 98%   BMI 26.72 kg/m  Wt Readings from Last 3 Encounters:  03/26/16 170 lb 9.6 oz (77.4 kg)  02/15/15 164 lb 12.8 oz (74.8 kg)  02/11/14 159 lb (72.1 kg)     Lab Results  Component Value Date   WBC 8.0 02/11/2014   HGB 14.3 02/11/2014   HCT 43.6 02/11/2014   PLT 264.0 02/11/2014   GLUCOSE 91 02/15/2015   CHOL 205 (H) 02/15/2015   TRIG 168.0 (H) 02/15/2015   HDL 73.90 02/15/2015   LDLDIRECT 112.0 08/17/2009   LDLCALC 98 02/15/2015   ALT 14 02/15/2015   AST 21 02/15/2015   NA 141 02/15/2015   K 4.1 02/15/2015   CL 107 02/15/2015   CREATININE 0.87 02/15/2015   BUN 18 02/15/2015   CO2 26 02/15/2015   TSH 1.49 02/11/2014    Lab Results  Component Value Date   TSH 1.49 02/11/2014   Lab Results  Component Value Date   WBC 8.0 02/11/2014   HGB 14.3 02/11/2014   HCT 43.6 02/11/2014   MCV 98.5 02/11/2014   PLT 264.0 02/11/2014   Lab Results  Component Value Date   NA 141 02/15/2015   K 4.1 02/15/2015   CO2 26 02/15/2015   GLUCOSE 91 02/15/2015   BUN 18 02/15/2015   CREATININE 0.87 02/15/2015   BILITOT 0.5 02/15/2015   ALKPHOS 49 02/15/2015   AST 21 02/15/2015   ALT 14 02/15/2015   PROT 7.1 02/15/2015   ALBUMIN 4.4 02/15/2015   CALCIUM 9.6 02/15/2015   GFR 71.03 02/15/2015   Lab Results  Component Value Date   CHOL 205 (H) 02/15/2015   Lab Results  Component Value Date   HDL 73.90 02/15/2015   Lab Results  Component Value Date   LDLCALC 98 02/15/2015   Lab Results  Component Value Date   TRIG 168.0 (H) 02/15/2015   Lab Results  Component Value Date   CHOLHDL 3 02/15/2015   No results found for: HGBA1C     Assessment & Plan:   Problem List Items Addressed This Visit    None    Visit Diagnoses    Preventative health care    -  Primary   Relevant Orders   Ambulatory referral to Gastroenterology   Pure hyperglyceridemia       Hyperlipidemia, unspecified hyperlipidemia type       Relevant Orders   Lipid panel   Comprehensive  metabolic panel   Hx of smoking       Relevant Orders   Ambulatory Referral for Lung Cancer Scre   History  of bilateral hip replacements       Relevant Medications   amoxicillin (AMOXIL) 500 MG capsule      I have discontinued Ashley Salinas ciprofloxacin. I am also having her start on amoxicillin. Additionally, I am having her maintain her multivitamin and varenicline.  Meds ordered this encounter  Medications  . amoxicillin (AMOXIL) 500 MG capsule    Sig: 4 po 1 hour prior to dental work    Dispense:  10 capsule    Refill:  1    CMA served as Education administrator during this visit. History, Physical and Plan performed by medical provider. Documentation and orders reviewed and attested to.   [name], DO

## 2016-03-26 NOTE — Progress Notes (Signed)
Pre visit review using our clinic review tool, if applicable. No additional management support is needed unless otherwise documented below in the visit note. 

## 2016-03-27 LAB — COMPREHENSIVE METABOLIC PANEL
ALK PHOS: 49 U/L (ref 39–117)
ALT: 16 U/L (ref 0–35)
AST: 22 U/L (ref 0–37)
Albumin: 4.5 g/dL (ref 3.5–5.2)
BUN: 20 mg/dL (ref 6–23)
CALCIUM: 9.8 mg/dL (ref 8.4–10.5)
CO2: 25 meq/L (ref 19–32)
Chloride: 108 mEq/L (ref 96–112)
Creatinine, Ser: 0.9 mg/dL (ref 0.40–1.20)
GFR: 68.04 mL/min (ref >60.00)
GLUCOSE: 76 mg/dL (ref 70–99)
POTASSIUM: 4.2 meq/L (ref 3.5–5.1)
Sodium: 138 mEq/L (ref 135–145)
Total Bilirubin: 0.3 mg/dL (ref 0.2–1.2)
Total Protein: 7.4 g/dL (ref 6.0–8.3)

## 2016-03-27 LAB — LIPID PANEL
CHOL/HDL RATIO: 3
Cholesterol: 205 mg/dL — ABNORMAL HIGH (ref 0–200)
HDL: 70.9 mg/dL (ref >39.00)
NONHDL: 133.65
TRIGLYCERIDES: 250 mg/dL — AB (ref 0.0–149.0)
VLDL: 50 mg/dL — ABNORMAL HIGH (ref 0.0–40.0)

## 2016-03-27 LAB — LDL CHOLESTEROL, DIRECT: LDL DIRECT: 108 mg/dL

## 2016-03-29 ENCOUNTER — Telehealth: Payer: Self-pay | Admitting: Gastroenterology

## 2016-03-29 NOTE — Telephone Encounter (Signed)
Received colon/path report. Dr. Loletha Carrow is Doc of the Day for 03/26/16 pm. Records placed on his desk for review.

## 2016-04-03 ENCOUNTER — Encounter: Payer: Self-pay | Admitting: Family Medicine

## 2016-04-03 NOTE — Telephone Encounter (Signed)
Dr. Loletha Carrow reviewed records and has accepted patient. Left message for patient to return my call. We do have additional questions for patient before scheduling.

## 2016-04-05 NOTE — Telephone Encounter (Signed)
Was referral done

## 2016-04-09 ENCOUNTER — Telehealth: Payer: Self-pay | Admitting: Acute Care

## 2016-04-09 DIAGNOSIS — F17210 Nicotine dependence, cigarettes, uncomplicated: Principal | ICD-10-CM

## 2016-04-10 NOTE — Telephone Encounter (Signed)
Will forward to lung screening pool

## 2016-04-12 NOTE — Telephone Encounter (Signed)
Received other records from [facility] and placed on Dr.Danis's desk for review.

## 2016-04-13 NOTE — Telephone Encounter (Signed)
Patient returned call, [phone].

## 2016-04-13 NOTE — Telephone Encounter (Signed)
Spoke with pt.  SD[facility] scheduled for 04/23/16 at 3:00 CT ordered Nothing further needed.

## 2016-04-13 NOTE — Telephone Encounter (Signed)
LMTC x `1 for pt 

## 2016-04-13 NOTE — Telephone Encounter (Signed)
LMTC x 1  

## 2016-04-16 NOTE — Telephone Encounter (Signed)
Dr. Loletha Carrow reviewed new records and wants to go ahead and schedule patient for direct colon. Spoke with pt and she states that she is already sch for a colon at [facility]. Records will be shredded.

## 2016-04-17 DIAGNOSIS — J018 Other acute sinusitis: Secondary | ICD-10-CM | POA: Diagnosis not present

## 2016-04-17 DIAGNOSIS — R0602 Shortness of breath: Secondary | ICD-10-CM | POA: Diagnosis not present

## 2016-04-17 DIAGNOSIS — R21 Rash and other nonspecific skin eruption: Secondary | ICD-10-CM | POA: Diagnosis not present

## 2016-04-23 ENCOUNTER — Ambulatory Visit (INDEPENDENT_AMBULATORY_CARE_PROVIDER_SITE_OTHER)
Admission: RE | Admit: 2016-04-23 | Discharge: 2016-04-23 | Disposition: A | Discharge status: Home / Self Care | Payer: Medicare Other | Source: Ambulatory Visit | Attending: Acute Care | Admitting: Acute Care

## 2016-04-23 ENCOUNTER — Encounter: Payer: Self-pay | Admitting: Acute Care

## 2016-04-23 ENCOUNTER — Ambulatory Visit (INDEPENDENT_AMBULATORY_CARE_PROVIDER_SITE_OTHER): Payer: Medicare Other | Admitting: Acute Care

## 2016-04-23 DIAGNOSIS — Z87891 Personal history of nicotine dependence: Secondary | ICD-10-CM | POA: Diagnosis not present

## 2016-04-23 DIAGNOSIS — F17210 Nicotine dependence, cigarettes, uncomplicated: Secondary | ICD-10-CM | POA: Diagnosis not present

## 2016-04-23 NOTE — Progress Notes (Signed)
Shared Decision Making Visit Lung Cancer Screening Program [phone])   Eligibility:  Age 61 y.o.  Pack Years Smoking History Calculation 33.75 pack years smoking history (# packs/per year x # years smoked)  Recent History of coughing up blood  no  Unexplained weight loss? no ( >Than 15 pounds within the last 6 months )  Prior History Lung / other cancer no (Diagnosis within the last 5 years already requiring surveillance chest CT Scans).  Smoking Status Current Smoker  Former Smokers: Years since quit: NA  Quit Date: NA  Visit Components:  Discussion included one or more decision making aids. yes  Discussion included risk/benefits of screening. yes  Discussion included potential follow up diagnostic testing for abnormal scans. yes  Discussion included meaning and risk of over diagnosis. yes  Discussion included meaning and risk of False Positives. yes  Discussion included meaning of total radiation exposure. yes  Counseling Included:  Importance of adherence to annual lung cancer LDCT screening. yes  Impact of comorbidities on ability to participate in the program. yes  Ability and willingness to under diagnostic treatment. yes  Smoking Cessation Counseling:  Current Smokers:   Discussed importance of smoking cessation. yes  Information about tobacco cessation classes and interventions provided to patient. yes  Patient provided with "ticket" for LDCT Scan. yes  Symptomatic Patient. no  Counseling  Diagnosis Code: Tobacco Use Z72.0  Asymptomatic Patient yes  Counseling (Intermediate counseling: > three minutes counseling) [id number]  Former Smokers:   Discussed the importance of maintaining cigarette abstinence. yes  Diagnosis Code: Personal History of Nicotine Dependence. [id number]  Information about tobacco cessation classes and interventions provided to patient. Yes  Patient provided with "ticket" for LDCT Scan. yes  Written Order for Lung Cancer  Screening with LDCT placed in Epic. Yes (CT Chest Lung Cancer Screening Low Dose W/O CM) [id number] Z12.2-Screening of respiratory organs Z87.891-Personal history of nicotine dependence  I have spent 25 minutes of face to face time with Ashley Salinas discussing the risks and benefits of lung cancer screening. We viewed a power point together that explained in detail the above noted topics. We paused at intervals to allow for questions to be asked and answered to ensure understanding.We discussed that the single most powerful action that she can take to decrease her risk of developing lung cancer is to quit smoking. We discussed whether or not she is ready to commit to setting a quit date. She is currently not ready to set a quit date. We discussed options for tools to aid in quitting smoking including nicotine replacement therapy, non-nicotine medications, support groups, Quit Smart classes, and behavior modification. We discussed that often times setting smaller, more achievable goals, such as eliminating 1 cigarette a day for a week and then 2 cigarettes a day for a week can be helpful in slowly decreasing the number of cigarettes smoked. This allows for a sense of accomplishment as well as providing a clinical benefit. I gave her the " Be Stronger Than Your Excuses" card with contact information for community resources, classes, free nicotine replacement therapy, and access to mobile apps, text messaging, and on-line smoking cessation help. I have also given her my card and contact information in the event she needs to contact me. We discussed the time and location of the scan, and that either June Leap, CMA, or I will call with the results within 24-48 hours of receiving them. I have provided her with a copy of the power point we viewed  as a resource in the event they need reinforcement of the concepts we discussed today in the office. The patient verbalized understanding of all of  the above and had no  further questions upon leaving the office. They have my contact information in the event they have any further questions.  We discussed that there is a large incidence of coronary artery disease noted on these exams. Patient is currently not taking a statin, however I explained that if it was noted that she had some coronary artery disease we would notify her primary care physician and have her follow up with them. She verbalized understanding and had no further questions.  I spent 3 minutes counseling on smoking cessation at this visit.  Ashley Spatz, NP 04/23/2016

## 2016-04-24 ENCOUNTER — Telehealth: Payer: Self-pay | Admitting: Family Medicine

## 2016-04-24 ENCOUNTER — Encounter: Payer: Self-pay | Admitting: Family Medicine

## 2016-04-24 NOTE — Telephone Encounter (Signed)
Relation to [id number] Call back number:[phone]   Reason for call:  Patient states last lab results, PCP requested repeat labs, patient requesting orders, please advise

## 2016-04-26 ENCOUNTER — Encounter: Payer: Self-pay | Admitting: Acute Care

## 2016-04-27 ENCOUNTER — Other Ambulatory Visit: Payer: Self-pay | Admitting: Family Medicine

## 2016-04-27 DIAGNOSIS — E785 Hyperlipidemia, unspecified: Secondary | ICD-10-CM

## 2016-04-27 NOTE — Telephone Encounter (Signed)
Contacted the patient and lab appt made for 06/26/16 and labs entered per lab results instructions dated 03/26/16

## 2016-05-01 DIAGNOSIS — Z86010 Personal history of colonic polyps: Secondary | ICD-10-CM | POA: Diagnosis not present

## 2016-05-10 ENCOUNTER — Telehealth: Payer: Self-pay | Admitting: Acute Care

## 2016-05-10 DIAGNOSIS — F17210 Nicotine dependence, cigarettes, uncomplicated: Principal | ICD-10-CM

## 2016-05-10 NOTE — Telephone Encounter (Signed)
Spoke with pt and clarified that Pt was aware of ct results.  Future ct ordered.  Nothing further needed.

## 2016-05-10 NOTE — Telephone Encounter (Signed)
[place] x 1 ( has pt spoken with Judson Roch regarding ct results)

## 2016-05-18 DIAGNOSIS — Z86010 Personal history of colonic polyps: Secondary | ICD-10-CM | POA: Diagnosis not present

## 2016-05-24 ENCOUNTER — Encounter: Payer: Self-pay | Admitting: Family Medicine

## 2016-06-26 ENCOUNTER — Other Ambulatory Visit (INDEPENDENT_AMBULATORY_CARE_PROVIDER_SITE_OTHER): Payer: Medicare Other

## 2016-06-26 DIAGNOSIS — E785 Hyperlipidemia, unspecified: Secondary | ICD-10-CM | POA: Diagnosis not present

## 2016-06-26 LAB — LIPID PANEL
CHOL/HDL RATIO: 3
Cholesterol: 188 mg/dL (ref 0–200)
HDL: 66.4 mg/dL (ref >39.00)
NONHDL: 121.78
TRIGLYCERIDES: 211 mg/dL — AB (ref 0.0–149.0)
VLDL: 42.2 mg/dL — AB (ref 0.0–40.0)

## 2016-06-26 LAB — COMPREHENSIVE METABOLIC PANEL
ALT: 14 U/L (ref 0–35)
AST: 21 U/L (ref 0–37)
Albumin: 4.4 g/dL (ref 3.5–5.2)
Alkaline Phosphatase: 47 U/L (ref 39–117)
BUN: 16 mg/dL (ref 6–23)
CHLORIDE: 106 meq/L (ref 96–112)
CO2: 27 meq/L (ref 19–32)
CREATININE: 0.83 mg/dL (ref 0.40–1.20)
Calcium: 10 mg/dL (ref 8.4–10.5)
GFR: 74.65 mL/min (ref >60.00)
Glucose, Bld: 100 mg/dL — ABNORMAL HIGH (ref 70–99)
POTASSIUM: 3.6 meq/L (ref 3.5–5.1)
SODIUM: 139 meq/L (ref 135–145)
Total Bilirubin: 0.4 mg/dL (ref 0.2–1.2)
Total Protein: 7 g/dL (ref 6.0–8.3)

## 2016-06-26 LAB — LDL CHOLESTEROL, DIRECT: Direct LDL: 97 mg/dL

## 2016-06-27 ENCOUNTER — Other Ambulatory Visit: Payer: Self-pay | Admitting: Family Medicine

## 2016-06-27 DIAGNOSIS — E785 Hyperlipidemia, unspecified: Secondary | ICD-10-CM

## 2016-08-07 ENCOUNTER — Other Ambulatory Visit: Payer: Self-pay | Admitting: Family Medicine

## 2016-08-07 DIAGNOSIS — Z96643 Presence of artificial hip joint, bilateral: Secondary | ICD-10-CM

## 2016-08-07 MED ORDER — AMOXICILLIN 500 MG PO CAPS: ORAL_CAPSULE | ORAL | 1 refills | Status: DC

## 2016-08-14 ENCOUNTER — Ambulatory Visit (INDEPENDENT_AMBULATORY_CARE_PROVIDER_SITE_OTHER): Payer: Medicare Other | Admitting: Family Medicine

## 2016-08-14 ENCOUNTER — Encounter: Payer: Self-pay | Admitting: Family Medicine

## 2016-08-14 ENCOUNTER — Ambulatory Visit (HOSPITAL_BASED_OUTPATIENT_CLINIC_OR_DEPARTMENT_OTHER)
Admission: RE | Admit: 2016-08-14 | Discharge: 2016-08-14 | Disposition: A | Discharge status: Home / Self Care | Payer: Medicare Other | Source: Ambulatory Visit | Attending: Family Medicine | Admitting: Family Medicine

## 2016-08-14 VITALS — BP 122/76 | HR 81 | Temp 98.2°F | Resp 16 | Ht 67.0 in | Wt 157.0 lb

## 2016-08-14 DIAGNOSIS — M5136 Other intervertebral disc degeneration, lumbar region: Secondary | ICD-10-CM | POA: Diagnosis not present

## 2016-08-14 DIAGNOSIS — M5440 Lumbago with sciatica, unspecified side: Secondary | ICD-10-CM

## 2016-08-14 DIAGNOSIS — M545 Low back pain, unspecified: Secondary | ICD-10-CM

## 2016-08-14 MED ORDER — CYCLOBENZAPRINE HCL 10 MG PO TABS: 10.0000 mg | ORAL_TABLET | Freq: Every evening | ORAL | 0 refills | Status: DC | PRN

## 2016-08-14 MED ORDER — PREDNISONE 10 MG PO TABS: ORAL_TABLET | ORAL | 0 refills | Status: DC

## 2016-08-14 MED ORDER — METHYLPREDNISOLONE ACETATE 80 MG/ML IJ SUSP
80.0000 mg | Freq: Once | INTRAMUSCULAR | Status: AC
  Administered 2016-08-14: 80 mg via INTRAMUSCULAR

## 2016-08-14 NOTE — Progress Notes (Signed)
Patient ID: Ashley Salinas, female   DOB: 1955/03/16, 61 y.o.   MRN: [id number]    Subjective:  I acted as a Education administrator for Dr. Carollee Herter.  Guerry Bruin, [place]   Patient ID: Ashley Salinas, female    DOB: 11/19/1955, 61 y.o.   MRN: [id number]  Chief Complaint  Patient presents with  . Leg Pain    Leg Pain   Incident onset: a couple weeks ago. There was no injury mechanism. The pain is present in the right leg (calf and shin). The quality of the pain is described as burning and shooting. The pain has been constant since onset. Associated symptoms include numbness and tingling. Pertinent negatives include no inability to bear weight, loss of motion, loss of sensation or muscle weakness. Associated symptoms comments: Numbness in shin and right foot . The symptoms are aggravated by movement and weight bearing. She has tried acetaminophen for the symptoms. The treatment provided no relief.   she has pain in R buttock that goes down to the foot -- numbness has gotten worse over the last few weeks.  Pt with hx 3 back surgerys-- Dr Saintclair Halsted Patient is in today for right leg pain.  Patient Care Team: Carollee Herter, Alferd Apa, DO as PCP - General Jalene Mullet, MD as Consulting Physician (Ophthalmology) Remer Macho, MD as Attending Physician (Ophthalmology) Tamela Oddi, DDS as Consulting Physician (Oral Surgery)   Past Medical History:  Diagnosis Date  . Depression     Past Surgical History:  Procedure Laterality Date  . APPENDECTOMY  1990  . BREAST ENHANCEMENT SURGERY    . LUMBAR FUSION  03/21/2007  . SMALL INTESTINE SURGERY  1990  . 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 TOTAL HIP ARTHROPLASTY     Left- Dr.Moore  . TUBAL LIGATION  2004    Family History  Problem Relation Age of Onset  . Breast cancer Mother   . Cancer Mother 43       breast  . Arthritis Father   . Cancer Father        multiple myeloma  stage 3B    Social History   Social  History  . Marital status: Widowed    Spouse name: N/A  . Number of children: N/A  . Years of education: N/A   Occupational History  .      unemployed-- looking for another job   Social History Main Topics  . Smoking status: Current Some Day Smoker    Packs/day: 0.50    Years: 35.00    Types: Cigarettes    Last attempt to quit: 10/04/2010  . Smokeless tobacco: Never Used     Comment: smoking on and off-- few cig here and there  . Alcohol use 0.6 oz/week    1 Glasses of wine per week     Comment: rare-- actually < 1x   . Drug use: No  . Sexual activity: Yes    Partners: Male   Other Topics Concern  . Not on file   Social History Narrative   Exercise-  no    Outpatient Medications Prior to Visit  Medication Sig Dispense Refill  . amoxicillin (AMOXIL) 500 MG capsule 4 po 1 hour prior to dental work 10 capsule 1  . Multiple Vitamin (MULTIVITAMIN) capsule Take 1 capsule by mouth daily.    . varenicline (CHANTIX STARTING MONTH PAK) 0.5 MG X 11 & 1 MG X 42 tablet Take one 0.5 mg  tablet by mouth once daily for 3 days, then increase to one 0.5 mg tablet twice daily for 4 days, then increase to one 1 mg tablet twice daily. (Patient not taking: Reported on 03/26/2016) 53 tablet 0   No facility-administered medications prior to visit.     Allergies  Allergen Reactions  . Azithromycin Nausea Only  . Oxycodone Nausea Only  . Naproxen Rash    Review of Systems  Constitutional: Negative for fever and malaise/fatigue.  HENT: Negative for congestion.   Eyes: Negative for blurred vision.  Respiratory: Negative for cough and shortness of breath.   Cardiovascular: Negative for chest pain, palpitations and leg swelling.  Gastrointestinal: Negative for vomiting.  Musculoskeletal: Negative for back pain.  Skin: Negative for rash.  Neurological: Positive for tingling and numbness. Negative for loss of consciousness and headaches.       Objective:    Physical Exam  Constitutional:  She is oriented to person, place, and time. She appears well-developed and well-nourished.  HENT:  Head: Normocephalic and atraumatic.  Eyes: Conjunctivae and EOM are normal.  Neck: Normal range of motion. Neck supple. No JVD present. Carotid bruit is not present. No thyromegaly present.  Cardiovascular: Normal rate, regular rhythm and normal heart sounds.   No murmur heard. Pulmonary/Chest: Effort normal and breath sounds normal. No respiratory distress. She has no wheezes. She has no rales. She exhibits no tenderness.  Musculoskeletal: She exhibits no edema.  Neurological: She is alert and oriented to person, place, and time. She displays normal reflexes. Coordination normal.  Numbness and tingling in R buttock down leg to foot Good strength low ext  Psychiatric: She has a normal mood and affect.  Nursing note and vitals reviewed.   BP 122/76 (BP Location: Left Arm, Cuff Size: Normal)   Pulse 81   Temp 98.2 F (36.8 C) (Oral)   Resp 16   Ht '5\' 7"'  (1.702 m)   Wt 157 lb (71.2 kg)   SpO2 97%   BMI 24.59 kg/m  Wt Readings from Last 3 Encounters:  08/14/16 157 lb (71.2 kg)  03/26/16 170 lb 9.6 oz (77.4 kg)  02/15/15 164 lb 12.8 oz (74.8 kg)   BP Readings from Last 3 Encounters:  08/14/16 122/76  03/26/16 118/68  02/15/15 110/75     Immunization History  Administered Date(s) Administered  . H1N1 01/20/2008  . Influenza Whole 12/25/2006, 12/17/2007, 01/20/2009  . Influenza,inj,Quad PF,36+ Mos 02/11/2014  . Influenza-Unspecified 01/04/2015, 12/05/2015  . Td 12/25/2006    Health Maintenance  Topic Date Due  . HIV Screening  03/26/2017 (Originally 12/[birth year]/1971)  . INFLUENZA VACCINE  10/03/2016  . TETANUS/TDAP  12/24/2016  . MAMMOGRAM  01/08/2017  . PAP SMEAR  02/14/2018  . COLONOSCOPY  03/05/2021  . Hepatitis C Screening  Completed    Lab Results  Component Value Date   WBC 8.0 02/11/2014   HGB 14.3 02/11/2014   HCT 43.6 02/11/2014   PLT 264.0 02/11/2014   GLUCOSE  100 (H) 06/26/2016   CHOL 188 06/26/2016   TRIG 211.0 (H) 06/26/2016   HDL 66.40 06/26/2016   LDLDIRECT 97.0 06/26/2016   LDLCALC 98 02/15/2015   ALT 14 06/26/2016   AST 21 06/26/2016   NA 139 06/26/2016   K 3.6 06/26/2016   CL 106 06/26/2016   CREATININE 0.83 06/26/2016   BUN 16 06/26/2016   CO2 27 06/26/2016   TSH 1.49 02/11/2014    Lab Results  Component Value Date   TSH 1.49 02/11/2014  Lab Results  Component Value Date   WBC 8.0 02/11/2014   HGB 14.3 02/11/2014   HCT 43.6 02/11/2014   MCV 98.5 02/11/2014   PLT 264.0 02/11/2014   Lab Results  Component Value Date   NA 139 06/26/2016   K 3.6 06/26/2016   CO2 27 06/26/2016   GLUCOSE 100 (H) 06/26/2016   BUN 16 06/26/2016   CREATININE 0.83 06/26/2016   BILITOT 0.4 06/26/2016   ALKPHOS 47 06/26/2016   AST 21 06/26/2016   ALT 14 06/26/2016   PROT 7.0 06/26/2016   ALBUMIN 4.4 06/26/2016   CALCIUM 10.0 06/26/2016   GFR 74.65 06/26/2016   Lab Results  Component Value Date   CHOL 188 06/26/2016   Lab Results  Component Value Date   HDL 66.40 06/26/2016   Lab Results  Component Value Date   LDLCALC 98 02/15/2015   Lab Results  Component Value Date   TRIG 211.0 (H) 06/26/2016   Lab Results  Component Value Date   CHOLHDL 3 06/26/2016   No results found for: HGBA1C       Assessment & Plan:   Problem List Items Addressed This Visit    None    Visit Diagnoses    Low back pain with radiation    -  Primary   Relevant Medications   predniSONE (DELTASONE) 10 MG tablet   cyclobenzaprine (FLEXERIL) 10 MG tablet   methylPREDNISolone acetate (DEPO-MEDROL) injection 80 mg (Completed)   Other Relevant Orders   DG Lumbar Spine 2-3 Views (Completed)      I have discontinued Ms. Frein varenicline. I am also having her start on predniSONE and cyclobenzaprine. Additionally, I am having her maintain her multivitamin and amoxicillin. We administered methylPREDNISolone acetate.  Meds ordered this  encounter  Medications  . predniSONE (DELTASONE) 10 MG tablet    Sig: TAKE 3 TABLETS PO QD FOR 3 DAYS THEN TAKE 2 TABLETS PO QD FOR 3 DAYS THEN TAKE 1 TABLET PO QD FOR 3 DAYS THEN TAKE 1/2 TAB PO QD FOR 3 DAYS    Dispense:  20 tablet    Refill:  0  . cyclobenzaprine (FLEXERIL) 10 MG tablet    Sig: Take 1 tablet (10 mg total) by mouth at bedtime as needed for muscle spasms.    Dispense:  30 tablet    Refill:  0  . methylPREDNISolone acetate (DEPO-MEDROL) injection 80 mg    CMA served as scribe during this visit. History, Physical and Plan performed by medical provider. Documentation and orders reviewed and attested to.  [name], DO

## 2016-08-14 NOTE — Patient Instructions (Signed)

## 2016-09-03 ENCOUNTER — Ambulatory Visit: Payer: Medicare Other | Admitting: *Deleted

## 2016-10-11 NOTE — Progress Notes (Addendum)
Subjective:   Ashley Salinas is a 61 y.o. female who presents for Medicare Annual (Subsequent) preventive examination.  Pt is very pleasant. 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   Review of Systems:  No ROS.  Medicare Wellness Visit. Additional risk factors are reflected in the social history.  Cardiac Risk Factors include: advanced age (>17mn, >>52women);sedentary lifestyle;smoking/ tobacco exposure Sleep patterns:  Sleeps 7 hrs. Feels rested. Home Safety/Smoke Alarms: Feels safe in home. Smoke alarms in place.      Counseling:   Eye Exam- wears reading glasses. Dr.Digby yearly Dental- Every 3 months  Female:   Pap-  Last 02/15/15:    NEGATIVE FOR INTRAEPITHELIAL LESIONS OR MALIGNANCY. Mammo- Last 01/09/16:  Pt reported normal  Dexa scan-  Last 10/13/12: normal . ORDERED TODAY.     CCS- Last reported PER PT 05/18/16-normal     Objective:     Vitals: BP 128/72 (BP Location: Right Arm, Patient Position: Sitting, Cuff Size: Normal)   Pulse 69   Ht '5\' 7"'  (1.702 m)   Wt 158 lb 9.6 oz (71.9 kg)   SpO2 98%   BMI 24.84 kg/m   Body mass index is 24.84 kg/m.   Tobacco History  Smoking Status  . Current Some Day Smoker  . Packs/day: 0.50  . Years: 35.00  . Types: Cigarettes  . Last attempt to quit: 10/04/2010  Smokeless Tobacco  . Never Used    Comment: smoking on and off-- few cig here and there     Ready to quit: No Counseling given: Yes   Past Medical History:  Diagnosis Date  . Depression    Past Surgical History:  Procedure Laterality Date  . APPENDECTOMY  1990  . BREAST ENHANCEMENT SURGERY    . LUMBAR FUSION  03/21/2007  . SMALL INTESTINE SURGERY  1990  . 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 TOTAL HIP ARTHROPLASTY     Left- Dr.Moore  . TUBAL LIGATION  2004   Family History  Problem Relation Age of Onset  . Breast cancer Mother   . Cancer Mother 53      breast  . Arthritis Father   . Cancer  Father        multiple myeloma  stage 3B   History  Sexual Activity  . Sexual activity: Yes  . Partners: Male    Outpatient Encounter Prescriptions as of 10/24/2016  Medication Sig  . Multiple Vitamin (MULTIVITAMIN) capsule Take 1 capsule by mouth daily.  .Marland Kitchenamoxicillin (AMOXIL) 500 MG capsule 4 po 1 hour prior to dental work (Patient not taking: Reported on 10/24/2016)  . [DISCONTINUED] cyclobenzaprine (FLEXERIL) 10 MG tablet Take 1 tablet (10 mg total) by mouth at bedtime as needed for muscle spasms.  . [DISCONTINUED] predniSONE (DELTASONE) 10 MG tablet TAKE 3 TABLETS PO QD FOR 3 DAYS THEN TAKE 2 TABLETS PO QD FOR 3 DAYS THEN TAKE 1 TABLET PO QD FOR 3 DAYS THEN TAKE 1/2 TAB PO QD FOR 3 DAYS   No facility-administered encounter medications on file as of 10/24/2016.     Activities of Daily Living In your present state of health, do you have any difficulty performing the following activities: 10/24/2016 03/26/2016  Hearing? N N  Vision? N N  Difficulty concentrating or making decisions? N N  Walking or climbing stairs? N N  Dressing or bathing? N N  Doing errands, shopping? N [employer]  and eating ? N -  Using the Toilet? N -  In the past six months, have you accidently leaked urine? N -  Do you have problems with loss of bowel control? N -  Managing your Medications? N -  Managing your Finances? N -  Housekeeping or managing your Housekeeping? N -  Some recent data might be hidden    Patient Care Team: Carollee Herter, Alferd Apa, DO as PCP - General Jalene Mullet, MD as Consulting Physician (Ophthalmology) Remer Macho, MD as Attending Physician (Ophthalmology) Tamela Oddi, DDS as Consulting Physician (Oral Surgery)    Assessment:    Physical assessment deferred to PCP.  Exercise Activities and Dietary recommendations Current Exercise Habits: The patient does not participate in regular exercise at present, Exercise limited by: None identified Diet (meal  preparation, eat out, water intake, caffeinated beverages, dairy products, fruits and vegetables): in general, a "healthy" diet     Goals      Patient Stated   . Decrease smoking (pt-stated)      Fall Risk Fall Risk  10/24/2016 02/15/2015  Falls in the past year? No No   Depression Screen PHQ 2/9 Scores 10/24/2016 03/26/2016 02/15/2015 10/30/2012  PHQ - 2 Score 0 0 0 0     Cognitive Function Ad8 score reviewed for issues:  Issues making decisions:no  Less interest in hobbies / activities:no  Repeats questions, stories (family complaining):no  Trouble using ordinary gadgets (microwave, computer, phone):no  Forgets the month or year: no  Mismanaging finances: no  Remembering appts:no  Daily problems with thinking and/or memory:no Ad8 score is=0        Immunization History  Administered Date(s) Administered  . H1N1 01/20/2008  . Influenza Whole 12/25/2006, 12/17/2007, 01/20/2009  . Influenza,inj,Quad PF,6+ Mos 02/11/2014  . Influenza-Unspecified 01/04/2015, 12/05/2015  . Td 12/25/2006   Screening Tests Health Maintenance  Topic Date Due  . INFLUENZA VACCINE  10/03/2016  . HIV Screening  03/26/2017 (Originally 12/09/1971)  . TETANUS/TDAP  12/24/2016  . MAMMOGRAM  01/08/2017  . PAP SMEAR  02/14/2018  . COLONOSCOPY  03/05/2021  . Hepatitis C Screening  Completed      Plan:   Follow up as directed.  Continue to eat heart healthy diet (full of fruits, vegetables, whole grains, lean protein, water--limit salt, fat, and sugar intake) and increase physical activity as tolerated.  Continue doing brain stimulating activities (puzzles, reading, adult coloring books, staying active) to keep memory sharp.   Bone density scan ordered. They will call you to schedule appointment.   I have personally reviewed and noted the following in the patient's chart:   . Medical and social history . Use of alcohol, tobacco or illicit drugs  . Current medications and  supplements . Functional ability and status . Nutritional status . Physical activity . Advanced directives . List of other physicians . Hospitalizations, surgeries, and ER visits in previous 12 months . Vitals . Screenings to include cognitive, depression, and falls . Referrals and appointments  In addition, I have reviewed and discussed with patient certain preventive protocols, quality metrics, and best practice recommendations. A written personalized care plan for preventive services as well as general preventive health recommendations were provided to patient.     Shela Nevin, [place]  10/24/2016   I have reviewed documentation by ms. Vevelyn Royals and agree with her note Denny Peon MD

## 2016-10-24 ENCOUNTER — Encounter: Payer: Self-pay | Admitting: *Deleted

## 2016-10-24 ENCOUNTER — Ambulatory Visit (INDEPENDENT_AMBULATORY_CARE_PROVIDER_SITE_OTHER): Payer: Medicare Other | Admitting: *Deleted

## 2016-10-24 VITALS — BP 128/72 | HR 69 | Ht 67.0 in | Wt 158.6 lb

## 2016-10-24 DIAGNOSIS — Z0000 Encounter for general adult medical examination without abnormal findings: Secondary | ICD-10-CM | POA: Diagnosis not present

## 2016-10-24 DIAGNOSIS — Z780 Asymptomatic menopausal state: Secondary | ICD-10-CM

## 2016-10-24 NOTE — Patient Instructions (Signed)
Ashley Salinas , Thank you for taking time to come for your Medicare Wellness Visit. I appreciate your ongoing commitment to your health goals. Please review the following plan we discussed and let me know if I can assist you in the future.   These are the goals we discussed: Goals      Patient Stated   . Decrease smoking (pt-stated)       This is a list of the screening recommended for you and due dates:  Health Maintenance  Topic Date Due  . Flu Shot  10/03/2016  . HIV Screening  03/26/2017*  . Tetanus Vaccine  12/24/2016  . Mammogram  01/08/2017  . Pap Smear  02/14/2018  . Colon Cancer Screening  03/05/2021  .  Hepatitis C: One time screening is recommended by Center for Disease Control  (CDC) for  adults born from [birth year] through 1964.   Completed  *Topic was postponed. The date shown is not the original due date.    Follow up as directed.  Continue to eat heart healthy diet (full of fruits, vegetables, whole grains, lean protein, water--limit salt, fat, and sugar intake) and increase physical activity as tolerated.  Continue doing brain stimulating activities (puzzles, reading, adult coloring books, staying active) to keep memory sharp.   Bone density scan ordered. They will call you to schedule appointment.   Health Maintenance for Postmenopausal Women Menopause is a normal process in which your reproductive ability comes to an end. This process happens gradually over a span of months to years, usually between the ages of 13 and 61. Menopause is complete when you have missed 12 consecutive menstrual periods. It is important to talk with your health care provider about some of the most common conditions that affect postmenopausal women, such as heart disease, cancer, and bone loss (osteoporosis). Adopting a healthy lifestyle and getting preventive care can help to promote your health and wellness. Those actions can also lower your chances of developing some of these common  conditions. What should I know about menopause? During menopause, you may experience a number of symptoms, such as:  Moderate-to-severe hot flashes.  Night sweats.  Decrease in sex drive.  Mood swings.  Headaches.  Tiredness.  Irritability.  Memory problems.  Insomnia.  Choosing to treat or not to treat menopausal changes is an individual decision that you make with your health care provider. What should I know about hormone replacement therapy and supplements? Hormone therapy products are effective for treating symptoms that are associated with menopause, such as hot flashes and night sweats. Hormone replacement carries certain risks, especially as you become older. If you are thinking about using estrogen or estrogen with progestin treatments, discuss the benefits and risks with your health care provider. What should I know about heart disease and stroke? Heart disease, heart attack, and stroke become more likely as you age. This may be due, in part, to the hormonal changes that your body experiences during menopause. These can affect how your body processes dietary fats, triglycerides, and cholesterol. Heart attack and stroke are both medical emergencies. There are many things that you can do to help prevent heart disease and stroke:  Have your blood pressure checked at least every 1-2 years. High blood pressure causes heart disease and increases the risk of stroke.  If you are 57-70 years old, ask your health care provider if you should take aspirin to prevent a heart attack or a stroke.  Do not use any tobacco products, including cigarettes,  chewing tobacco, or electronic cigarettes. If you need help quitting, ask your health care provider.  It is important to eat a healthy diet and maintain a healthy weight. ? Be sure to include plenty of vegetables, fruits, low-fat dairy products, and lean protein. ? Avoid eating foods that are high in solid fats, added sugars, or salt  (sodium).  Get regular exercise. This is one of the most important things that you can do for your health. ? Try to exercise for at least 150 minutes each week. The type of exercise that you do should increase your heart rate and make you sweat. This is known as moderate-intensity exercise. ? Try to do strengthening exercises at least twice each week. Do these in addition to the moderate-intensity exercise.  Know your numbers.Ask your health care provider to check your cholesterol and your blood glucose. Continue to have your blood tested as directed by your health care provider.  What should I know about cancer screening? There are several types of cancer. Take the following steps to reduce your risk and to catch any cancer development as early as possible. Breast Cancer  Practice breast self-awareness. ? This means understanding how your breasts normally appear and feel. ? It also means doing regular breast self-exams. Let your health care provider know about any changes, no matter how small.  If you are 34 or older, have a clinician do a breast exam (clinical breast exam or CBE) every year. Depending on your age, family history, and medical history, it may be recommended that you also have a yearly breast X-ray (mammogram).  If you have a family history of breast cancer, talk with your health care provider about genetic screening.  If you are at high risk for breast cancer, talk with your health care provider about having an MRI and a mammogram every year.  Breast cancer (BRCA) gene test is recommended for women who have family members with BRCA-related cancers. Results of the assessment will determine the need for genetic counseling and BRCA1 and for BRCA2 testing. BRCA-related cancers include these types: ? Breast. This occurs in males or females. ? Ovarian. ? Tubal. This may also be called fallopian tube cancer. ? Cancer of the abdominal or pelvic lining (peritoneal  cancer). ? Prostate. ? Pancreatic.  Cervical, Uterine, and Ovarian Cancer Your health care provider may recommend that you be screened regularly for cancer of the pelvic organs. These include your ovaries, uterus, and vagina. This screening involves a pelvic exam, which includes checking for microscopic changes to the surface of your cervix (Pap test).  For women ages 21-65, health care providers may recommend a pelvic exam and a Pap test every three years. For women ages 29-65, they may recommend the Pap test and pelvic exam, combined with testing for human papilloma virus (HPV), every five years. Some types of HPV increase your risk of cervical cancer. Testing for HPV may also be done on women of any age who have unclear Pap test results.  Other health care providers may not recommend any screening for nonpregnant women who are considered low risk for pelvic cancer and have no symptoms. Ask your health care provider if a screening pelvic exam is right for you.  If you have had past treatment for cervical cancer or a condition that could lead to cancer, you need Pap tests and screening for cancer for at least 20 years after your treatment. If Pap tests have been discontinued for you, your risk factors (such as having  a new sexual partner) need to be reassessed to determine if you should start having screenings again. Some women have medical problems that increase the chance of getting cervical cancer. In these cases, your health care provider may recommend that you have screening and Pap tests more often.  If you have a family history of uterine cancer or ovarian cancer, talk with your health care provider about genetic screening.  If you have vaginal bleeding after reaching menopause, tell your health care provider.  There are currently no reliable tests available to screen for ovarian cancer.  Lung Cancer Lung cancer screening is recommended for adults 37-17 years old who are at high risk for  lung cancer because of a history of smoking. A yearly low-dose CT scan of the lungs is recommended if you:  Currently smoke.  Have a history of at least 30 pack-years of smoking and you currently smoke or have quit within the past 15 years. A pack-year is smoking an average of one pack of cigarettes per day for one year.  Yearly screening should:  Continue until it has been 15 years since you quit.  Stop if you develop a health problem that would prevent you from having lung cancer treatment.  Colorectal Cancer  This type of cancer can be detected and can often be prevented.  Routine colorectal cancer screening usually begins at age 48 and continues through age 35.  If you have risk factors for colon cancer, your health care provider may recommend that you be screened at an earlier age.  If you have a family history of colorectal cancer, talk with your health care provider about genetic screening.  Your health care provider may also recommend using home test kits to check for hidden blood in your stool.  A small camera at the end of a tube can be used to examine your colon directly (sigmoidoscopy or colonoscopy). This is done to check for the earliest forms of colorectal cancer.  Direct examination of the colon should be repeated every 5-10 years until age 62. However, if early forms of precancerous polyps or small growths are found or if you have a family history or genetic risk for colorectal cancer, you may need to be screened more often.  Skin Cancer  Check your skin from head to toe regularly.  Monitor any moles. Be sure to tell your health care provider: ? About any new moles or changes in moles, especially if there is a change in a mole's shape or color. ? If you have a mole that is larger than the size of a pencil eraser.  If any of your family members has a history of skin cancer, especially at a young age, talk with your health care provider about genetic  screening.  Always use sunscreen. Apply sunscreen liberally and repeatedly throughout the day.  Whenever you are outside, protect yourself by wearing long sleeves, pants, a wide-brimmed hat, and sunglasses.  What should I know about osteoporosis? Osteoporosis is a condition in which bone destruction happens more quickly than new bone creation. After menopause, you may be at an increased risk for osteoporosis. To help prevent osteoporosis or the bone fractures that can happen because of osteoporosis, the following is recommended:  If you are 90-62 years old, get at least 1,000 mg of calcium and at least 600 mg of vitamin D per day.  If you are older than age 39 but younger than age 20, get at least 1,200 mg of calcium and at  least 600 mg of vitamin D per day.  If you are older than age 46, get at least 1,200 mg of calcium and at least 800 mg of vitamin D per day.  Smoking and excessive alcohol intake increase the risk of osteoporosis. Eat foods that are rich in calcium and vitamin D, and do weight-bearing exercises several times each week as directed by your health care provider. What should I know about how menopause affects my mental health? Depression may occur at any age, but it is more common as you become older. Common symptoms of depression include:  Low or sad mood.  Changes in sleep patterns.  Changes in appetite or eating patterns.  Feeling an overall lack of motivation or enjoyment of activities that you previously enjoyed.  Frequent crying spells.  Talk with your health care provider if you think that you are experiencing depression. What should I know about immunizations? It is important that you get and maintain your immunizations. These include:  Tetanus, diphtheria, and pertussis (Tdap) booster vaccine.  Influenza every year before the flu season begins.  Pneumonia vaccine.  Shingles vaccine.  Your health care provider may also recommend other  immunizations. This information is not intended to replace advice given to you by your health care provider. Make sure you discuss any questions you have with your health care provider. Document Released: 04/13/2005 Document Revised: 09/09/2015 Document Reviewed: 11/23/2014 Elsevier Interactive Patient Education  2018 [employer].

## 2016-10-27 DIAGNOSIS — S61452A Open bite of left hand, initial encounter: Secondary | ICD-10-CM | POA: Diagnosis not present

## 2016-10-27 DIAGNOSIS — Z23 Encounter for immunization: Secondary | ICD-10-CM | POA: Diagnosis not present

## 2016-10-27 DIAGNOSIS — L03114 Cellulitis of left upper limb: Secondary | ICD-10-CM | POA: Diagnosis not present

## 2016-11-28 DIAGNOSIS — H35413 Lattice degeneration of retina, bilateral: Secondary | ICD-10-CM | POA: Diagnosis not present

## 2016-11-28 DIAGNOSIS — H2513 Age-related nuclear cataract, bilateral: Secondary | ICD-10-CM | POA: Diagnosis not present

## 2016-11-28 DIAGNOSIS — H02055 Trichiasis without entropian left lower eyelid: Secondary | ICD-10-CM | POA: Diagnosis not present

## 2016-11-28 DIAGNOSIS — H04123 Dry eye syndrome of bilateral lacrimal glands: Secondary | ICD-10-CM | POA: Diagnosis not present

## 2016-11-28 DIAGNOSIS — H5203 Hypermetropia, bilateral: Secondary | ICD-10-CM | POA: Diagnosis not present

## 2016-12-05 ENCOUNTER — Encounter: Payer: Self-pay | Admitting: Family Medicine

## 2016-12-05 ENCOUNTER — Ambulatory Visit (HOSPITAL_BASED_OUTPATIENT_CLINIC_OR_DEPARTMENT_OTHER)
Admission: RE | Admit: 2016-12-05 | Discharge: 2016-12-05 | Disposition: A | Discharge status: Home / Self Care | Payer: Medicare Other | Source: Ambulatory Visit | Attending: Family Medicine | Admitting: Family Medicine

## 2016-12-05 DIAGNOSIS — Z780 Asymptomatic menopausal state: Secondary | ICD-10-CM | POA: Diagnosis not present

## 2016-12-05 DIAGNOSIS — M810 Age-related osteoporosis without current pathological fracture: Secondary | ICD-10-CM | POA: Diagnosis not present

## 2016-12-05 DIAGNOSIS — Z720 Tobacco use: Secondary | ICD-10-CM | POA: Insufficient documentation

## 2016-12-25 ENCOUNTER — Encounter: Payer: Self-pay | Admitting: Family Medicine

## 2017-01-01 ENCOUNTER — Other Ambulatory Visit (INDEPENDENT_AMBULATORY_CARE_PROVIDER_SITE_OTHER): Payer: Medicare Other

## 2017-01-01 DIAGNOSIS — E785 Hyperlipidemia, unspecified: Secondary | ICD-10-CM

## 2017-01-01 LAB — COMPREHENSIVE METABOLIC PANEL
ALBUMIN: 4.5 g/dL (ref 3.5–5.2)
ALT: 16 U/L (ref 0–35)
AST: 23 U/L (ref 0–37)
Alkaline Phosphatase: 50 U/L (ref 39–117)
BUN: 17 mg/dL (ref 6–23)
CHLORIDE: 107 meq/L (ref 96–112)
CO2: 23 mEq/L (ref 19–32)
CREATININE: 0.79 mg/dL (ref 0.40–1.20)
Calcium: 10.6 mg/dL — ABNORMAL HIGH (ref 8.4–10.5)
GFR: 78.88 mL/min (ref >60.00)
GLUCOSE: 93 mg/dL (ref 70–99)
POTASSIUM: 3.9 meq/L (ref 3.5–5.1)
SODIUM: 139 meq/L (ref 135–145)
Total Bilirubin: 0.5 mg/dL (ref 0.2–1.2)
Total Protein: 7.4 g/dL (ref 6.0–8.3)

## 2017-01-01 LAB — LIPID PANEL
CHOL/HDL RATIO: 3
Cholesterol: 201 mg/dL — ABNORMAL HIGH (ref 0–200)
HDL: 69.2 mg/dL (ref >39.00)
LDL Cholesterol: 98 mg/dL (ref 0–99)
NONHDL: 131.78
TRIGLYCERIDES: 167 mg/dL — AB (ref 0.0–149.0)
VLDL: 33.4 mg/dL (ref 0.0–40.0)

## 2017-01-08 DIAGNOSIS — T7840XS Allergy, unspecified, sequela: Secondary | ICD-10-CM | POA: Diagnosis not present

## 2017-01-29 DIAGNOSIS — Z1231 Encounter for screening mammogram for malignant neoplasm of breast: Secondary | ICD-10-CM | POA: Diagnosis not present

## 2017-01-31 ENCOUNTER — Encounter: Payer: Self-pay | Admitting: Family Medicine

## 2017-03-26 ENCOUNTER — Telehealth: Payer: Self-pay | Admitting: Family Medicine

## 2017-03-26 NOTE — Telephone Encounter (Signed)
Called pt left message appt time or day needs to be changed per pcp.

## 2017-04-02 ENCOUNTER — Encounter: Payer: Self-pay | Admitting: Family Medicine

## 2017-04-02 ENCOUNTER — Ambulatory Visit (INDEPENDENT_AMBULATORY_CARE_PROVIDER_SITE_OTHER): Payer: Medicare Other | Admitting: Family Medicine

## 2017-04-02 VITALS — BP 110/68 | HR 74 | Temp 98.0°F | Resp 16 | Ht 67.0 in | Wt 154.4 lb

## 2017-04-02 DIAGNOSIS — E785 Hyperlipidemia, unspecified: Secondary | ICD-10-CM | POA: Diagnosis not present

## 2017-04-02 DIAGNOSIS — Z0000 Encounter for general adult medical examination without abnormal findings: Secondary | ICD-10-CM

## 2017-04-02 LAB — COMPREHENSIVE METABOLIC PANEL
ALBUMIN: 4.5 g/dL (ref 3.5–5.2)
ALT: 19 U/L (ref 0–35)
AST: 23 U/L (ref 0–37)
Alkaline Phosphatase: 48 U/L (ref 39–117)
BILIRUBIN TOTAL: 0.4 mg/dL (ref 0.2–1.2)
BUN: 20 mg/dL (ref 6–23)
CHLORIDE: 109 meq/L (ref 96–112)
CO2: 25 mEq/L (ref 19–32)
CREATININE: 0.88 mg/dL (ref 0.40–1.20)
Calcium: 9.9 mg/dL (ref 8.4–10.5)
GFR: 69.59 mL/min (ref >60.00)
Glucose, Bld: 89 mg/dL (ref 70–99)
Potassium: 4.2 mEq/L (ref 3.5–5.1)
SODIUM: 141 meq/L (ref 135–145)
Total Protein: 7.5 g/dL (ref 6.0–8.3)

## 2017-04-02 LAB — CBC WITH DIFFERENTIAL/PLATELET
BASOS ABS: 0 10*3/uL (ref 0.0–0.1)
BASOS PCT: 0.5 % (ref 0.0–3.0)
EOS ABS: 0 10*3/uL (ref 0.0–0.7)
Eosinophils Relative: 0.5 % (ref 0.0–5.0)
HCT: 43.5 % (ref 36.0–46.0)
HEMOGLOBIN: 14.6 g/dL (ref 12.0–15.0)
LYMPHS PCT: 21.7 % (ref 12.0–46.0)
Lymphs Abs: 1.7 10*3/uL (ref 0.7–4.0)
MCHC: 33.6 g/dL (ref 30.0–36.0)
MCV: 99.7 fl (ref 78.0–100.0)
MONO ABS: 0.4 10*3/uL (ref 0.1–1.0)
Monocytes Relative: 5.6 % (ref 3.0–12.0)
Neutro Abs: 5.7 10*3/uL (ref 1.4–7.7)
Neutrophils Relative %: 71.7 % (ref 43.0–77.0)
Platelets: 268 10*3/uL (ref 150.0–400.0)
RBC: 4.36 Mil/uL (ref 3.87–5.11)
RDW: 13.6 % (ref 11.5–15.5)
WBC: 7.9 10*3/uL (ref 4.0–10.5)

## 2017-04-02 LAB — LIPID PANEL
CHOL/HDL RATIO: 3
Cholesterol: 215 mg/dL — ABNORMAL HIGH (ref 0–200)
HDL: 68.2 mg/dL (ref >39.00)
NONHDL: 147.19
Triglycerides: 243 mg/dL — ABNORMAL HIGH (ref 0.0–149.0)
VLDL: 48.6 mg/dL — AB (ref 0.0–40.0)

## 2017-04-02 LAB — LDL CHOLESTEROL, DIRECT: LDL DIRECT: 110 mg/dL

## 2017-04-02 NOTE — Progress Notes (Signed)
Subjective:     Ashley Salinas is a 62 y.o. female and is here for a comprehensive physical exam. The patient reports no problems.  Social History   Socioeconomic History  . Marital status: Widowed    Spouse name: Not on file  . Number of children: Not on file  . Years of education: Not on file  . Highest education level: Not on file  Social Needs  . Financial resource strain: Not on file  . Food insecurity - worry: Not on file  . Food insecurity - inability: Not on file  . Transportation needs - medical: Not on file  . Transportation needs - non-medical: Not on file  Occupational History    Comment: unemployed-- looking for another job  Tobacco Use  . Smoking status: Current Some Day Smoker    Packs/day: 0.50    Years: 35.00    Pack years: 17.50    Types: Cigarettes    Last attempt to quit: 10/04/2010    Years since quitting: 6.4  . Smokeless tobacco: Never Used  . Tobacco comment: smoking on and off-- few cig here and there  Substance and Sexual Activity  . Alcohol use: Yes    Alcohol/week: 0.6 oz    Types: 1 Glasses of wine per week    Comment: rare-- actually < 1x   . Drug use: No  . Sexual activity: Yes    Partners: Male  Other Topics Concern  . Not on file  Social History Narrative   Exercise-  no   Health Maintenance  Topic Date Due  . HIV Screening  04/03/2023 (Originally 12/09/1970)  . MAMMOGRAM  01/29/2018  . PAP SMEAR  02/14/2018  . COLONOSCOPY  03/05/2021  . TETANUS/TDAP  10/28/2026  . INFLUENZA VACCINE  Completed  . Hepatitis C Screening  Completed    The following portions of the patient's history were reviewed and updated as appropriate:  She  has a past medical history of Depression. She does not have any pertinent problems on file. 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 Her family history includes Arthritis in her father; Breast cancer in her mother; Cancer in her father; Cancer (age of onset: 88) in her mother. She  reports that she has been smoking cigarettes.  She has a 17.50 pack-year smoking history. she has never used smokeless tobacco. She reports that she drinks about 0.6 oz of alcohol per week. She reports that she does not use drugs. She has a current medication list which includes the following prescription(s): multivitamin. Current Outpatient Medications on File Prior to Visit  Medication Sig Dispense Refill  . Multiple Vitamin (MULTIVITAMIN) capsule Take 1 capsule by mouth daily.     No current facility-administered medications on file prior to visit.    She is allergic to azithromycin; oxycodone; and naproxen..  Review of Systems Review of Systems  Constitutional: Negative for activity change, appetite change and fatigue.  HENT: Negative for hearing loss, congestion, tinnitus and ear discharge.  dentist q60m Eyes: Negative for visual disturbance (see optho q1y -- vision corrected to 20/20 with glasses).  Respiratory: Negative for cough, chest tightness and shortness of breath.   Cardiovascular: Negative for chest pain, palpitations and leg swelling.  Gastrointestinal: Negative for abdominal pain, diarrhea, constipation and abdominal distention.  Genitourinary: Negative for urgency, frequency, decreased urine volume  and difficulty urinating.  Musculoskeletal: Negative for back pain, arthralgias and gait problem.  Skin: Negative for color change, pallor and rash.  Neurological: Negative for dizziness, light-headedness, numbness and headaches.  Hematological: Negative for adenopathy. Does not bruise/bleed easily.  Psychiatric/Behavioral: Negative for suicidal ideas, confusion, sleep disturbance, self-injury, dysphoric mood, decreased concentration and agitation.       Objective:    BP 110/68 (BP  Location: Left Arm, Cuff Size: Normal)   Pulse 74   Temp 98 F (36.7 C) (Oral)   Resp 16   Ht 5\' 7"  (1.702 m)   Wt 154 lb 6.4 oz (70 kg)   SpO2 95%   BMI 24.18 kg/m  General appearance: alert, cooperative, appears stated age and no distress Head: Normocephalic, without obvious abnormality, atraumatic Eyes: conjunctivae/corneas clear. PERRL, EOM's intact. Fundi benign. Ears: normal TM's and external ear canals both ears Nose: Nares normal. Septum midline. Mucosa normal. No drainage or sinus tenderness. Throat: lips, mucosa, and tongue normal; teeth and gums normal Neck: no adenopathy, no carotid bruit, no JVD, supple, symmetrical, trachea midline and thyroid not enlarged, symmetric, no tenderness/mass/nodules Back: symmetric, no curvature. ROM normal. No CVA tenderness. Lungs: clear to auscultation bilaterally Breasts: normal appearance, no masses or tenderness Heart: regular rate and rhythm, S1, S2 normal, no murmur, click, rub or gallop Abdomen: soft, non-tender; bowel sounds normal; no masses,  no organomegaly Pelvic: deferred Extremities: extremities normal, atraumatic, no cyanosis or edema Pulses: 2+ and symmetric Skin: Skin color, texture, turgor normal. No rashes or lesions Lymph nodes: Cervical, supraclavicular, and axillary nodes normal. Neurologic: Alert and oriented X 3, normal strength and tone. Normal symmetric reflexes. Normal coordination and gait    Assessment:    Healthy female exam.      Plan:    ghm utd Check labs See After Visit Summary for Counseling Recommendations   Pt to get shingrix #2  At pharmacy

## 2017-04-02 NOTE — Patient Instructions (Signed)
Preventive Care 40-64 Years, Female Preventive care refers to lifestyle choices and visits with your health care provider that can promote health and wellness. What does preventive care include?  A yearly physical exam. This is also called an annual well check.  Dental exams once or twice a year.  Routine eye exams. Ask your health care provider how often you should have your eyes checked.  Personal lifestyle choices, including: ? Daily care of your teeth and gums. ? Regular physical activity. ? Eating a healthy diet. ? Avoiding tobacco and drug use. ? Limiting alcohol use. ? Practicing safe sex. ? Taking low-dose aspirin daily starting at age 58. ? Taking vitamin and mineral supplements as recommended by your health care provider. What happens during an annual well check? The services and screenings done by your health care provider during your annual well check will depend on your age, overall health, lifestyle risk factors, and family history of disease. Counseling Your health care provider may ask you questions about your:  Alcohol use.  Tobacco use.  Drug use.  Emotional well-being.  Home and relationship well-being.  Sexual activity.  Eating habits.  Work and work Statistician.  Method of birth control.  Menstrual cycle.  Pregnancy history.  Screening You may have the following tests or measurements:  Height, weight, and BMI.  Blood pressure.  Lipid and cholesterol levels. These may be checked every 5 years, or more frequently if you are over 81 years old.  Skin check.  Lung cancer screening. You may have this screening every year starting at age 78 if you have a 30-pack-year history of smoking and currently smoke or have quit within the past 15 years.  Fecal occult blood test (FOBT) of the stool. You may have this test every year starting at age 65.  Flexible sigmoidoscopy or colonoscopy. You may have a sigmoidoscopy every 5 years or a colonoscopy  every 10 years starting at age 30.  Hepatitis C blood test.  Hepatitis B blood test.  Sexually transmitted disease (STD) testing.  Diabetes screening. This is done by checking your blood sugar (glucose) after you have not eaten for a while (fasting). You may have this done every 1-3 years.  Mammogram. This may be done every 1-2 years. Talk to your health care provider about when you should start having regular mammograms. This may depend on whether you have a family history of breast cancer.  BRCA-related cancer screening. This may be done if you have a family history of breast, ovarian, tubal, or peritoneal cancers.  Pelvic exam and Pap test. This may be done every 3 years starting at age 80. Starting at age 36, this may be done every 5 years if you have a Pap test in combination with an HPV test.  Bone density scan. This is done to screen for osteoporosis. You may have this scan if you are at high risk for osteoporosis.  Discuss your test results, treatment options, and if necessary, the need for more tests with your health care provider. Vaccines Your health care provider may recommend certain vaccines, such as:  Influenza vaccine. This is recommended every year.  Tetanus, diphtheria, and acellular pertussis (Tdap, Td) vaccine. You may need a Td booster every 10 years.  Varicella vaccine. You may need this if you have not been vaccinated.  Zoster vaccine. You may need this after age 5.  Measles, mumps, and rubella (MMR) vaccine. You may need at least one dose of MMR if you were born in  1957 or later. You may also need a second dose.  Pneumococcal 13-valent conjugate (PCV13) vaccine. You may need this if you have certain conditions and were not previously vaccinated.  Pneumococcal polysaccharide (PPSV23) vaccine. You may need one or two doses if you smoke cigarettes or if you have certain conditions.  Meningococcal vaccine. You may need this if you have certain  conditions.  Hepatitis A vaccine. You may need this if you have certain conditions or if you travel or work in places where you may be exposed to hepatitis A.  Hepatitis B vaccine. You may need this if you have certain conditions or if you travel or work in places where you may be exposed to hepatitis B.  Haemophilus influenzae type b (Hib) vaccine. You may need this if you have certain conditions.  Talk to your health care provider about which screenings and vaccines you need and how often you need them. This information is not intended to replace advice given to you by your health care provider. Make sure you discuss any questions you have with your health care provider. Document Released: 03/18/2015 Document Revised: 11/09/2015 Document Reviewed: 12/21/2014 Elsevier Interactive Patient Education  2018 [employer]  

## 2017-04-05 ENCOUNTER — Encounter: Payer: Self-pay | Admitting: Family Medicine

## 2017-04-08 NOTE — Telephone Encounter (Signed)
Viewed by Lelon Huh on 04/08/2017 9:43 AM  Written by Carollee Herter, Alferd Apa, DO on 04/07/2017 6:16 PM  Cholesterol--- LDL goal < 100, HDL >40, TG < 150. Diet and exercise will increase HDL and decrease LDL and TG. Fish, Fish Oil, Flaxseed oil will also help increase the HDL and decrease Triglycerides.  Recheck labs in 3 months  Lipid, cmp.

## 2017-04-09 ENCOUNTER — Other Ambulatory Visit: Payer: Self-pay | Admitting: *Deleted

## 2017-04-09 DIAGNOSIS — E785 Hyperlipidemia, unspecified: Secondary | ICD-10-CM

## 2017-10-25 ENCOUNTER — Ambulatory Visit: Payer: Medicare Other | Admitting: *Deleted

## 2017-11-27 DIAGNOSIS — H35413 Lattice degeneration of retina, bilateral: Secondary | ICD-10-CM | POA: Diagnosis not present

## 2017-11-27 DIAGNOSIS — H2513 Age-related nuclear cataract, bilateral: Secondary | ICD-10-CM | POA: Diagnosis not present

## 2017-11-27 DIAGNOSIS — H04123 Dry eye syndrome of bilateral lacrimal glands: Secondary | ICD-10-CM | POA: Diagnosis not present

## 2018-02-03 DIAGNOSIS — Z1231 Encounter for screening mammogram for malignant neoplasm of breast: Secondary | ICD-10-CM | POA: Diagnosis not present

## 2018-02-03 LAB — HM MAMMOGRAPHY

## 2018-02-07 ENCOUNTER — Telehealth: Payer: Self-pay | Admitting: *Deleted

## 2018-02-07 NOTE — Telephone Encounter (Signed)
Received Mammogram results from [facility]; forwarded to provider/SLS 12/06

## 2018-03-04 ENCOUNTER — Encounter: Payer: Self-pay | Admitting: *Deleted

## 2018-04-21 ENCOUNTER — Other Ambulatory Visit (HOSPITAL_COMMUNITY)
Admission: RE | Admit: 2018-04-21 | Discharge: 2018-04-21 | Disposition: A | Discharge status: Home / Self Care | Payer: Medicare Other | Source: Ambulatory Visit | Attending: Family Medicine | Admitting: Family Medicine

## 2018-04-21 ENCOUNTER — Encounter: Payer: Self-pay | Admitting: Family Medicine

## 2018-04-21 ENCOUNTER — Ambulatory Visit (INDEPENDENT_AMBULATORY_CARE_PROVIDER_SITE_OTHER): Payer: Medicare Other | Admitting: Family Medicine

## 2018-04-21 VITALS — BP 100/60 | HR 86 | Temp 98.0°F | Resp 16 | Ht 67.0 in | Wt 163.4 lb

## 2018-04-21 DIAGNOSIS — Z96649 Presence of unspecified artificial hip joint: Secondary | ICD-10-CM

## 2018-04-21 DIAGNOSIS — Z124 Encounter for screening for malignant neoplasm of cervix: Secondary | ICD-10-CM

## 2018-04-21 DIAGNOSIS — Z0000 Encounter for general adult medical examination without abnormal findings: Secondary | ICD-10-CM | POA: Diagnosis not present

## 2018-04-21 DIAGNOSIS — E785 Hyperlipidemia, unspecified: Secondary | ICD-10-CM | POA: Diagnosis not present

## 2018-04-21 MED ORDER — AMOXICILLIN 500 MG PO CAPS: ORAL_CAPSULE | ORAL | 0 refills | Status: DC

## 2018-04-21 NOTE — Patient Instructions (Signed)
Preventive Care 40-64 Years, Female Preventive care refers to lifestyle choices and visits with your health care provider that can promote health and wellness. What does preventive care include?   A yearly physical exam. This is also called an annual well check.  Dental exams once or twice a year.  Routine eye exams. Ask your health care provider how often you should have your eyes checked.  Personal lifestyle choices, including: ? Daily care of your teeth and gums. ? Regular physical activity. ? Eating a healthy diet. ? Avoiding tobacco and drug use. ? Limiting alcohol use. ? Practicing safe sex. ? Taking low-dose aspirin daily starting at age 50. ? Taking vitamin and mineral supplements as recommended by your health care provider. What happens during an annual well check? The services and screenings done by your health care provider during your annual well check will depend on your age, overall health, lifestyle risk factors, and family history of disease. Counseling Your health care provider may ask you questions about your:  Alcohol use.  Tobacco use.  Drug use.  Emotional well-being.  Home and relationship well-being.  Sexual activity.  Eating habits.  Work and work environment.  Method of birth control.  Menstrual cycle.  Pregnancy history. Screening You may have the following tests or measurements:  Height, weight, and BMI.  Blood pressure.  Lipid and cholesterol levels. These may be checked every 5 years, or more frequently if you are over 50 years old.  Skin check.  Lung cancer screening. You may have this screening every year starting at age 55 if you have a 30-pack-year history of smoking and currently smoke or have quit within the past 15 years.  Colorectal cancer screening. All adults should have this screening starting at age 50 and continuing until age 75. Your health care provider may recommend screening at age 45. You will have tests every  1-10 years, depending on your results and the type of screening test. People at increased risk should start screening at an earlier age. Screening tests may include: ? Guaiac-based fecal occult blood testing. ? Fecal immunochemical test (FIT). ? Stool DNA test. ? Virtual colonoscopy. ? Sigmoidoscopy. During this test, a flexible tube with a tiny camera (sigmoidoscope) is used to examine your rectum and lower colon. The sigmoidoscope is inserted through your anus into your rectum and lower colon. ? Colonoscopy. During this test, a long, thin, flexible tube with a tiny camera (colonoscope) is used to examine your entire colon and rectum.  Hepatitis C blood test.  Hepatitis B blood test.  Sexually transmitted disease (STD) testing.  Diabetes screening. This is done by checking your blood sugar (glucose) after you have not eaten for a while (fasting). You may have this done every 1-3 years.  Mammogram. This may be done every 1-2 years. Talk to your health care provider about when you should start having regular mammograms. This may depend on whether you have a family history of breast cancer.  BRCA-related cancer screening. This may be done if you have a family history of breast, ovarian, tubal, or peritoneal cancers.  Pelvic exam and Pap test. This may be done every 3 years starting at age 21. Starting at age 30, this may be done every 5 years if you have a Pap test in combination with an HPV test.  Bone density scan. This is done to screen for osteoporosis. You may have this scan if you are at high risk for osteoporosis. Discuss your test results, treatment options,   and if necessary, the need for more tests with your health care provider. Vaccines Your health care provider may recommend certain vaccines, such as:  Influenza vaccine. This is recommended every year.  Tetanus, diphtheria, and acellular pertussis (Tdap, Td) vaccine. You may need a Td booster every 10 years.  Varicella  vaccine. You may need this if you have not been vaccinated.  Zoster vaccine. You may need this after age 38.  Measles, mumps, and rubella (MMR) vaccine. You may need at least one dose of MMR if you were born in 1957 or later. You may also need a second dose.  Pneumococcal 13-valent conjugate (PCV13) vaccine. You may need this if you have certain conditions and were not previously vaccinated.  Pneumococcal polysaccharide (PPSV23) vaccine. You may need one or two doses if you smoke cigarettes or if you have certain conditions.  Meningococcal vaccine. You may need this if you have certain conditions.  Hepatitis A vaccine. You may need this if you have certain conditions or if you travel or work in places where you may be exposed to hepatitis A.  Hepatitis B vaccine. You may need this if you have certain conditions or if you travel or work in places where you may be exposed to hepatitis B.  Haemophilus influenzae type b (Hib) vaccine. You may need this if you have certain conditions. Talk to your health care provider about which screenings and vaccines you need and how often you need them. This information is not intended to replace advice given to you by your health care provider. Make sure you discuss any questions you have with your health care provider. Document Released: 03/18/2015 Document Revised: 04/11/2017 Document Reviewed: 12/21/2014 Elsevier Interactive Patient Education  2019 [employer].

## 2018-04-21 NOTE — Progress Notes (Signed)
.  pap

## 2018-04-21 NOTE — Progress Notes (Signed)
Subjective:     Ashley Salinas is a 63 y.o. female and is here for a comprehensive physical exam. The patient reports no problems.  Social History   Socioeconomic History  . Marital status: Widowed    Spouse name: Not on file  . Number of children: Not on file  . Years of education: Not on file  . Highest education level: Not on file  Occupational History    Comment: unemployed-- looking for another job  Social Needs  . Financial resource strain: Not on file  . Food insecurity:    Worry: Not on file    Inability: Not on file  . Transportation needs:    Medical: Not on file    Non-medical: Not on file  Tobacco Use  . Smoking status: Current Some Day Smoker    Packs/day: 0.50    Years: 35.00    Pack years: 17.50    Types: Cigarettes    Last attempt to quit: 10/04/2010    Years since quitting: 7.5  . Smokeless tobacco: Never Used  . Tobacco comment: smoking on and off-- few cig here and there  Substance and Sexual Activity  . Alcohol use: Yes    Alcohol/week: 1.0 standard drinks    Types: 1 Glasses of wine per week    Comment: rare-- actually < 1x   . Drug use: No  . Sexual activity: Yes    Partners: Male  Lifestyle  . Physical activity:    Days per week: Not on file    Minutes per session: Not on file  . Stress: Not on file  Relationships  . Social connections:    Talks on phone: Not on file    Gets together: Not on file    Attends religious service: Not on file    Active member of club or organization: Not on file    Attends meetings of clubs or organizations: Not on file    Relationship status: Not on file  . Intimate partner violence:    Fear of current or ex partner: Not on file    Emotionally abused: Not on file    Physically abused: Not on file    Forced sexual activity: Not on file  Other Topics Concern  . Not on file  Social History Narrative   Exercise-  no   Health Maintenance  Topic Date Due  . PAP SMEAR-Modifier  02/14/2018  . HIV Screening   04/03/2023 (Originally 12/09/1970)  . MAMMOGRAM  02/04/2019  . COLONOSCOPY  03/05/2021  . TETANUS/TDAP  10/28/2026  . INFLUENZA VACCINE  Completed  . Hepatitis C Screening  Completed    The following portions of the patient's history were reviewed and updated as appropriate:  She  has a past medical history of Depression. She does not have any pertinent problems on file. 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 Her family history includes Arthritis in her father; Breast cancer in her mother; Cancer in her father; Cancer (age of onset: 58) in her mother. She  reports that she has been smoking cigarettes. She has a 17.50 pack-year smoking history. She has never used smokeless tobacco. She reports current alcohol use of about 1.0 standard drinks of alcohol per week. She reports that she does not use drugs. She has a current medication list which includes the following prescription(s):  amoxicillin and multivitamin. Current Outpatient Medications on File Prior to Visit  Medication Sig Dispense Refill  . Multiple Vitamin (MULTIVITAMIN) capsule Take 1 capsule by mouth daily.     No current facility-administered medications on file prior to visit.    She is allergic to azithromycin; oxycodone; and naproxen..  Review of Systems Review of Systems  Constitutional: Negative for activity change, appetite change and fatigue.  HENT: Negative for hearing loss, congestion, tinnitus and ear discharge.  dentist q31m Eyes: Negative for visual disturbance (see optho q1y -- vision corrected to 20/20 with glasses).  Respiratory: Negative for cough, chest tightness and shortness of breath.   Cardiovascular: Negative for chest pain, palpitations and leg swelling.  Gastrointestinal: Negative for abdominal  pain, diarrhea, constipation and abdominal distention.  Genitourinary: Negative for urgency, frequency, decreased urine volume and difficulty urinating.  Musculoskeletal: Negative for back pain, arthralgias and gait problem.  Skin: Negative for color change, pallor and rash.  Neurological: Negative for dizziness, light-headedness, numbness and headaches.  Hematological: Negative for adenopathy. Does not bruise/bleed easily.  Psychiatric/Behavioral: Negative for suicidal ideas, confusion, sleep disturbance, self-injury, dysphoric mood, decreased concentration and agitation.       Objective:    BP 100/60 (BP Location: Left Arm, Cuff Size: Normal)   Pulse 86   Temp 98 F (36.7 C) (Oral)   Resp 16   Ht 5\' 7"  (1.702 m)   Wt 163 lb 6.4 oz (74.1 kg)   SpO2 98%   BMI 25.59 kg/m  General appearance: alert, cooperative, appears stated age and no distress Head: Normocephalic, without obvious abnormality, atraumatic Eyes: conjunctivae/corneas clear. PERRL, EOM's intact. Fundi benign. Ears: normal TM's and external ear canals both ears Nose: Nares normal. Septum midline. Mucosa normal. No drainage or sinus tenderness. Throat: lips, mucosa, and tongue normal; teeth and gums normal Neck: no adenopathy, no carotid bruit, no JVD, supple, symmetrical, trachea midline and thyroid not enlarged, symmetric, no tenderness/mass/nodules Back: symmetric, no curvature. ROM normal. No CVA tenderness. Lungs: clear to auscultation bilaterally Breasts: normal appearance, no masses or tenderness Heart: regular rate and rhythm, S1, S2 normal, no murmur, click, rub or gallop Abdomen: soft, non-tender; bowel sounds normal; no masses,  no organomegaly Pelvic: cervix normal in appearance, external genitalia normal, no adnexal masses or tenderness, no cervical motion tenderness, rectovaginal septum normal, uterus normal size, shape, and consistency, vagina normal without discharge and pap done, rectal ==heme neg brown  stool Extremities: extremities normal, atraumatic, no cyanosis or edema Pulses: 2+ and symmetric Skin: Skin color, texture, turgor normal. No rashes or lesions Lymph nodes: Cervical, supraclavicular, and axillary nodes normal. Neurologic: Alert and oriented X 3, normal strength and tone. Normal symmetric reflexes. Normal coordination and gait    Assessment:    Healthy female exam.      Plan:    ghm utd Check labs  See After Visit Summary for Counseling Recommendations

## 2018-04-21 NOTE — Assessment & Plan Note (Signed)
Encouraged heart healthy diet, increase exercise, avoid trans fats, consider a krill oil cap daily 

## 2018-04-22 LAB — COMPREHENSIVE METABOLIC PANEL
ALT: 24 U/L (ref 0–35)
AST: 26 U/L (ref 0–37)
Albumin: 4.3 g/dL (ref 3.5–5.2)
Alkaline Phosphatase: 53 U/L (ref 39–117)
BUN: 20 mg/dL (ref 6–23)
CALCIUM: 10.1 mg/dL (ref 8.4–10.5)
CO2: 27 meq/L (ref 19–32)
Chloride: 104 mEq/L (ref 96–112)
Creatinine, Ser: 1.2 mg/dL (ref 0.40–1.20)
GFR: 45.62 mL/min — AB (ref >60.00)
GLUCOSE: 61 mg/dL — AB (ref 70–99)
Potassium: 4.5 mEq/L (ref 3.5–5.1)
Sodium: 142 mEq/L (ref 135–145)
TOTAL PROTEIN: 6.9 g/dL (ref 6.0–8.3)
Total Bilirubin: 0.4 mg/dL (ref 0.2–1.2)

## 2018-04-22 LAB — LDL CHOLESTEROL, DIRECT: Direct LDL: 104 mg/dL

## 2018-04-22 LAB — LIPID PANEL
CHOL/HDL RATIO: 3
CHOLESTEROL: 200 mg/dL (ref 0–200)
HDL: 69.4 mg/dL (ref >39.00)
NONHDL: 130.85
TRIGLYCERIDES: 212 mg/dL — AB (ref 0.0–149.0)
VLDL: 42.4 mg/dL — ABNORMAL HIGH (ref 0.0–40.0)

## 2018-04-23 LAB — CYTOLOGY - PAP
DIAGNOSIS: NEGATIVE
HPV (WINDOPATH): NOT DETECTED

## 2018-04-25 ENCOUNTER — Encounter: Payer: Self-pay | Admitting: Family Medicine

## 2018-04-26 ENCOUNTER — Encounter: Payer: Self-pay | Admitting: Family Medicine

## 2018-04-28 ENCOUNTER — Other Ambulatory Visit: Payer: Self-pay | Admitting: *Deleted

## 2018-04-28 DIAGNOSIS — E781 Pure hyperglyceridemia: Secondary | ICD-10-CM

## 2018-04-28 NOTE — Telephone Encounter (Signed)
If she is not symptomatic there is no reason to treat it ---- she can try monistat otc  If symptomatic we can call in diflucan

## 2018-05-15 ENCOUNTER — Encounter: Payer: Self-pay | Admitting: Family Medicine

## 2018-05-16 ENCOUNTER — Other Ambulatory Visit: Payer: Self-pay | Admitting: Family Medicine

## 2018-05-16 DIAGNOSIS — N760 Acute vaginitis: Secondary | ICD-10-CM

## 2018-05-16 MED ORDER — FLUCONAZOLE 150 MG PO TABS: ORAL_TABLET | ORAL | 0 refills | Status: DC

## 2018-05-16 NOTE — Telephone Encounter (Signed)
Sent in

## 2018-05-20 ENCOUNTER — Encounter: Payer: Self-pay | Admitting: Family Medicine

## 2018-05-20 ENCOUNTER — Ambulatory Visit (INDEPENDENT_AMBULATORY_CARE_PROVIDER_SITE_OTHER): Payer: Medicare Other | Admitting: Family Medicine

## 2018-05-20 ENCOUNTER — Other Ambulatory Visit: Payer: Self-pay

## 2018-05-20 VITALS — BP 138/80 | HR 91 | Temp 98.7°F | Resp 16 | Ht 67.0 in | Wt 162.8 lb

## 2018-05-20 DIAGNOSIS — R52 Pain, unspecified: Secondary | ICD-10-CM

## 2018-05-20 DIAGNOSIS — J029 Acute pharyngitis, unspecified: Secondary | ICD-10-CM | POA: Diagnosis not present

## 2018-05-20 DIAGNOSIS — R05 Cough: Secondary | ICD-10-CM

## 2018-05-20 DIAGNOSIS — R059 Cough, unspecified: Secondary | ICD-10-CM

## 2018-05-20 LAB — POC INFLUENZA A&B (BINAX/QUICKVUE)
INFLUENZA A, POC: NEGATIVE
Influenza B, POC: NEGATIVE

## 2018-05-20 MED ORDER — HYDROCODONE-HOMATROPINE 5-1.5 MG/5ML PO SYRP: 5.0000 mL | ORAL_SOLUTION | Freq: Three times a day (TID) | ORAL | 0 refills | Status: DC | PRN

## 2018-05-20 MED ORDER — AMOXICILLIN 875 MG PO TABS: 875.0000 mg | ORAL_TABLET | Freq: Two times a day (BID) | ORAL | 0 refills | Status: DC

## 2018-05-20 NOTE — Patient Instructions (Signed)

## 2018-05-20 NOTE — Progress Notes (Signed)
Patient ID: Ashley Salinas, female    DOB: 09/29/1955  Age: 63 y.o. MRN: [id number]    Subjective:  Subjective  HPI Ashley Salinas presents for body aches, runny nose sore throat and headache , no fevers x 1 week.   She takes 2 tylenol every morning.  + congested / cough, productive   Review of Systems  Constitutional: Negative for appetite change, diaphoresis, fatigue and unexpected weight change.  HENT: Positive for congestion, sneezing and sore throat. Negative for sinus pressure and sinus pain.   Eyes: Negative for pain, redness and visual disturbance.  Respiratory: Negative for cough, chest tightness, shortness of breath and wheezing.   Cardiovascular: Negative for chest pain, palpitations and leg swelling.  Endocrine: Negative for cold intolerance, heat intolerance, polydipsia, polyphagia and polyuria.  Genitourinary: Negative for difficulty urinating, dysuria and frequency.  Neurological: Negative for dizziness, light-headedness, numbness and headaches.    History Past Medical History:  Diagnosis Date  . 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   Her family history includes Arthritis in her father; Breast cancer in her mother; Cancer in her father; Cancer (age of onset: 66) in her mother.She reports that she has been smoking cigarettes. She has a 17.50 pack-year smoking history. She has never used smokeless tobacco. She reports current alcohol use of about 1.0 standard drinks of alcohol per week. She reports that she does not use drugs.  Current Outpatient Medications on File Prior to Visit  Medication Sig Dispense Refill  . amoxicillin (AMOXIL) 500 MG capsule 4 po x1 1 hour prior to procedure 30 capsule 0  . fluconazole (DIFLUCAN) 150 MG tablet 1 po x1, may  repeat in 3 days prn 2 tablet 0  . Multiple Vitamin (MULTIVITAMIN) capsule Take 1 capsule by mouth daily.     No current facility-administered medications on file prior to visit.      Objective:  Objective  Physical Exam Vitals signs and nursing note reviewed.  Constitutional:      Appearance: She is well-developed.  HENT:     Head: Normocephalic and atraumatic.     Right Ear: Tympanic membrane, ear canal and external ear normal.     Left Ear: Tympanic membrane, ear canal and external ear normal.     Mouth/Throat:     Mouth: Mucous membranes are moist.     Pharynx: Posterior oropharyngeal erythema present.  Eyes:     General:        Right eye: No discharge.        Left eye: No discharge.     Conjunctiva/sclera: Conjunctivae normal.  Neck:     Musculoskeletal: Normal range of motion and neck supple.     Thyroid: No thyromegaly.     Vascular: No carotid bruit or JVD.  Cardiovascular:     Rate and Rhythm: Normal rate and regular rhythm.     Heart sounds: Normal heart sounds. No murmur.  Pulmonary:     Effort: Pulmonary effort is normal. No respiratory distress.     Breath sounds: Normal breath sounds. No wheezing or rales.  Chest:     Chest wall: No tenderness.  Lymphadenopathy:     Cervical: Cervical adenopathy present.  Neurological:     Mental Status: She is alert and oriented to person, place, and time.    BP  138/80 (BP Location: Right Arm, Cuff Size: Normal)   Pulse 91   Temp 98.7 F (37.1 C) (Oral)   Resp 16   Ht 5\' 7"  (1.702 m)   Wt 162 lb 12.8 oz (73.8 kg)   SpO2 98%   BMI 25.50 kg/m  Wt Readings from Last 3 Encounters:  05/20/18 162 lb 12.8 oz (73.8 kg)  04/21/18 163 lb 6.4 oz (74.1 kg)  04/02/17 154 lb 6.4 oz (70 kg)     Lab Results  Component Value Date   WBC 7.9 04/02/2017   HGB 14.6 04/02/2017   HCT 43.5 04/02/2017   PLT 268.0 04/02/2017   GLUCOSE 61 (L) 04/21/2018   CHOL 200 04/21/2018   TRIG 212.0 (H) 04/21/2018   HDL 69.40 04/21/2018    LDLDIRECT 104.0 04/21/2018   LDLCALC 98 01/01/2017   ALT 24 04/21/2018   AST 26 04/21/2018   NA 142 04/21/2018   K 4.5 04/21/2018   CL 104 04/21/2018   CREATININE 1.20 04/21/2018   BUN 20 04/21/2018   CO2 27 04/21/2018   TSH 1.49 02/11/2014    No results found.   Assessment & Plan:  Plan  I am having Ashley Salinas start on amoxicillin and HYDROcodone-homatropine. I am also having her maintain her multivitamin, amoxicillin, and fluconazole.  Meds ordered this encounter  Medications  . amoxicillin (AMOXIL) 875 MG tablet    Sig: Take 1 tablet (875 mg total) by mouth 2 (two) times daily.    Dispense:  20 tablet    Refill:  0  . HYDROcodone-homatropine (HYCODAN) 5-1.5 MG/5ML syrup    Sig: Take 5 mLs by mouth every 8 (eight) hours as needed for cough.    Dispense:  120 mL    Refill:  0    Problem List Items Addressed This Visit    None    Visit Diagnoses    Generalized body aches    -  Primary   Relevant Orders   POC Influenza A&B (Binax test) (Completed)   Pharyngitis, unspecified etiology       Relevant Medications   amoxicillin (AMOXIL) 875 MG tablet   Other Relevant Orders   Culture, Group A Strep   Cough       Relevant Medications   HYDROcodone-homatropine (HYCODAN) 5-1.5 MG/5ML syrup      Follow-up: Return if symptoms worsen or fail to improve.  [name], DO

## 2018-05-22 LAB — CULTURE, GROUP A STREP
MICRO NUMBER: 328331
SPECIMEN QUALITY: ADEQUATE

## 2018-05-23 ENCOUNTER — Encounter: Payer: Self-pay | Admitting: Family Medicine

## 2018-05-26 ENCOUNTER — Encounter: Payer: Self-pay | Admitting: Family Medicine

## 2018-07-22 ENCOUNTER — Other Ambulatory Visit: Payer: Self-pay

## 2018-07-22 ENCOUNTER — Other Ambulatory Visit (INDEPENDENT_AMBULATORY_CARE_PROVIDER_SITE_OTHER): Payer: Medicare Other

## 2018-07-22 DIAGNOSIS — E781 Pure hyperglyceridemia: Secondary | ICD-10-CM

## 2018-07-22 LAB — COMPREHENSIVE METABOLIC PANEL
ALT: 14 U/L (ref 0–35)
AST: 19 U/L (ref 0–37)
Albumin: 4.3 g/dL (ref 3.5–5.2)
Alkaline Phosphatase: 54 U/L (ref 39–117)
BUN: 17 mg/dL (ref 6–23)
CO2: 22 mEq/L (ref 19–32)
Calcium: 9.5 mg/dL (ref 8.4–10.5)
Chloride: 106 mEq/L (ref 96–112)
Creatinine, Ser: 0.8 mg/dL (ref 0.40–1.20)
GFR: 72.77 mL/min (ref >60.00)
Glucose, Bld: 89 mg/dL (ref 70–99)
Potassium: 3.9 mEq/L (ref 3.5–5.1)
Sodium: 138 mEq/L (ref 135–145)
Total Bilirubin: 0.5 mg/dL (ref 0.2–1.2)
Total Protein: 7 g/dL (ref 6.0–8.3)

## 2018-07-22 LAB — LIPID PANEL
Cholesterol: 213 mg/dL — ABNORMAL HIGH (ref 0–200)
HDL: 75.2 mg/dL (ref >39.00)
LDL Cholesterol: 100 mg/dL — ABNORMAL HIGH (ref 0–99)
NonHDL: 137.56
Total CHOL/HDL Ratio: 3
Triglycerides: 189 mg/dL — ABNORMAL HIGH (ref 0.0–149.0)
VLDL: 37.8 mg/dL (ref 0.0–40.0)

## 2018-12-16 ENCOUNTER — Encounter: Payer: Self-pay | Admitting: Acute Care

## 2019-02-18 DIAGNOSIS — Z1231 Encounter for screening mammogram for malignant neoplasm of breast: Secondary | ICD-10-CM | POA: Diagnosis not present

## 2019-02-18 LAB — HM MAMMOGRAPHY

## 2019-02-23 ENCOUNTER — Telehealth: Payer: Self-pay | Admitting: Family Medicine

## 2019-02-23 NOTE — Telephone Encounter (Signed)
Patient declined AWV 

## 2019-04-02 DIAGNOSIS — H04123 Dry eye syndrome of bilateral lacrimal glands: Secondary | ICD-10-CM | POA: Diagnosis not present

## 2019-04-02 DIAGNOSIS — H2513 Age-related nuclear cataract, bilateral: Secondary | ICD-10-CM | POA: Diagnosis not present

## 2019-04-02 DIAGNOSIS — H35413 Lattice degeneration of retina, bilateral: Secondary | ICD-10-CM | POA: Diagnosis not present

## 2019-05-08 ENCOUNTER — Other Ambulatory Visit: Payer: Self-pay

## 2019-05-08 NOTE — Progress Notes (Signed)
Nurse connected with patient 05/11/19 at  3:00 PM EST by a telephone enabled telemedicine application and verified that I am speaking with the correct person using two identifiers. Patient stated full name and DOB. Patient gave permission to continue with virtual visit. Patient's location was at home and Nurse's location was at [place] office.   Subjective:   Ashley Salinas is a 64 y.o. female who presents for Medicare Annual (Subsequent) preventive examination.  Review of Systems:  Cardiac Risk Factors include: advanced age (>59mn, >>59women);dyslipidemia Home Safety/Smoke Alarms: Feels safe in home. Smoke alarms in place.  Lives w/ someone.  Female:   Pap- 04/21/18      Mammo-02/18/19       Dexa scan-  12/05/16      CCS-per pt last 05/18/16 -normal    Objective:     Vitals: Unable to assess. This visit is enabled though telemedicine due to Covid 19.   Advanced Directives 05/11/2019 10/24/2016 02/15/2015  Does Patient Have a Medical Advance Directive? No Yes Yes  Type of Advance Directive - HBruinLiving will HHatchLiving will  Does patient want to make changes to medical advance directive? - - No - Patient declined  Copy of HArchboldin Chart? - No - copy requested No - copy requested  Would patient like information on creating a medical advance directive? No - Patient declined - -    Tobacco Social History   Tobacco Use  Smoking Status Current Some Day Smoker  . Packs/day: 0.50  . Years: 35.00  . Pack years: 17.50  . Types: Cigarettes  . Last attempt to quit: 10/04/2010  . Years since quitting: 8.6  Smokeless Tobacco Never Used  Tobacco Comment   smoking on and off-- few cig here and there     Ready to quit: Not Answered Counseling given: Not Answered Comment: smoking on and off-- few cig here and there   Clinical Intake: Pain : No/denies pain     Past Medical History:  Diagnosis Date  .  Depression    Past Surgical History:  Procedure Laterality Date  . APPENDECTOMY  1990  . BREAST ENHANCEMENT SURGERY    . LUMBAR FUSION  03/21/2007  . SMALL INTESTINE SURGERY  1990  . 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 TOTAL HIP ARTHROPLASTY     Left- Dr.Moore  . TUBAL LIGATION  2004   Family History  Problem Relation Age of Onset  . Breast cancer Mother   . Cancer Mother 557      breast  . Arthritis Father   . Cancer Father        multiple myeloma  stage 3B   Social History   Socioeconomic History  . Marital status: Widowed    Spouse name: Not on file  . Number of children: Not on file  . Years of education: Not on file  . Highest education level: Not on file  Occupational History    Comment: unemployed-- looking for another job  Tobacco Use  . Smoking status: Current Some Day Smoker    Packs/day: 0.50    Years: 35.00    Pack years: 17.50    Types: Cigarettes    Last attempt to quit: 10/04/2010    Years since quitting: 8.6  . Smokeless tobacco: Never Used  . Tobacco comment: smoking on and off-- few cig here and there  Substance and Sexual Activity  .  Alcohol use: Yes    Alcohol/week: 1.0 standard drinks    Types: 1 Glasses of wine per week    Comment: rare-- actually < 1x   . Drug use: No  . Sexual activity: Yes    Partners: Male  Other Topics Concern  . Not on file  Social History Narrative   Exercise-  no   Social Determinants of Health   Financial Resource Strain:   . Difficulty of Paying Living Expenses: Not on file  Food Insecurity:   . Worried About Charity fundraiser in the Last Year: Not on file  . Ran Out of Food in the Last Year: Not on file  Transportation Needs:   . Lack of Transportation (Medical): Not on file  . Lack of Transportation (Non-Medical): Not on file  Physical Activity:   . Days of Exercise per Week: Not on file  . Minutes of Exercise per Session: Not on file  Stress:   . Feeling of  Stress : Not on file  Social Connections:   . Frequency of Communication with Friends and Family: Not on file  . Frequency of Social Gatherings with Friends and Family: Not on file  . Attends Religious Services: Not on file  . Active Member of Clubs or Organizations: Not on file  . Attends Archivist Meetings: Not on file  . Marital Status: Not on file    Outpatient Encounter Medications as of 05/11/2019  Medication Sig  . Multiple Vitamin (MULTIVITAMIN) capsule Take 1 capsule by mouth daily.  . [DISCONTINUED] amoxicillin (AMOXIL) 500 MG capsule 4 po x1 1 hour prior to procedure  . [DISCONTINUED] amoxicillin (AMOXIL) 875 MG tablet Take 1 tablet (875 mg total) by mouth 2 (two) times daily.  . [DISCONTINUED] fluconazole (DIFLUCAN) 150 MG tablet 1 po x1, may repeat in 3 days prn  . [DISCONTINUED] HYDROcodone-homatropine (HYCODAN) 5-1.5 MG/5ML syrup Take 5 mLs by mouth every 8 (eight) hours as needed for cough. (Patient not taking: Reported on 05/11/2019)   No facility-administered encounter medications on file as of 05/11/2019.    Activities of Daily Living In your present state of health, do you have any difficulty performing the following activities: 05/11/2019 05/11/2019  Hearing? N N  Vision? N N  Difficulty concentrating or making decisions? N N  Walking or climbing stairs? N N  Dressing or bathing? N N  Doing errands, shopping? N N  Preparing Food and eating ? N -  Using the Toilet? N -  In the past six months, have you accidently leaked urine? N -  Do you have problems with loss of bowel control? N -  Managing your Medications? N -  Managing your Finances? N -  Housekeeping or managing your Housekeeping? N -  Some recent data might be hidden    Patient Care Team: Carollee Herter, Alferd Apa, DO as PCP - General Jalene Mullet, MD as Consulting Physician (Ophthalmology) Remer Macho, MD as Attending Physician (Ophthalmology) Tamela Oddi, DDS as Consulting  Physician (Oral Surgery)    Assessment:   This is a routine wellness examination for Ashley Salinas. Physical assessment deferred to PCP.  Exercise Activities and Dietary recommendations Current Exercise Habits: Home exercise routine, Type of exercise: walking, Time (Minutes): 10, Frequency (Times/Week): 3, Weekly Exercise (Minutes/Week): 30, Intensity: Mild, Exercise limited by: None identified Diet (meal preparation, eat out, water intake, caffeinated beverages, dairy products, fruits and vegetables): well balanced   Goals    . Decrease smoking (pt-stated)  Fall Risk Fall Risk  05/11/2019 04/02/2017 10/24/2016 02/15/2015  Falls in the past year? 0 No No No  Number falls in past yr: 0 - - -  Injury with Fall? 0 - - -  Follow up Falls prevention discussed;Education provided - - -   Depression Screen PHQ 2/9 Scores 05/11/2019 04/22/2018 04/02/2017 10/24/2016  PHQ - 2 Score 0 0 0 0  PHQ- 9 Score - 0 - -     Cognitive Function Ad8 score reviewed for issues:  Issues making decisions:no  Less interest in hobbies / activities:no  Repeats questions, stories (family complaining):no  Trouble using ordinary gadgets (microwave, computer, phone):no  Forgets the month or year: no  Mismanaging finances: no  Remembering appts:no  Daily problems with thinking and/or memory:no Ad8 score is=0        Immunization History  Administered Date(s) Administered  . H1N1 01/20/2008  . Influenza Whole 12/25/2006, 12/17/2007, 01/20/2009  . Influenza,inj,Quad PF,6+ Mos 02/11/2014  . Influenza-Unspecified 01/04/2015, 12/05/2015, 12/12/2016, 12/03/2017  . Td 12/25/2006, 10/27/2016  . Zoster Recombinat (Shingrix) 12/12/2016, 05/15/2017   Screening Tests Health Maintenance  Topic Date Due  . HIV Screening  04/03/2023 (Originally 12/09/1970)  . MAMMOGRAM  02/18/2020  . COLONOSCOPY  03/05/2021  . PAP SMEAR-Modifier  04/21/2021  . TETANUS/TDAP  10/28/2026  . INFLUENZA VACCINE  Completed  . Hepatitis  C Screening  Completed       Plan:    Please schedule your next medicare wellness visit with me in 1 yr.  Continue to eat heart healthy diet (full of fruits, vegetables, whole grains, lean protein, water--limit salt, fat, and sugar intake) and increase physical activity as tolerated.  Continue doing brain stimulating activities (puzzles, reading, adult coloring books, staying active) to keep memory sharp.     I have personally reviewed and noted the following in the patient's chart:   . Medical and social history . Use of alcohol, tobacco or illicit drugs  . Current medications and supplements . Functional ability and status . Nutritional status . Physical activity . Advanced directives . List of other physicians . Hospitalizations, surgeries, and ER visits in previous 12 months . Vitals . Screenings to include cognitive, depression, and falls . Referrals and appointments  In addition, I have reviewed and discussed with patient certain preventive protocols, quality metrics, and best practice recommendations. A written personalized care plan for preventive services as well as general preventive health recommendations were provided to patient.     Shela Nevin, [place]  05/11/2019

## 2019-05-11 ENCOUNTER — Ambulatory Visit (INDEPENDENT_AMBULATORY_CARE_PROVIDER_SITE_OTHER): Payer: Medicare Other | Admitting: *Deleted

## 2019-05-11 ENCOUNTER — Encounter: Payer: Self-pay | Admitting: Family Medicine

## 2019-05-11 ENCOUNTER — Other Ambulatory Visit: Payer: Self-pay

## 2019-05-11 ENCOUNTER — Ambulatory Visit (INDEPENDENT_AMBULATORY_CARE_PROVIDER_SITE_OTHER): Payer: Medicare Other | Admitting: Family Medicine

## 2019-05-11 ENCOUNTER — Encounter: Payer: Self-pay | Admitting: *Deleted

## 2019-05-11 VITALS — BP 120/80 | HR 84 | Temp 97.9°F | Resp 18 | Ht 67.0 in | Wt 174.8 lb

## 2019-05-11 DIAGNOSIS — Z0000 Encounter for general adult medical examination without abnormal findings: Secondary | ICD-10-CM

## 2019-05-11 LAB — LIPID PANEL
Cholesterol: 200 mg/dL (ref 0–200)
HDL: 70.1 mg/dL (ref >39.00)
LDL Cholesterol: 104 mg/dL — ABNORMAL HIGH (ref 0–99)
NonHDL: 130.25
Total CHOL/HDL Ratio: 3
Triglycerides: 129 mg/dL (ref 0.0–149.0)
VLDL: 25.8 mg/dL (ref 0.0–40.0)

## 2019-05-11 LAB — COMPREHENSIVE METABOLIC PANEL
ALT: 19 U/L (ref 0–35)
AST: 23 U/L (ref 0–37)
Albumin: 4.4 g/dL (ref 3.5–5.2)
Alkaline Phosphatase: 57 U/L (ref 39–117)
BUN: 23 mg/dL (ref 6–23)
CO2: 24 mEq/L (ref 19–32)
Calcium: 10 mg/dL (ref 8.4–10.5)
Chloride: 105 mEq/L (ref 96–112)
Creatinine, Ser: 0.91 mg/dL (ref 0.40–1.20)
GFR: 62.55 mL/min (ref >60.00)
Glucose, Bld: 82 mg/dL (ref 70–99)
Potassium: 4.2 mEq/L (ref 3.5–5.1)
Sodium: 138 mEq/L (ref 135–145)
Total Bilirubin: 0.4 mg/dL (ref 0.2–1.2)
Total Protein: 7.2 g/dL (ref 6.0–8.3)

## 2019-05-11 LAB — CBC WITH DIFFERENTIAL/PLATELET
Basophils Absolute: 0 10*3/uL (ref 0.0–0.1)
Basophils Relative: 0.4 % (ref 0.0–3.0)
Eosinophils Absolute: 0.1 10*3/uL (ref 0.0–0.7)
Eosinophils Relative: 1 % (ref 0.0–5.0)
HCT: 41.1 % (ref 36.0–46.0)
Hemoglobin: 14.1 g/dL (ref 12.0–15.0)
Lymphocytes Relative: 36 % (ref 12.0–46.0)
Lymphs Abs: 2.2 10*3/uL (ref 0.7–4.0)
MCHC: 34.4 g/dL (ref 30.0–36.0)
MCV: 98.7 fl (ref 78.0–100.0)
Monocytes Absolute: 0.3 10*3/uL (ref 0.1–1.0)
Monocytes Relative: 5.5 % (ref 3.0–12.0)
Neutro Abs: 3.6 10*3/uL (ref 1.4–7.7)
Neutrophils Relative %: 57.1 % (ref 43.0–77.0)
Platelets: 225 10*3/uL (ref 150.0–400.0)
RBC: 4.17 Mil/uL (ref 3.87–5.11)
RDW: 13.6 % (ref 11.5–15.5)
WBC: 6.2 10*3/uL (ref 4.0–10.5)

## 2019-05-11 LAB — TSH: TSH: 1.49 u[IU]/mL (ref 0.35–4.50)

## 2019-05-11 NOTE — Patient Instructions (Signed)
Please schedule your next medicare wellness visit with me in 1 yr.  Continue to eat heart healthy diet (full of fruits, vegetables, whole grains, lean protein, water--limit salt, fat, and sugar intake) and increase physical activity as tolerated.  Continue doing brain stimulating activities (puzzles, reading, adult coloring books, staying active) to keep memory sharp.    Ashley Salinas , Thank you for taking time to come for your Medicare Wellness Visit. I appreciate your ongoing commitment to your health goals. Please review the following plan we discussed and let me know if I can assist you in the future.   These are the goals we discussed: Goals    . Decrease smoking (pt-stated)       This is a list of the screening recommended for you and due dates:  Health Maintenance  Topic Date Due  . HIV Screening  04/03/2023*  . Mammogram  02/18/2020  . Colon Cancer Screening  03/05/2021  . Pap Smear  04/21/2021  . Tetanus Vaccine  10/28/2026  . Flu Shot  Completed  .  Hepatitis C: One time screening is recommended by Center for Disease Control  (CDC) for  adults born from 1945 through 1965.   Completed  *Topic was postponed. The date shown is not the original due date.    Coping with Quitting Smoking  Quitting smoking is a physical and mental challenge. You will face cravings, withdrawal symptoms, and temptation. Before quitting, work with your health care provider to make a plan that can help you cope. Preparation can help you quit and keep you from giving in. How can I cope with cravings? Cravings usually last for 5-10 minutes. If you get through it, the craving will pass. Consider taking the following actions to help you cope with cravings:  Keep your mouth busy: ? Chew sugar-free gum. ? Suck on hard candies or a straw. ? Brush your teeth.  Keep your hands and body busy: ? Immediately change to a different activity when you feel a craving. ? Squeeze or play with a ball. ? Do an  activity or a hobby, like making bead jewelry, practicing needlepoint, or working with wood. ? Mix up your normal routine. ? Take a short exercise break. Go for a quick walk or run up and down stairs. ? Spend time in public places where smoking is not allowed.  Focus on doing something kind or helpful for someone else.  Call a friend or family member to talk during a craving.  Join a support group.  Call a quit line, such as 1-800-QUIT-NOW.  Talk with your health care provider about medicines that might help you cope with cravings and make quitting easier for you. How can I deal with withdrawal symptoms? Your body may experience negative effects as it tries to get used to not having nicotine in the system. These effects are called withdrawal symptoms. They may include:  Feeling hungrier than normal.  Trouble concentrating.  Irritability.  Trouble sleeping.  Feeling depressed.  Restlessness and agitation.  Craving a cigarette. To manage withdrawal symptoms:  Avoid places, people, and activities that trigger your cravings.  Remember why you want to quit.  Get plenty of sleep.  Avoid coffee and other caffeinated drinks. These may worsen some of your symptoms. How can I handle social situations? Social situations can be difficult when you are quitting smoking, especially in the first few weeks. To manage this, you can:  Avoid parties, bars, and other social situations where people might be   smoking.  Avoid alcohol.  Leave right away if you have the urge to smoke.  Explain to your family and friends that you are quitting smoking. Ask for understanding and support.  Plan activities with friends or family where smoking is not an option. What are some ways I can cope with stress? Wanting to smoke may cause stress, and stress can make you want to smoke. Find ways to manage your stress. Relaxation techniques can help. For example:  Breathe slowly and deeply, in through your  nose and out through your mouth.  Listen to soothing, relaxing music.  Talk with a family member or friend about your stress.  Light a candle.  Soak in a bath or take a shower.  Think about a peaceful place. What are some ways I can prevent weight gain? Be aware that many people gain weight after they quit smoking. However, not everyone does. To keep from gaining weight, have a plan in place before you quit and stick to the plan after you quit. Your plan should include:  Having healthy snacks. When you have a craving, it may help to: ? Eat plain popcorn, crunchy carrots, celery, or other cut vegetables. ? Chew sugar-free gum.  Changing how you eat: ? Eat small portion sizes at meals. ? Eat 4-6 small meals throughout the day instead of 1-2 large meals a day. ? Be mindful when you eat. Do not watch television or do other things that might distract you as you eat.  Exercising regularly: ? Make time to exercise each day. If you do not have time for a long workout, do short bouts of exercise for 5-10 minutes several times a day. ? Do some form of strengthening exercise, like weight lifting, and some form of aerobic exercise, like running or swimming.  Drinking plenty of water or other low-calorie or no-calorie drinks. Drink 6-8 glasses of water daily, or as much as instructed by your health care provider. Summary  Quitting smoking is a physical and mental challenge. You will face cravings, withdrawal symptoms, and temptation to smoke again. Preparation can help you as you go through these challenges.  You can cope with cravings by keeping your mouth busy (such as by chewing gum), keeping your body and hands busy, and making calls to family, friends, or a helpline for people who want to quit smoking.  You can cope with withdrawal symptoms by avoiding places where people smoke, avoiding drinks with caffeine, and getting plenty of rest.  Ask your health care provider about the different  ways to prevent weight gain, avoid stress, and handle social situations. This information is not intended to replace advice given to you by your health care provider. Make sure you discuss any questions you have with your health care provider. Document Revised: 02/01/2017 Document Reviewed: 02/17/2016 Elsevier Patient Education  2020 Else[employer] Inc[employer]  Preventive Care 69-15 Years Old, Female Preventive care refers to visits with your health care provider and lifestyle choices that can promote health and wellness. This includes:  A yearly physical exam. This may also be called an annual well check.  Regular dental visits and eye exams.  Immunizations.  Screening for certain conditions.  Healthy lifestyle choices, such as eating a healthy diet, getting regular exercise, not using drugs or products that contain nicotine and tobacco, and limiting alcohol use. What can I expect for my preventive care visit? Physical exam Your health care provider will check your:  Height and weight. This may be used to calculate  body mass index (BMI), which tells if you are at a healthy weight.  Heart rate and blood pressure.  Skin for abnormal spots. Counseling Your health care provider may ask you questions about your:  Alcohol, tobacco, and drug use.  Emotional well-being.  Home and relationship well-being.  Sexual activity.  Eating habits.  Work and work environment.  Method of birth control.  Menstrual cycle.  Pregnancy history. What immunizations do I need?  Influenza (flu) vaccine  This is recommended every year. Tetanus, diphtheria, and pertussis (Tdap) vaccine  You may need a Td booster every 10 years. Varicella (chickenpox) vaccine  You may need this if you have not been vaccinated. Zoster (shingles) vaccine  You may need this after age 60. Measles, mumps, and rubella (MMR) vaccine  You may need at least one dose of MMR if you were born in 1957 or later. You may also  need a second dose. Pneumococcal conjugate (PCV13) vaccine  You may need this if you have certain conditions and were not previously vaccinated. Pneumococcal polysaccharide (PPSV23) vaccine  You may need one or two doses if you smoke cigarettes or if you have certain conditions. Meningococcal conjugate (MenACWY) vaccine  You may need this if you have certain conditions. Hepatitis A vaccine  You may need this if you have certain conditions or if you travel or work in places where you may be exposed to hepatitis A. Hepatitis B vaccine  You may need this if you have certain conditions or if you travel or work in places where you may be exposed to hepatitis B. Haemophilus influenzae type b (Hib) vaccine  You may need this if you have certain conditions. Human papillomavirus (HPV) vaccine  If recommended by your health care provider, you may need three doses over 6 months. You may receive vaccines as individual doses or as more than one vaccine together in one shot (combination vaccines). Talk with your health care provider about the risks and benefits of combination vaccines. What tests do I need? Blood tests  Lipid and cholesterol levels. These may be checked every 5 years, or more frequently if you are over 50 years old.  Hepatitis C test.  Hepatitis B test. Screening  Lung cancer screening. You may have this screening every year starting at age 55 if you have a 30-pack-year history of smoking and currently smoke or have quit within the past 15 years.  Colorectal cancer screening. All adults should have this screening starting at age 50 and continuing until age 75. Your health care provider may recommend screening at age 45 if you are at increased risk. You will have tests every 1-10 years, depending on your results and the type of screening test.  Diabetes screening. This is done by checking your blood sugar (glucose) after you have not eaten for a while (fasting). You may have  this done every 1-3 years.  Mammogram. This may be done every 1-2 years. Talk with your health care provider about when you should start having regular mammograms. This may depend on whether you have a family history of breast cancer.  BRCA-related cancer screening. This may be done if you have a family history of breast, ovarian, tubal, or peritoneal cancers.  Pelvic exam and Pap test. This may be done every 3 years starting at age 21. Starting at age 30, this may be done every 5 years if you have a Pap test in combination with an HPV test. Other tests  Sexually transmitted disease (STD) testing.    Bone density scan. This is done to screen for osteoporosis. You may have this scan if you are at high risk for osteoporosis. Follow these instructions at home: Eating and drinking  Eat a diet that includes fresh fruits and vegetables, whole grains, lean protein, and low-fat dairy.  Take vitamin and mineral supplements as recommended by your health care provider.  Do not drink alcohol if: ? Your health care provider tells you not to drink. ? You are pregnant, may be pregnant, or are planning to become pregnant.  If you drink alcohol: ? Limit how much you have to 0-1 drink a day. ? Be aware of how much alcohol is in your drink. In the [place] one drink equals one 12 oz bottle of beer (355 mL), one 5 oz glass of wine (148 mL), or one 1 oz glass of hard liquor (44 mL). Lifestyle  Take daily care of your teeth and gums.  Stay active. Exercise for at least 30 minutes on 5 or more days each week.  Do not use any products that contain nicotine or tobacco, such as cigarettes, e-cigarettes, and chewing tobacco. If you need help quitting, ask your health care provider.  If you are sexually active, practice safe sex. Use a condom or other form of birth control (contraception) in order to prevent pregnancy and STIs (sexually transmitted infections).  If told by your health care provider, take  low-dose aspirin daily starting at age 50. What's next?  Visit your health care provider once a year for a well check visit.  Ask your health care provider how often you should have your eyes and teeth checked.  Stay up to date on all vaccines. This information is not intended to replace advice given to you by your health care provider. Make sure you discuss any questions you have with your health care provider. Document Revised: 10/31/2017 Document Reviewed: 10/31/2017 Elsevier Patient Education  2020 Else[employer] Inc.  

## 2019-05-11 NOTE — Patient Instructions (Signed)
Preventive Care 5-63 Years Old, Female Preventive care refers to visits with your health care provider and lifestyle choices that can promote health and wellness. This includes:  A yearly physical exam. This may also be called an annual well check.  Regular dental visits and eye exams.  Immunizations.  Screening for certain conditions.  Healthy lifestyle choices, such as eating a healthy diet, getting regular exercise, not using drugs or products that contain nicotine and tobacco, and limiting alcohol use. What can I expect for my preventive care visit? Physical exam Your health care provider will check your:  Height and weight. This may be used to calculate body mass index (BMI), which tells if you are at a healthy weight.  Heart rate and blood pressure.  Skin for abnormal spots. Counseling Your health care provider may ask you questions about your:  Alcohol, tobacco, and drug use.  Emotional well-being.  Home and relationship well-being.  Sexual activity.  Eating habits.  Work and work Statistician.  Method of birth control.  Menstrual cycle.  Pregnancy history. What immunizations do I need?  Influenza (flu) vaccine  This is recommended every year. Tetanus, diphtheria, and pertussis (Tdap) vaccine  You may need a Td booster every 10 years. Varicella (chickenpox) vaccine  You may need this if you have not been vaccinated. Zoster (shingles) vaccine  You may need this after age 63. Measles, mumps, and rubella (MMR) vaccine  You may need at least one dose of MMR if you were born in 1957 or later. You may also need a second dose. Pneumococcal conjugate (PCV13) vaccine  You may need this if you have certain conditions and were not previously vaccinated. Pneumococcal polysaccharide (PPSV23) vaccine  You may need one or two doses if you smoke cigarettes or if you have certain conditions. Meningococcal conjugate (MenACWY) vaccine  You may need this if you  have certain conditions. Hepatitis A vaccine  You may need this if you have certain conditions or if you travel or work in places where you may be exposed to hepatitis A. Hepatitis B vaccine  You may need this if you have certain conditions or if you travel or work in places where you may be exposed to hepatitis B. Haemophilus influenzae type b (Hib) vaccine  You may need this if you have certain conditions. Human papillomavirus (HPV) vaccine  If recommended by your health care provider, you may need three doses over 6 months. You may receive vaccines as individual doses or as more than one vaccine together in one shot (combination vaccines). Talk with your health care provider about the risks and benefits of combination vaccines. What tests do I need? Blood tests  Lipid and cholesterol levels. These may be checked every 5 years, or more frequently if you are over 63 years old.  Hepatitis C test.  Hepatitis B test. Screening  Lung cancer screening. You may have this screening every year starting at age 63 if you have a 30-pack-year history of smoking and currently smoke or have quit within the past 15 years.  Colorectal cancer screening. All adults should have this screening starting at age 63 and continuing until age 43. Your health care provider may recommend screening at age 2 if you are at increased risk. You will have tests every 1-10 years, depending on your results and the type of screening test.  Diabetes screening. This is done by checking your blood sugar (glucose) after you have not eaten for a while (fasting). You may have this  done every 1-3 years.  Mammogram. This may be done every 1-2 years. Talk with your health care provider about when you should start having regular mammograms. This may depend on whether you have a family history of breast cancer.  BRCA-related cancer screening. This may be done if you have a family history of breast, ovarian, tubal, or peritoneal  cancers.  Pelvic exam and Pap test. This may be done every 3 years starting at age 63. Starting at age 63, this may be done every 5 years if you have a Pap test in combination with an HPV test. Other tests  Sexually transmitted disease (STD) testing.  Bone density scan. This is done to screen for osteoporosis. You may have this scan if you are at high risk for osteoporosis. Follow these instructions at home: Eating and drinking  Eat a diet that includes fresh fruits and vegetables, whole grains, lean protein, and low-fat dairy.  Take vitamin and mineral supplements as recommended by your health care provider.  Do not drink alcohol if: ? Your health care provider tells you not to drink. ? You are pregnant, may be pregnant, or are planning to become pregnant.  If you drink alcohol: ? Limit how much you have to 0-1 drink a day. ? Be aware of how much alcohol is in your drink. In the [place] one drink equals one 12 oz bottle of beer (355 mL), one 5 oz glass of wine (148 mL), or one 1 oz glass of hard liquor (44 mL). Lifestyle  Take daily care of your teeth and gums.  Stay active. Exercise for at least 30 minutes on 5 or more days each week.  Do not use any products that contain nicotine or tobacco, such as cigarettes, e-cigarettes, and chewing tobacco. If you need help quitting, ask your health care provider.  If you are sexually active, practice safe sex. Use a condom or other form of birth control (contraception) in order to prevent pregnancy and STIs (sexually transmitted infections).  If told by your health care provider, take low-dose aspirin daily starting at age 63. What's next?  Visit your health care provider once a year for a well check visit.  Ask your health care provider how often you should have your eyes and teeth checked.  Stay up to date on all vaccines. This information is not intended to replace advice given to you by your health care provider. Make sure you  discuss any questions you have with your health care provider. Document Revised: 10/31/2017 Document Reviewed: 10/31/2017 Elsevier Patient Education  2020 [employer].

## 2019-05-11 NOTE — Progress Notes (Signed)
Subjective:     Ashley Salinas is a 64 y.o. female and is here for a comprehensive physical exam. The patient reports no problems.  Social History   Socioeconomic History  . Marital status: Widowed    Spouse name: Not on file  . Number of children: Not on file  . Years of education: Not on file  . Highest education level: Not on file  Occupational History    Comment: unemployed-- looking for another job  Tobacco Use  . Smoking status: Current Some Day Smoker    Packs/day: 0.50    Years: 35.00    Pack years: 17.50    Types: Cigarettes    Last attempt to quit: 10/04/2010    Years since quitting: 8.6  . Smokeless tobacco: Never Used  . Tobacco comment: smoking on and off-- few cig here and there  Substance and Sexual Activity  . Alcohol use: Yes    Alcohol/week: 1.0 standard drinks    Types: 1 Glasses of wine per week    Comment: rare-- actually < 1x   . Drug use: No  . Sexual activity: Yes    Partners: Male  Other Topics Concern  . Not on file  Social History Narrative   Exercise-  no   Social Determinants of Health   Financial Resource Strain: Low Risk   . Difficulty of Paying Living Expenses: Not hard at all  Food Insecurity:   . Worried About Charity fundraiser in the Last Year: Not on file  . Ran Out of Food in the Last Year: Not on file  Transportation Needs: No Transportation Needs  . Lack of Transportation (Medical): No  . Lack of Transportation (Non-Medical): No  Physical Activity:   . Days of Exercise per Week: Not on file  . Minutes of Exercise per Session: Not on file  Stress:   . Feeling of Stress : Not on file  Social Connections:   . Frequency of Communication with Friends and Family: Not on file  . Frequency of Social Gatherings with Friends and Family: Not on file  . Attends Religious Services: Not on file  . Active Member of Clubs or Organizations: Not on file  . Attends Archivist Meetings: Not on file  . Marital Status: Not on file   Intimate Partner Violence:   . Fear of Current or Ex-Partner: Not on file  . Emotionally Abused: Not on file  . Physically Abused: Not on file  . Sexually Abused: Not on file   Health Maintenance  Topic Date Due  . HIV Screening  04/03/2023 (Originally 12/09/1970)  . MAMMOGRAM  02/18/2020  . COLONOSCOPY  03/05/2021  . PAP SMEAR-Modifier  04/21/2021  . TETANUS/TDAP  10/28/2026  . INFLUENZA VACCINE  Completed  . Hepatitis C Screening  Completed     The following portions of the patient's history were reviewed and updated as appropriate:  She  has a past medical history of Depression. She does not have any pertinent problems on file. 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 Her family history includes Arthritis in her father; Breast cancer in her mother; Cancer in her father; Cancer (age of onset: 14) in her mother. She  reports that she has been smoking cigarettes. She has a 17.50 pack-year smoking history. She has never used smokeless tobacco. She  reports current alcohol use of about 1.0 standard drinks of alcohol per week. She reports that she does not use drugs. She has a current medication list which includes the following prescription(s): multivitamin. Current Outpatient Medications on File Prior to Visit  Medication Sig Dispense Refill  . Multiple Vitamin (MULTIVITAMIN) capsule Take 1 capsule by mouth daily.     No current facility-administered medications on file prior to visit.   She is allergic to azithromycin; oxycodone; and naproxen..  Review of Systems Review of Systems  Constitutional: Negative for activity change, appetite change and fatigue.  HENT: Negative for hearing loss, congestion, tinnitus and ear discharge.  dentist q53m Eyes: Negative for visual  disturbance (see optho q1y -- vision corrected to 20/20 with glasses).  Respiratory: Negative for cough, chest tightness and shortness of breath.   Cardiovascular: Negative for chest pain, palpitations and leg swelling.  Gastrointestinal: Negative for abdominal pain, diarrhea, constipation and abdominal distention.  Genitourinary: Negative for urgency, frequency, decreased urine volume and difficulty urinating.  Musculoskeletal: Negative for back pain, arthralgias and gait problem.  Skin: Negative for color change, pallor and rash.  Neurological: Negative for dizziness, light-headedness, numbness and headaches.  Hematological: Negative for adenopathy. Does not bruise/bleed easily.  Psychiatric/Behavioral: Negative for suicidal ideas, confusion, sleep disturbance, self-injury, dysphoric mood, decreased concentration and agitation.       Objective:    BP 120/80 (BP Location: Left Arm, Patient Position: Sitting, Cuff Size: Normal)   Pulse 84   Temp 97.9 F (36.6 C) (Temporal)   Resp 18   Ht 5\' 7"  (1.702 m)   Wt 174 lb 12.8 oz (79.3 kg)   SpO2 97%   BMI 27.38 kg/m  General appearance: alert, cooperative, appears stated age and no distress Head: Normocephalic, without obvious abnormality, atraumatic Eyes: negative findings: lids and lashes normal, conjunctivae and sclerae normal and pupils equal, round, reactive to light and accomodation Ears: normal TM's and external ear canals both ears Nose: Nares normal. Septum midline. Mucosa normal. No drainage or sinus tenderness. Throat: lips, mucosa, and tongue normal; teeth and gums normal Neck: no adenopathy, no carotid bruit, no JVD, supple, symmetrical, trachea midline and thyroid not enlarged, symmetric, no tenderness/mass/nodules Back: symmetric, no curvature. ROM normal. No CVA tenderness. Lungs: clear to auscultation bilaterally Breasts: normal appearance, no masses or tenderness Heart: regular rate and rhythm, S1, S2 normal, no  murmur, click, rub or gallop Abdomen: soft, non-tender; bowel sounds normal; no masses,  no organomegaly Pelvic: deferred Extremities: extremities normal, atraumatic, no cyanosis or edema Pulses: 2+ and symmetric Skin: Skin color, texture, turgor normal. No rashes or lesions Lymph nodes: Cervical, supraclavicular, and axillary nodes normal. Neurologic: Alert and oriented X 3, normal strength and tone. Normal symmetric reflexes. Normal coordination and gait    Assessment:    Healthy female exam.      Plan:    ghm utd Check labs  See After Visit Summary for Counseling Recommendations    1. Preventative health care Check labs  See above - Lipid panel - CBC with Differential/Platelet - TSH - Comprehensive metabolic panel

## 2019-07-23 ENCOUNTER — Ambulatory Visit (INDEPENDENT_AMBULATORY_CARE_PROVIDER_SITE_OTHER): Payer: Medicare Other | Admitting: Family Medicine

## 2019-07-23 ENCOUNTER — Other Ambulatory Visit: Payer: Self-pay

## 2019-07-23 ENCOUNTER — Encounter: Payer: Self-pay | Admitting: Family Medicine

## 2019-07-23 ENCOUNTER — Ambulatory Visit (HOSPITAL_BASED_OUTPATIENT_CLINIC_OR_DEPARTMENT_OTHER)
Admission: RE | Admit: 2019-07-23 | Discharge: 2019-07-23 | Disposition: A | Discharge status: Home / Self Care | Payer: Medicare Other | Source: Ambulatory Visit | Attending: Family Medicine | Admitting: Family Medicine

## 2019-07-23 VITALS — BP 132/88 | HR 88 | Temp 97.1°F | Resp 18 | Ht 67.0 in | Wt 158.8 lb

## 2019-07-23 DIAGNOSIS — M25511 Pain in right shoulder: Secondary | ICD-10-CM

## 2019-07-23 MED ORDER — TRAMADOL HCL 50 MG PO TABS: 50.0000 mg | ORAL_TABLET | Freq: Three times a day (TID) | ORAL | 0 refills | Status: AC | PRN

## 2019-07-23 MED ORDER — PREDNISONE 10 MG PO TABS: ORAL_TABLET | ORAL | 0 refills | Status: DC

## 2019-07-23 NOTE — Progress Notes (Signed)
Patient ID: Lelon Huh, female    DOB: September 01, 1955  Age: 64 y.o. MRN: [id number]    Subjective:  Subjective  HPI Jonnette L Walt presents for R shoulder pain -- no fall.  It started last Thursday and is progressively getting worse.  She has been using ice/ heat and tylenol -- with little relief  Review of Systems  Constitutional: Negative for appetite change, diaphoresis, fatigue and unexpected weight change.  Eyes: Negative for pain, redness and visual disturbance.  Respiratory: Negative for cough, chest tightness, shortness of breath and wheezing.   Cardiovascular: Negative for chest pain, palpitations and leg swelling.  Endocrine: Negative for cold intolerance, heat intolerance, polydipsia, polyphagia and polyuria.  Genitourinary: Negative for difficulty urinating, dysuria and frequency.  Musculoskeletal: Positive for arthralgias and joint swelling.  Neurological: Negative for dizziness, light-headedness, numbness and headaches.    History Past Medical History:  Diagnosis Date  . 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   Her family history includes Arthritis in her father; Breast cancer in her mother; Cancer in her father; Cancer (age of onset: 44) in her mother.She reports that she has been smoking cigarettes. She has a 17.50 pack-year smoking history. She has never used smokeless tobacco. She reports current alcohol use of about 1.0 standard drinks of alcohol per week. She reports that she does not use drugs.  Current Outpatient Medications on File Prior to Visit  Medication Sig Dispense Refill  . Multiple Vitamin (MULTIVITAMIN) capsule Take 1 capsule by mouth daily.     No current facility-administered medications on file prior to visit.     Objective:   Objective  Physical Exam Vitals and nursing note reviewed.  Constitutional:      Appearance: She is well-developed.  HENT:     Head: Normocephalic and atraumatic.  Eyes:     Conjunctiva/sclera: Conjunctivae normal.  Neck:     Thyroid: No thyromegaly.     Vascular: No carotid bruit or JVD.  Cardiovascular:     Rate and Rhythm: Normal rate and regular rhythm.     Heart sounds: Normal heart sounds. No murmur.  Pulmonary:     Effort: Pulmonary effort is normal. No respiratory distress.     Breath sounds: Normal breath sounds. No wheezing or rales.  Chest:     Chest wall: No tenderness.  Musculoskeletal:        General: Tenderness present. No swelling.     Cervical back: Normal range of motion and neck supple.  Neurological:     Mental Status: She is alert and oriented to person, place, and time.    BP 132/88 (BP Location: Left Arm, Patient Position: Sitting, Cuff Size: Normal)   Pulse 88   Temp (!) 97.1 F (36.2 C) (Temporal)   Resp 18   Ht 5\' 7"  (1.702 m)   Wt 158 lb 12.8 oz (72 kg)   SpO2 100%   BMI 24.87 kg/m  Wt Readings from Last 3 Encounters:  07/23/19 158 lb 12.8 oz (72 kg)  05/11/19 174 lb 12.8 oz (79.3 kg)  05/20/18 162 lb 12.8 oz (73.8 kg)     Lab Results  Component Value Date   WBC 6.2 05/11/2019   HGB 14.1 05/11/2019   HCT 41.1 05/11/2019   PLT 225.0 05/11/2019   GLUCOSE 82 05/11/2019  CHOL 200 05/11/2019   TRIG 129.0 05/11/2019   HDL 70.10 05/11/2019   LDLDIRECT 104.0 04/21/2018   LDLCALC 104 (H) 05/11/2019   ALT 19 05/11/2019   AST 23 05/11/2019   NA 138 05/11/2019   K 4.2 05/11/2019   CL 105 05/11/2019   CREATININE 0.91 05/11/2019   BUN 23 05/11/2019   CO2 24 05/11/2019   TSH 1.49 05/11/2019    No results found.   Assessment & Plan:  Plan  I am having Amariss L. Skyles start on predniSONE and traMADol. I am also having her maintain her multivitamin.  Meds ordered this encounter  Medications  . predniSONE (DELTASONE) 10 MG tablet     Sig: TAKE 3 TABLETS PO QD FOR 3 DAYS THEN TAKE 2 TABLETS PO QD FOR 3 DAYS THEN TAKE 1 TABLET PO QD FOR 3 DAYS THEN TAKE 1/2 TAB PO QD FOR 3 DAYS    Dispense:  20 tablet    Refill:  0  . traMADol (ULTRAM) 50 MG tablet    Sig: Take 1 tablet (50 mg total) by mouth every 8 (eight) hours as needed for up to 5 days.    Dispense:  15 tablet    Refill:  0    Problem List Items Addressed This Visit    None    Visit Diagnoses    Acute pain of right shoulder    -  Primary   Relevant Medications   predniSONE (DELTASONE) 10 MG tablet   traMADol (ULTRAM) 50 MG tablet   Other Relevant Orders   DG Shoulder Right (Completed)    ice / heat -- rest  Sport med/ortho prn   Follow-up: Return if symptoms worsen or fail to improve.  [name], DO

## 2019-07-23 NOTE — Patient Instructions (Signed)
Shoulder Pain Many things can cause shoulder pain, including:  An injury to the shoulder.  Overuse of the shoulder.  Arthritis. The source of the pain can be:  Inflammation.  An injury to the shoulder joint.  An injury to a tendon, ligament, or bone. Follow these instructions at home: Pay attention to changes in your symptoms. Let your health care provider know about them. Follow these instructions to relieve your pain. If you have a sling:  Wear the sling as told by your health care provider. Remove it only as told by your health care provider.  Loosen the sling if your fingers tingle, become numb, or turn cold and blue.  Keep the sling clean.  If the sling is not waterproof: ? Do not let it get wet. Remove it to shower or bathe.  Move your arm as little as possible, but keep your hand moving to prevent swelling. Managing pain, stiffness, and swelling   If directed, put ice on the painful area: ? Put ice in a plastic bag. ? Place a towel between your skin and the bag. ? Leave the ice on for 20 minutes, 2-3 times per day. Stop applying ice if it does not help with the pain.  Squeeze a soft ball or a foam pad as much as possible. This helps to keep the shoulder from swelling. It also helps to strengthen the arm. General instructions  Take over-the-counter and prescription medicines only as told by your health care provider.  Keep all follow-up visits as told by your health care provider. This is important. Contact a health care provider if:  Your pain gets worse.  Your pain is not relieved with medicines.  New pain develops in your arm, hand, or fingers. Get help right away if:  Your arm, hand, or fingers: ? Tingle. ? Become numb. ? Become swollen. ? Become painful. ? Turn white or blue. Summary  Shoulder pain can be caused by an injury, overuse, or arthritis.  Pay attention to changes in your symptoms. Let your health care provider know about  them.  This condition may be treated with a sling, ice, and pain medicines.  Contact your health care provider if the pain gets worse or new pain develops. Get help right away if your arm, hand, or fingers tingle or become numb, swollen, or painful.  Keep all follow-up visits as told by your health care provider. This is important. This information is not intended to replace advice given to you by your health care provider. Make sure you discuss any questions you have with your health care provider. Document Revised: 09/03/2017 Document Reviewed: 09/03/2017 Elsevier Patient Education  2020 [employer]  

## 2019-08-07 ENCOUNTER — Encounter: Payer: Self-pay | Admitting: Family Medicine

## 2019-08-07 DIAGNOSIS — M25511 Pain in right shoulder: Secondary | ICD-10-CM

## 2019-08-07 NOTE — Telephone Encounter (Signed)
Refer to sport med  

## 2019-08-10 ENCOUNTER — Telehealth: Payer: Self-pay

## 2019-08-10 NOTE — Telephone Encounter (Signed)
Patient called in to speak with the nurse about her referral for sports medicine that Dr. Etter Sjogren had put in. Please give the patient a call to discuss and advise at [phone]

## 2019-08-10 NOTE — Telephone Encounter (Signed)
LVM and advised patient that we are waiting to here back from referral coordinator to see if referral can be changed or if a new referral needs to be placed.

## 2019-08-17 ENCOUNTER — Encounter: Payer: Self-pay | Admitting: Family Medicine

## 2019-08-17 ENCOUNTER — Ambulatory Visit: Payer: Medicare Other | Admitting: Family Medicine

## 2019-08-17 ENCOUNTER — Ambulatory Visit: Payer: Self-pay

## 2019-08-17 ENCOUNTER — Other Ambulatory Visit: Payer: Self-pay

## 2019-08-17 VITALS — BP 161/87 | HR 87 | Ht 66.0 in | Wt 155.0 lb

## 2019-08-17 DIAGNOSIS — M25511 Pain in right shoulder: Secondary | ICD-10-CM

## 2019-08-17 DIAGNOSIS — M7501 Adhesive capsulitis of right shoulder: Secondary | ICD-10-CM | POA: Diagnosis not present

## 2019-08-17 DIAGNOSIS — M65811 Other synovitis and tenosynovitis, right shoulder: Secondary | ICD-10-CM | POA: Insufficient documentation

## 2019-08-17 MED ORDER — TRIAMCINOLONE ACETONIDE 40 MG/ML IJ SUSP
40.0000 mg | Freq: Once | INTRAMUSCULAR | Status: AC
  Administered 2019-08-17: 40 mg via INTRA_ARTICULAR

## 2019-08-17 NOTE — Patient Instructions (Signed)
Nice to meet you Please try heat before the exercises and ice after  Please try the exercises   Please send me a message in MyChart with any questions or updates.  Please see me back in 4 weeks.   --Dr. Raeford Razor

## 2019-08-17 NOTE — Assessment & Plan Note (Signed)
Symptoms more associated with capsulitis as opposed to rotator cuff. -Glenohumeral injection. -Counseled on home exercise therapy and supportive care. -Consider physical therapy or imaging

## 2019-08-17 NOTE — Progress Notes (Signed)
Ashley Salinas - 64 y.o. female MRN [id number]  Date of birth: 1956/02/04  SUBJECTIVE:  Including CC & ROS.  Chief Complaint  Patient presents with  . Shoulder Pain    right x 1 month    Ashley Salinas is a 64 y.o. female that is presenting with 1 month of right shoulder pain.  Denies any specific inciting event.  Seems localized to the shoulder.  Problems with brushing her hair and putting on clothes.  No history of similar pain.  No history of surgery..  Independent review of the right shoulder x-ray from 5/20 shows no acute changes.   Review of Systems See HPI   HISTORY: Past Medical, Surgical, Social, and Family History Reviewed & Updated per EMR.   Pertinent Historical Findings include:  Past Medical History:  Diagnosis Date  . Depression     Past Surgical History:  Procedure Laterality Date  . APPENDECTOMY  1990  . BREAST ENHANCEMENT SURGERY    . LUMBAR FUSION  03/21/2007  . SMALL INTESTINE SURGERY  1990  . 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 TOTAL HIP ARTHROPLASTY     Left- Dr.Moore  . TUBAL LIGATION  2004    Family History  Problem Relation Age of Onset  . Breast cancer Mother   . Cancer Mother 57       breast  . Arthritis Father   . Cancer Father        multiple myeloma  stage 3B    Social History   Socioeconomic History  . Marital status: Widowed    Spouse name: Not on file  . Number of children: Not on file  . Years of education: Not on file  . Highest education level: Not on file  Occupational History    Comment: unemployed-- looking for another job  Tobacco Use  . Smoking status: Current Some Day Smoker    Packs/day: 0.50    Years: 35.00    Pack years: 17.50    Types: Cigarettes    Last attempt to quit: 10/04/2010    Years since quitting: 8.8  . Smokeless tobacco: Never Used  . Tobacco comment: smoking on and off-- few cig here and there  Substance and Sexual Activity  . Alcohol use: Yes     Alcohol/week: 1.0 standard drink    Types: 1 Glasses of wine per week    Comment: rare-- actually < 1x   . Drug use: No  . Sexual activity: Yes    Partners: Male  Other Topics Concern  . Not on file  Social History Narrative   Exercise-  no   Social Determinants of Health   Financial Resource Strain: Low Risk   . Difficulty of Paying Living Expenses: Not hard at all  Food Insecurity:   . Worried About Charity fundraiser in the Last Year:   . Arboriculturist in the Last Year:   Transportation Needs: No Transportation Needs  . Lack of Transportation (Medical): No  . Lack of Transportation (Non-Medical): No  Physical Activity:   . Days of Exercise per Week:   . Minutes of Exercise per Session:   Stress:   . Feeling of Stress :   Social Connections:   . Frequency of Communication with Friends and Family:   . Frequency of Social Gatherings with Friends and Family:   . Attends Religious Services:   . Active Member of Clubs or Organizations:   .  Attends Archivist Meetings:   Marland Kitchen Marital Status:   Intimate Partner Violence:   . Fear of Current or Ex-Partner:   . Emotionally Abused:   Marland Kitchen Physically Abused:   . Sexually Abused:      PHYSICAL EXAM:  VS: BP (!) 161/87   Pulse 87   Ht '5\' 6"'  (1.676 m)   Wt 155 lb (70.3 kg)   BMI 25.02 kg/m  Physical Exam Gen: NAD, alert, cooperative with exam, well-appearing MSK:  Right shoulder: Limited external rotation. Pain with external rotation abduction. Normal strength resistance. Normal empty can test. Normal speeds test. Some pain with O'Brien's test. Neurovascularly intact  Limited ultrasound: Right shoulder:  Mild effusion around the biceps tendon.  Normal-appearing biceps tendon. Normal-appearing subscapularis. Mild chronic changes of the supraspinatus. No effusion noted in the posterior glenohumeral joint.  Summary: Mild chronic changes appreciated of the rotator cuff  Ultrasound and interpretation by  Clearance Coots, MD   Aspiration/Injection Procedure Note Ashley Salinas [birth year]  Procedure: Injection Indications: Right shoulder pain  Procedure Details Consent: Risks of procedure as well as the alternatives and risks of each were explained to the (patient/caregiver).  Consent for procedure obtained. Time Out: Verified patient identification, verified procedure, site/side was marked, verified correct patient position, special equipment/implants available, medications/allergies/relevent history reviewed, required imaging and test results available.  Performed.  The area was cleaned with iodine and alcohol swabs.    The right glenohumeral joint was injected using 3cc's 1% lidocaine, 1 cc's of 40 mg Kenalog and 3 cc's of 0.25% bupivacaine with a 22 3 1/2" needle.  Ultrasound was used. Images were obtained in short views showing the injection.     A sterile dressing was applied.  Patient did tolerate procedure well.    ASSESSMENT & PLAN:   Adhesive capsulitis of right shoulder Symptoms more associated with capsulitis as opposed to rotator cuff. -Glenohumeral injection. -Counseled on home exercise therapy and supportive care. -Consider physical therapy or imaging

## 2019-09-14 ENCOUNTER — Ambulatory Visit: Payer: Medicare Other | Admitting: Family Medicine

## 2019-09-14 ENCOUNTER — Other Ambulatory Visit: Payer: Self-pay

## 2019-09-14 ENCOUNTER — Encounter: Payer: Self-pay | Admitting: Family Medicine

## 2019-09-14 DIAGNOSIS — M7501 Adhesive capsulitis of right shoulder: Secondary | ICD-10-CM | POA: Diagnosis not present

## 2019-09-14 NOTE — Patient Instructions (Signed)
Good to see you Please continue the exercises  Please try heat before and ice after exercises   Please send me a message in MyChart with any questions or updates.  Please see me back in 2 months.   --Dr. Raeford Razor

## 2019-09-14 NOTE — Progress Notes (Signed)
Ashley Salinas - 64 y.o. female MRN [id number]  Date of birth: Aug 10, 1955  SUBJECTIVE:  Including CC & ROS.  Chief Complaint  Patient presents with  . Follow-up    right shoulder    Ashley Salinas is a 64 y.o. female that is following up for her right shoulder pain.  Has been doing well since the injection.  Has some soreness from time to time but has much improved.  Denies any radicular symptoms.   Review of Systems See HPI   HISTORY: Past Medical, Surgical, Social, and Family History Reviewed & Updated per EMR.   Pertinent Historical Findings include:  Past Medical History:  Diagnosis Date  . Depression     Past Surgical History:  Procedure Laterality Date  . APPENDECTOMY  1990  . BREAST ENHANCEMENT SURGERY    . LUMBAR FUSION  03/21/2007  . SMALL INTESTINE SURGERY  1990  . 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 TOTAL HIP ARTHROPLASTY     Left- Dr.Moore  . TUBAL LIGATION  2004    Family History  Problem Relation Age of Onset  . Breast cancer Mother   . Cancer Mother 54       breast  . Arthritis Father   . Cancer Father        multiple myeloma  stage 3B    Social History   Socioeconomic History  . Marital status: Widowed    Spouse name: Not on file  . Number of children: Not on file  . Years of education: Not on file  . Highest education level: Not on file  Occupational History    Comment: unemployed-- looking for another job  Tobacco Use  . Smoking status: Current Some Day Smoker    Packs/day: 0.50    Years: 35.00    Pack years: 17.50    Types: Cigarettes    Last attempt to quit: 10/04/2010    Years since quitting: 8.9  . Smokeless tobacco: Never Used  . Tobacco comment: smoking on and off-- few cig here and there  Substance and Sexual Activity  . Alcohol use: Yes    Alcohol/week: 1.0 standard drink    Types: 1 Glasses of wine per week    Comment: rare-- actually < 1x   . Drug use: No  . Sexual activity: Yes     Partners: Male  Other Topics Concern  . Not on file  Social History Narrative   Exercise-  no   Social Determinants of Health   Financial Resource Strain: Low Risk   . Difficulty of Paying Living Expenses: Not hard at all  Food Insecurity:   . Worried About Charity fundraiser in the Last Year:   . Arboriculturist in the Last Year:   Transportation Needs: No Transportation Needs  . Lack of Transportation (Medical): No  . Lack of Transportation (Non-Medical): No  Physical Activity:   . Days of Exercise per Week:   . Minutes of Exercise per Session:   Stress:   . Feeling of Stress :   Social Connections:   . Frequency of Communication with Friends and Family:   . Frequency of Social Gatherings with Friends and Family:   . Attends Religious Services:   . Active Member of Clubs or Organizations:   . Attends Archivist Meetings:   Marland Kitchen Marital Status:   Intimate Partner Violence:   . Fear of Current or Ex-Partner:   .  Emotionally Abused:   Marland Kitchen Physically Abused:   . Sexually Abused:      PHYSICAL EXAM:  VS: BP 135/81   Pulse 75   Ht '5\' 6"'  (1.676 m)   Wt 155 lb (70.3 kg)   BMI 25.02 kg/m  Physical Exam Gen: NAD, alert, cooperative with exam, well-appearing MSK:  Right shoulder: Lacks external rotation. Has good flexion and abduction. Normal empty can test. Neurovascularly intact     ASSESSMENT & PLAN:   Adhesive capsulitis of right shoulder Has been doing well with the injection. Lacks full external rotation  -Counseled on home exercise therapy and supportive care. -Could consider physical therapy.

## 2019-09-14 NOTE — Assessment & Plan Note (Signed)
Has been doing well with the injection. Lacks full external rotation  -Counseled on home exercise therapy and supportive care. -Could consider physical therapy.

## 2019-10-14 DIAGNOSIS — H35413 Lattice degeneration of retina, bilateral: Secondary | ICD-10-CM | POA: Diagnosis not present

## 2019-10-14 DIAGNOSIS — H25813 Combined forms of age-related cataract, bilateral: Secondary | ICD-10-CM | POA: Diagnosis not present

## 2019-10-14 DIAGNOSIS — H43811 Vitreous degeneration, right eye: Secondary | ICD-10-CM | POA: Diagnosis not present

## 2019-10-14 DIAGNOSIS — H04123 Dry eye syndrome of bilateral lacrimal glands: Secondary | ICD-10-CM | POA: Diagnosis not present

## 2019-11-10 ENCOUNTER — Ambulatory Visit: Payer: Medicare Other | Admitting: Family Medicine

## 2019-11-10 ENCOUNTER — Encounter: Payer: Self-pay | Admitting: Family Medicine

## 2019-11-10 ENCOUNTER — Ambulatory Visit: Payer: Self-pay

## 2019-11-10 ENCOUNTER — Other Ambulatory Visit: Payer: Self-pay

## 2019-11-10 VITALS — BP 135/86 | HR 90 | Ht 66.0 in | Wt 155.0 lb

## 2019-11-10 DIAGNOSIS — M7551 Bursitis of right shoulder: Secondary | ICD-10-CM | POA: Insufficient documentation

## 2019-11-10 MED ORDER — METHYLPREDNISOLONE ACETATE 40 MG/ML IJ SUSP
40.0000 mg | Freq: Once | INTRAMUSCULAR | Status: AC
  Administered 2019-11-10: 40 mg via INTRA_ARTICULAR

## 2019-11-10 NOTE — Progress Notes (Signed)
Ashley Salinas - 64 y.o. female MRN [id number]  Date of birth: 04/24/1955  SUBJECTIVE:  Including CC & ROS.  Chief Complaint  Patient presents with  . Follow-up    right shoulder    Ashley Salinas is a 64 y.o. female that is presenting with acute worsening of her right shoulder pain.  Had initially frozen shoulder and received a glenohumeral injection.  She is having pain laterally with radiation down to the elbow.  Does not wake her up at night.  She notices it with certain internal rotation movements.   Review of Systems See HPI   HISTORY: Past Medical, Surgical, Social, and Family History Reviewed & Updated per EMR.   Pertinent Historical Findings include:  Past Medical History:  Diagnosis Date  . Depression     Past Surgical History:  Procedure Laterality Date  . APPENDECTOMY  1990  . BREAST ENHANCEMENT SURGERY    . LUMBAR FUSION  03/21/2007  . SMALL INTESTINE SURGERY  1990  . 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 TOTAL HIP ARTHROPLASTY     Left- Dr.Moore  . TUBAL LIGATION  2004    Family History  Problem Relation Age of Onset  . Breast cancer Mother   . Cancer Mother 72       breast  . Arthritis Father   . Cancer Father        multiple myeloma  stage 3B    Social History   Socioeconomic History  . Marital status: Widowed    Spouse name: Not on file  . Number of children: Not on file  . Years of education: Not on file  . Highest education level: Not on file  Occupational History    Comment: unemployed-- looking for another job  Tobacco Use  . Smoking status: Current Some Day Smoker    Packs/day: 0.50    Years: 35.00    Pack years: 17.50    Types: Cigarettes    Last attempt to quit: 10/04/2010    Years since quitting: 9.1  . Smokeless tobacco: Never Used  . Tobacco comment: smoking on and off-- few cig here and there  Substance and Sexual Activity  . Alcohol use: Yes    Alcohol/week: 1.0 standard drink    Types: 1  Glasses of wine per week    Comment: rare-- actually < 1x   . Drug use: No  . Sexual activity: Yes    Partners: Male  Other Topics Concern  . Not on file  Social History Narrative   Exercise-  no   Social Determinants of Health   Financial Resource Strain: Low Risk   . Difficulty of Paying Living Expenses: Not hard at all  Food Insecurity:   . Worried About Charity fundraiser in the Last Year: Not on file  . Ran Out of Food in the Last Year: Not on file  Transportation Needs: No Transportation Needs  . Lack of Transportation (Medical): No  . Lack of Transportation (Non-Medical): No  Physical Activity:   . Days of Exercise per Week: Not on file  . Minutes of Exercise per Session: Not on file  Stress:   . Feeling of Stress : Not on file  Social Connections:   . Frequency of Communication with Friends and Family: Not on file  . Frequency of Social Gatherings with Friends and Family: Not on file  . Attends Religious Services: Not on file  . Active Member  of Clubs or Organizations: Not on file  . Attends Archivist Meetings: Not on file  . Marital Status: Not on file  Intimate Partner Violence:   . Fear of Current or Ex-Partner: Not on file  . Emotionally Abused: Not on file  . Physically Abused: Not on file  . Sexually Abused: Not on file     PHYSICAL EXAM:  VS: BP 135/86   Pulse 90   Ht '5\' 6"'  (1.676 m)   Wt 155 lb (70.3 kg)   BMI 25.02 kg/m  Physical Exam Gen: NAD, alert, cooperative with exam, well-appearing MSK:  Right shoulder: Near normal external rotation and abduction and external rotation. Pain with internal rotation. Pain with empty can test. Neurovascularly intact   Aspiration/Injection Procedure Note Tannia L Santosuosso 11-02-1955  Procedure: Injection Indications: Right shoulder pain  Procedure Details Consent: Risks of procedure as well as the alternatives and risks of each were explained to the (patient/caregiver).  Consent for procedure  obtained. Time Out: Verified patient identification, verified procedure, site/side was marked, verified correct patient position, special equipment/implants available, medications/allergies/relevent history reviewed, required imaging and test results available.  Performed.  The area was cleaned with iodine and alcohol swabs.    The right subacromial space was injected using 1 cc's of 40 mg Depo-Medrol and 4 cc's of 0.25% bupivacaine with a 22 1 1/2" needle.  Ultrasound was used. Images were obtained in long views showing the injection.     A sterile dressing was applied.  Patient did tolerate procedure well.     ASSESSMENT & PLAN:   Subacromial bursitis of right shoulder joint Pain seems more subacromial today as opposed to frozen shoulder.  Has good range of motion.  Has translation of the humeral head with abduction. -Counseled on home exercise therapy and supportive care. -Injection. -Could consider physical therapy.

## 2019-11-10 NOTE — Addendum Note (Signed)
Addended by: Sherrie George F on: 11/10/2019 01:08 PM   Modules accepted: Orders

## 2019-11-10 NOTE — Patient Instructions (Signed)
Good to see you Please try ice  Please try the exercises  Please send me a message in MyChart with any questions or updates.  Please see me back in 4 weeks.   --Dr. Chao Blazejewski  

## 2019-11-10 NOTE — Assessment & Plan Note (Signed)
Pain seems more subacromial today as opposed to frozen shoulder.  Has good range of motion.  Has translation of the humeral head with abduction. -Counseled on home exercise therapy and supportive care. -Injection. -Could consider physical therapy.

## 2019-11-30 DIAGNOSIS — H43811 Vitreous degeneration, right eye: Secondary | ICD-10-CM | POA: Diagnosis not present

## 2019-11-30 DIAGNOSIS — H25813 Combined forms of age-related cataract, bilateral: Secondary | ICD-10-CM | POA: Diagnosis not present

## 2019-11-30 DIAGNOSIS — H35413 Lattice degeneration of retina, bilateral: Secondary | ICD-10-CM | POA: Diagnosis not present

## 2019-11-30 DIAGNOSIS — H04123 Dry eye syndrome of bilateral lacrimal glands: Secondary | ICD-10-CM | POA: Diagnosis not present

## 2019-12-01 ENCOUNTER — Encounter: Payer: Self-pay | Admitting: Family Medicine

## 2019-12-01 ENCOUNTER — Other Ambulatory Visit: Payer: Self-pay | Admitting: Family Medicine

## 2019-12-01 DIAGNOSIS — D179 Benign lipomatous neoplasm, unspecified: Secondary | ICD-10-CM

## 2019-12-01 NOTE — Telephone Encounter (Signed)
Referral placed.

## 2019-12-15 ENCOUNTER — Other Ambulatory Visit: Payer: Self-pay

## 2019-12-15 ENCOUNTER — Ambulatory Visit: Payer: Medicare Other | Admitting: Family Medicine

## 2019-12-15 ENCOUNTER — Encounter: Payer: Self-pay | Admitting: Family Medicine

## 2019-12-15 VITALS — BP 129/84 | HR 80 | Ht 66.0 in | Wt 155.0 lb

## 2019-12-15 DIAGNOSIS — M7501 Adhesive capsulitis of right shoulder: Secondary | ICD-10-CM | POA: Diagnosis not present

## 2019-12-15 MED ORDER — HYDROCODONE-ACETAMINOPHEN 5-325 MG PO TABS: 1.0000 | ORAL_TABLET | Freq: Three times a day (TID) | ORAL | 0 refills | Status: DC | PRN

## 2019-12-15 NOTE — Patient Instructions (Signed)
Good to see you Please continue the exercises  Please use the norco as needed for severe pain  Physical therapy will give you a call.   Please send me a message in MyChart with any questions or updates.  Please see me back in 6-8 weeks.   --Dr. Raeford Razor

## 2019-12-15 NOTE — Progress Notes (Signed)
Ashley Salinas - 64 y.o. female MRN [id number]  Date of birth: 1955/06/12  SUBJECTIVE:  Including CC & ROS.  Chief Complaint  Patient presents with  . Follow-up    right shoulder    Ashley Salinas is a 64 y.o. female that is presenting with worsening of her right shoulder pain. She is get some improvement with the subacromial injection. She still has some stiffness and pain at night. She has been limited with her range of motion for period of time.   Review of Systems See HPI   HISTORY: Past Medical, Surgical, Social, and Family History Reviewed & Updated per EMR.   Pertinent Historical Findings include:  Past Medical History:  Diagnosis Date  . Depression     Past Surgical History:  Procedure Laterality Date  . APPENDECTOMY  1990  . BREAST ENHANCEMENT SURGERY    . LUMBAR FUSION  03/21/2007  . SMALL INTESTINE SURGERY  1990  . 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 TOTAL HIP ARTHROPLASTY     Left- Dr.Moore  . TUBAL LIGATION  2004    Family History  Problem Relation Age of Onset  . Breast cancer Mother   . Cancer Mother 4       breast  . Arthritis Father   . Cancer Father        multiple myeloma  stage 3B    Social History   Socioeconomic History  . Marital status: Widowed    Spouse name: Not on file  . Number of children: Not on file  . Years of education: Not on file  . Highest education level: Not on file  Occupational History    Comment: unemployed-- looking for another job  Tobacco Use  . Smoking status: Current Some Day Smoker    Packs/day: 0.50    Years: 35.00    Pack years: 17.50    Types: Cigarettes    Last attempt to quit: 10/04/2010    Years since quitting: 9.2  . Smokeless tobacco: Never Used  . Tobacco comment: smoking on and off-- few cig here and there  Substance and Sexual Activity  . Alcohol use: Yes    Alcohol/week: 1.0 standard drink    Types: 1 Glasses of wine per week    Comment: rare-- actually <  1x   . Drug use: No  . Sexual activity: Yes    Partners: Male  Other Topics Concern  . Not on file  Social History Narrative   Exercise-  no   Social Determinants of Health   Financial Resource Strain: Low Risk   . Difficulty of Paying Living Expenses: Not hard at all  Food Insecurity:   . Worried About Charity fundraiser in the Last Year: Not on file  . Ran Out of Food in the Last Year: Not on file  Transportation Needs: No Transportation Needs  . Lack of Transportation (Medical): No  . Lack of Transportation (Non-Medical): No  Physical Activity:   . Days of Exercise per Week: Not on file  . Minutes of Exercise per Session: Not on file  Stress:   . Feeling of Stress : Not on file  Social Connections:   . Frequency of Communication with Friends and Family: Not on file  . Frequency of Social Gatherings with Friends and Family: Not on file  . Attends Religious Services: Not on file  . Active Member of Clubs or Organizations: Not on file  .  Attends Archivist Meetings: Not on file  . Marital Status: Not on file  Intimate Partner Violence:   . Fear of Current or Ex-Partner: Not on file  . Emotionally Abused: Not on file  . Physically Abused: Not on file  . Sexually Abused: Not on file     PHYSICAL EXAM:  VS: BP 129/84   Pulse 80   Ht '5\' 6"'  (1.676 m)   Wt 155 lb (70.3 kg)   BMI 25.02 kg/m  Physical Exam Gen: NAD, alert, cooperative with exam, well-appearing MSK:  Right shoulder: Limitation with external rotation as well as abduction and flexion. Normal strength resistance. Neurovascularly intact     ASSESSMENT & PLAN:   Adhesive capsulitis of right shoulder Still having range of motion issues and pain at night. Seems more capsular as opposed to rotator cuff at this time. -Counseled on home exercise therapy and supportive care. -Referral to physical therapy. -Norco. -May need to consider further imaging or glenohumeral injection if  ongoing.

## 2019-12-15 NOTE — Assessment & Plan Note (Signed)
Still having range of motion issues and pain at night. Seems more capsular as opposed to rotator cuff at this time. -Counseled on home exercise therapy and supportive care. -Referral to physical therapy. -Norco. -May need to consider further imaging or glenohumeral injection if ongoing.

## 2019-12-28 ENCOUNTER — Encounter: Payer: Self-pay | Admitting: Physical Therapy

## 2019-12-28 ENCOUNTER — Ambulatory Visit: Payer: Medicare Other | Attending: Family Medicine | Admitting: Physical Therapy

## 2019-12-28 ENCOUNTER — Other Ambulatory Visit: Payer: Self-pay

## 2019-12-28 DIAGNOSIS — M6281 Muscle weakness (generalized): Secondary | ICD-10-CM | POA: Diagnosis not present

## 2019-12-28 DIAGNOSIS — R29898 Other symptoms and signs involving the musculoskeletal system: Secondary | ICD-10-CM | POA: Diagnosis not present

## 2019-12-28 DIAGNOSIS — M25511 Pain in right shoulder: Secondary | ICD-10-CM | POA: Insufficient documentation

## 2019-12-28 DIAGNOSIS — M25611 Stiffness of right shoulder, not elsewhere classified: Secondary | ICD-10-CM | POA: Insufficient documentation

## 2019-12-28 DIAGNOSIS — R293 Abnormal posture: Secondary | ICD-10-CM | POA: Diagnosis not present

## 2019-12-28 NOTE — Patient Instructions (Signed)
    Home exercise program created by Doyt Castellana, PT.  For questions, please contact Cordarious Zeek via phone at [phone] or email at [email or web address]  [facility] [place] [address]  [address] [place], [place], [address] Phone: [phone]   Fax:  [phone]    

## 2019-12-28 NOTE — Therapy (Signed)
[place] [address]  [place], [place], [address] Phone: [phone]   Fax:  [phone]  Physical Therapy Evaluation  Patient Details  Name: CHARICE ZUNO MRN: [id number] Date of Birth: [birth year] Referring Provider (PT): Clearance Coots, MD   Encounter Date: 12/28/2019   PT End of Session - 12/28/19 1402    Visit Number 1    Number of Visits 16    Date for PT Re-Evaluation 02/22/20    Authorization Type U[employer] Medicare    PT Start Time 1402    PT Stop Time 1454    PT Time Calculation (min) 52 min    Activity Tolerance Patient tolerated treatment well    Behavior During Therapy [facility] for tasks assessed/performed           Past Medical History:  Diagnosis Date  . Depression     Past Surgical History:  Procedure Laterality Date  . APPENDECTOMY  1990  . BREAST ENHANCEMENT SURGERY    . LUMBAR FUSION  03/21/2007  . SMALL INTESTINE SURGERY  1990  . 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 TOTAL HIP ARTHROPLASTY     Left- Dr.Moore  . TUBAL LIGATION  2004    There were no vitals filed for this visit.    Subjective Assessment - 12/28/19 1406    Subjective Pt reports R shoulder pain since May 2020. Treated with steroid injections and exercises from sports med MD with some relief after 1st injection but then pain seemed to worsen and no relief from 2nd injection.    Limitations House hold activities    Diagnostic tests 08/17/19 - [place] R shoulder: Mild chronic changes appreciated of the rotator cuff.  07/23/19 - L shoulder x-ray: Negative.    Patient Stated Goals "to be out of pain and strengthen my arm"    Currently in Pain? Yes    Pain Score 7    ranges from 5-10/10   Pain Location Shoulder    Pain Orientation Right    Pain Descriptors / Indicators Aching    Pain Type Acute pain    Pain Radiating Towards intermittent burning and tingling down R arm to wrist    Pain Onset  Other (comment)   May 2020   Pain Frequency Constant   varies in intensity   Aggravating Factors  unpredictable    Pain Relieving Factors Norco/Vicoden, Tylenol Extra Strength, temp relief from BioFreeeze    Effect of Pain on Daily Activities arms shakes when lifting something in R hand, difficulty bathign or washing hair              [facility] PT Assessment - 12/28/19 1402      Assessment   Medical Diagnosis R shoulder adhesive capsulitis    Referring Provider (PT) Clearance Coots, MD    Onset Date/Surgical Date 07/23/19    Hand Dominance Right    Next MD Visit 02/15/20    Prior Therapy PT s/p 5 THA (4 on R, 1 on L) & lumbar fusion      Precautions   Precautions None      Restrictions   Weight Bearing Restrictions No      Balance Screen   Has the patient fallen in the past 6 months No    Has the patient had a decrease in activity level because of a fear of falling?  No    Is the patient reluctant to leave their  home because of a fear of falling?  No      Home Environment   Living Environment Private residence    Living Arrangements Spouse/significant other    Type of [place] Access Level entry    Home Layout One level      Prior Function   Level of [place] Retired    Leisure watch her grandchildren, reading, cooking      Cognition   Overall Cognitive Status Within Functional Limits for tasks assessed      Observation/Other Assessments   Focus on Therapeutic Outcomes (FOTO)  Shoulder - 45% (55% limitation); Predicted 61% (39% limitation)      Posture/Postural Control   Posture/Postural Control Postural limitations    Postural Limitations Rounded Shoulders    Posture Comments R scapula mildy protracted      ROM / Strength   AROM / PROM / Strength AROM;PROM;Strength      AROM   AROM Assessment Site Shoulder    Right/Left Shoulder Right;Left    Right Shoulder Flexion 98 Degrees    Right Shoulder ABduction 79 Degrees     Right Shoulder Internal Rotation 48 Degrees   FIR to L3   Right Shoulder External Rotation 60 Degrees   FER to top of shoulder   Left Shoulder Flexion 142 Degrees    Left Shoulder ABduction 125 Degrees    Left Shoulder Internal Rotation --   FIR to T11   Left Shoulder External Rotation --   FER to spine of scapula     PROM   PROM Assessment Site Shoulder    Right/Left Shoulder Right    Right Shoulder Flexion 125 Degrees    Right Shoulder ABduction 92 Degrees    Right Shoulder Internal Rotation 62 Degrees    Right Shoulder External Rotation 71 Degrees      Strength   Strength Assessment Site Shoulder    Right/Left Shoulder Right;Left    Right Shoulder Flexion 3-/5    Right Shoulder ABduction 2+/5    Right Shoulder Internal Rotation 3-/5    Right Shoulder External Rotation 3-/5    Left Shoulder Flexion 4/5    Left Shoulder ABduction 4/5    Left Shoulder Internal Rotation 4/5    Left Shoulder External Rotation 4/5      Palpation   Palpation comment ttp t/o R shoulder complex esp UT, LS and RTC                      Objective measurements completed on examination: See above findings.       [place] Adult PT Treatment/Exercise - 12/28/19 1402      Exercises   Exercises Shoulder      Shoulder Exercises: Seated   Retraction Both;5 reps;AROM;Strengthening      Shoulder Exercises: ROM/Strengthening   Pendulum R shoulder fwd/back (1#), side/side (no weight), CW/CCW (1#)      Neck Exercises: Stretches   Upper Trapezius Stretch Right;30 seconds;1 rep    Upper Trapezius Stretch Limitations hands in lap    Levator Stretch Right;30 seconds;1 rep    Levator Stretch Limitations hands in lap                  PT Education - 12/28/19 1454    Person(s) Educated Patient    Methods Explanation;Demonstration;Verbal cues;Handout    Comprehension Verbalized understanding;Verbal cues required;Returned demonstration;Need further instruction  PT Short  Term Goals - 12/28/19 1454      PT SHORT TERM GOAL #1   Title Patient will be independent with initial HEP    Status New    Target Date 01/18/20      PT SHORT TERM GOAL #2   Title Patient to demonstrate appropriate posture and body mechanics needed for daily activities    Status New    Target Date 01/25/20             PT Long Term Goals - 12/28/19 1518      PT LONG TERM GOAL #1   Title Patient will be independent with ongoing/advanced HEP    Status New    Target Date 02/22/20      PT LONG TERM GOAL #2   Title Patient to report R shoulder pain reduction in frequency and intensity by >/= 50%    Status New    Target Date 02/22/20      PT LONG TERM GOAL #3   Title Patient to improve R shoulder AROM to [facility] without pain provocation    Status New    Target Date 02/22/20      PT LONG TERM GOAL #4   Title Patient will demonstrate improved R shoulder strength to >/= 4/5 for functional UE use    Status New    Target Date 02/22/20      PT LONG TERM GOAL #5   Title Patient to report ability to perform ADLs, household, and work-related tasks without limitation due to R shoulder pain, LOM or weakness    Status New    Target Date 02/22/20                  Plan - 12/28/19 1454    Clinical Impression Statement Chelan is a 64 y/o female who presents to OP PT for R shoulder pain secondary to adhesive capsulitis. Pain originated in May 2020 w/o known trigger/MOI and has only limited response to meds and injections. Deficits include forward rounded shoulder posture with protracted R scapula; increased muscle tension throughout posterior RTC musculature, UT/LS, deltoids, lateral pecs and biceps; significantly limited AROM and PROM of R shoulder; and pronounced R shoulder/RTC weakness.  Pain and limited ROM create difficulty completing self-care including bathing and grooming and cause her arm to shake when attempting to lift anything. Marciana will benefit from skilled PT to address above  deficits and restore functional ROM and strength to allow her to resume normal daily activities without pain interference.    Personal Factors and Comorbidities Comorbidity 3+;Time since onset of injury/illness/exacerbation;Past/Current Experience;Fitness;Age    Comorbidities B THA (R x 4, L x 1), lumbar fusion, depression, breast enhancement surgery    Examination-Activity Limitations Bathing;Bed Mobility;Caring for Others;Carry;Dressing;Hygiene/Grooming;Lift;Reach Overhead;Self Feeding;Sleep;Toileting    Examination-Participation Restrictions Cleaning;Community Activity;Driving;Laundry;Meal Prep;Shop    Stability/Clinical Decision Making Stable/Uncomplicated    Clinical Decision Making Low    Rehab Potential Good    PT Frequency 2x / week   Pt requesting to try 1x/wk initially   PT Duration 8 weeks    PT Treatment/Interventions ADLs/Self Care Home Management;Cryotherapy;Electrical Stimulation;Iontophoresis 4mg /ml Dexamethasone;Moist Heat;Ultrasound;Functional mobility training;Therapeutic activities;Therapeutic exercise;Neuromuscular re-education;Patient/family education;Manual techniques;Passive range of motion;Dry needling;Taping;Vasopneumatic Device;Joint Manipulations    PT Next Visit Plan Review initial HEP; R shoulder AAROM; postural strengthening/stabilization; manual therapy and modalities PRN    PT Home Exercise Plan 10/25 - pendulums +/- 1# weight, UT & LS stretches, scap retraction    Consulted and Agree with Plan of Care  Patient           Patient will benefit from skilled therapeutic intervention in order to improve the following deficits and impairments:  Decreased activity tolerance, Decreased range of motion, Decreased strength, Increased fascial restricitons, Increased muscle spasms, Impaired flexibility, Impaired UE functional use, Improper body mechanics, Postural dysfunction, Pain  Visit Diagnosis: Acute pain of right shoulder  Stiffness of right shoulder, not elsewhere  classified  Abnormal posture  Muscle weakness (generalized)  Other symptoms and signs involving the musculoskeletal system     Problem List Patient Active Problem List   Diagnosis Date Noted  . Subacromial bursitis of right shoulder joint 11/10/2019  . Adhesive capsulitis of right shoulder 08/17/2019  . Hyperlipidemia 04/21/2018  . DEGENERATIVE JOINT DISEASE, HIPS 08/17/2009  . HIP PAIN, BILATERAL 08/02/2009  . DEPRESSION 07/06/2008  . CALLUS, RIGHT FOOT 06/05/2007  . SYNOVIAL CYST 06/05/2007  . ELECTROCARDIOGRAM, ABNORMAL 06/05/2007    Percival Spanish 12/28/2019, 3:25 PM  [facility] [address]  [place], [place], [address] Phone: [phone]   Fax:  [phone]  Name: JAELYN BOURGOIN MRN: [id number] Date of Birth: 03/19/1955

## 2019-12-29 DIAGNOSIS — L720 Epidermal cyst: Secondary | ICD-10-CM | POA: Diagnosis not present

## 2019-12-29 DIAGNOSIS — D485 Neoplasm of uncertain behavior of skin: Secondary | ICD-10-CM | POA: Diagnosis not present

## 2020-01-05 DIAGNOSIS — L720 Epidermal cyst: Secondary | ICD-10-CM | POA: Diagnosis not present

## 2020-01-06 ENCOUNTER — Ambulatory Visit: Payer: Medicare Other | Attending: Family Medicine | Admitting: Physical Therapy

## 2020-01-06 ENCOUNTER — Other Ambulatory Visit: Payer: Self-pay

## 2020-01-06 ENCOUNTER — Encounter: Payer: Self-pay | Admitting: Family Medicine

## 2020-01-06 ENCOUNTER — Encounter: Payer: Self-pay | Admitting: Physical Therapy

## 2020-01-06 ENCOUNTER — Telehealth: Payer: Self-pay

## 2020-01-06 DIAGNOSIS — M25511 Pain in right shoulder: Secondary | ICD-10-CM | POA: Insufficient documentation

## 2020-01-06 DIAGNOSIS — R29898 Other symptoms and signs involving the musculoskeletal system: Secondary | ICD-10-CM | POA: Diagnosis not present

## 2020-01-06 DIAGNOSIS — R293 Abnormal posture: Secondary | ICD-10-CM | POA: Diagnosis not present

## 2020-01-06 DIAGNOSIS — M25611 Stiffness of right shoulder, not elsewhere classified: Secondary | ICD-10-CM | POA: Insufficient documentation

## 2020-01-06 DIAGNOSIS — M6281 Muscle weakness (generalized): Secondary | ICD-10-CM

## 2020-01-06 NOTE — Patient Instructions (Signed)
    Home exercise program created by Shetara Launer, PT.  For questions, please contact Michah Minton via phone at [phone] or email at [email or web address]  [facility] [place] [address]  [address] [place], [place], [address] Phone: [phone]   Fax:  [phone]    

## 2020-01-06 NOTE — Telephone Encounter (Signed)
Patient dropped off [place] handicap renewal placard for completion.  Form placed in provider's box for completion.  Patient also brought a self addressed stamped envelope for completed form to be mailed back to her.  Envelope is attached to form.

## 2020-01-06 NOTE — Therapy (Addendum)
[place] [address]  [place], [place], [address] Phone: [phone]   Fax:  [phone]  Physical Therapy Treatment / Discharge Summary  Patient Details  Name: Ashley Salinas MRN: [id number] Date of Birth: [birth year] Referring Provider (PT): Clearance Coots, MD   Encounter Date: 01/06/2020   PT End of Session - 01/06/20 1105    Visit Number 2    Number of Visits 16    Date for PT Re-Evaluation 02/22/20    Authorization Type UHC Medicare    PT Start Time 1105    PT Stop Time 1149    PT Time Calculation (min) 44 min    Activity Tolerance Patient tolerated treatment well;Patient limited by pain    Behavior During Therapy [facility] for tasks assessed/performed           Past Medical History:  Diagnosis Date  . Depression     Past Surgical History:  Procedure Laterality Date  . APPENDECTOMY  1990  . BREAST ENHANCEMENT SURGERY    . LUMBAR FUSION  03/21/2007  . SMALL INTESTINE SURGERY  1990  . 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 TOTAL HIP ARTHROPLASTY     Left- Dr.Moore  . TUBAL LIGATION  2004    There were no vitals filed for this visit.   Subjective Assessment - 01/06/20 1107    Subjective Pt reports pain has been pretty constant lately. Pt reports she has been doing HEP 2-3x/day to the best of her ability but notes some increased pain after exercising.    Diagnostic tests 08/17/19 - [place] R shoulder: Mild chronic changes appreciated of the rotator cuff.  07/23/19 - L shoulder x-ray: Negative.    Patient Stated Goals "to be out of pain and strengthen my arm"    Currently in Pain? Yes    Pain Score 8     Pain Location Shoulder    Pain Orientation Right    Pain Descriptors / Indicators Constant;Sharp    Pain Type Acute pain    Pain Frequency Constant                             [facility] Adult PT Treatment/Exercise - 01/06/20 1105      Exercises    Exercises Shoulder      Shoulder Exercises: Supine   External Rotation Right;AAROM;10 reps    External Rotation Limitations wand - arm ~30-45 ABD and elevated on towel roll    Flexion Right;AAROM;10 reps    Flexion Limitations wand    ABduction Right;AAROM;10 reps    ABduction Limitations wand scaption      Shoulder Exercises: Seated   Retraction Both;10 reps;AROM;Strengthening    Flexion Right;10 reps;AAROM;PROM    Flexion Limitations table slides    Abduction Right;10 reps;AAROM;PROM    ABduction Limitations table slides    Other Seated Exercises R shoulder scaption table slides x 10      Shoulder Exercises: Pulleys   Flexion 3 minutes    Flexion Limitations within painfree ROM    Scaption 3 minutes    Scaption Limitations within painfree ROM      Shoulder Exercises: ROM/Strengthening   Pendulum R shoulder fwd/back. 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;2 reps    Corner Stretch Limitations B & R only low doorway pec stretch  Neck Exercises: Stretches   Upper Trapezius Stretch Right;30 seconds;2 reps    Upper Trapezius Stretch Limitations hands in lap    Levator Stretch Right;30 seconds;2 reps    Levator Stretch Limitations hands in lap                  PT Education - 01/06/20 1147    Education Details HEP update - R shoulder P/AAROM - table slides & wand AAROM    Person(s) Educated Patient    Methods Explanation;Demonstration;Verbal cues;Tactile cues;Handout    Comprehension Verbalized understanding;Verbal cues required;Tactile cues required;Returned demonstration;Need further instruction            PT Short Term Goals - 01/06/20 1111      PT SHORT TERM GOAL #1   Title Patient will be independent with initial HEP    Status On-going    Target Date 01/18/20      PT SHORT TERM GOAL #2   Title Patient to demonstrate appropriate posture and body mechanics needed for daily activities    Status  On-going    Target Date 01/25/20             PT Long Term Goals - 01/06/20 1111      PT LONG TERM GOAL #1   Title Patient will be independent with ongoing/advanced HEP    Status On-going    Target Date 02/22/20      PT LONG TERM GOAL #2   Title Patient to report R shoulder pain reduction in frequency and intensity by >/= 50%    Status On-going    Target Date 02/22/20      PT LONG TERM GOAL #3   Title Patient to improve R shoulder AROM to [facility] without pain provocation    Status On-going    Target Date 02/22/20      PT LONG TERM GOAL #4   Title Patient will demonstrate improved R shoulder strength to >/= 4/5 for functional UE use    Status On-going    Target Date 02/22/20      PT LONG TERM GOAL #5   Title Patient to report ability to perform ADLs, household, and work-related tasks without limitation due to R shoulder pain, LOM or weakness    Status On-going    Target Date 02/22/20                 Plan - 01/06/20 1112    Clinical Impression Statement Ashley Salinas reports pain remains constant and seems to be aggravated following HEP performance. HEP review revealing more aggressive swing with active muscle activation during pendulum exercises - instructions provided clarifying use of body weight shifting momentum to create PROM. Introduced self P/AAROM with table slides and wand exercises instructing pt to work through movements to point of gentle stretch but to avoid pushing too far into painful ROM - updated HEP provided as pt will be unable to return to PT for 2 weeks.    Comorbidities B THA (R x 4, L x 1), lumbar fusion, depression, breast enhancement surgery    Rehab Potential Good    PT Frequency 2x / week   Pt requesting to try 1x/wk initially   PT Duration 8 weeks    PT Treatment/Interventions ADLs/Self Care Home Management;Cryotherapy;Electrical Stimulation;Iontophoresis 57m/ml Dexamethasone;Moist Heat;Ultrasound;Functional mobility training;Therapeutic  activities;Therapeutic exercise;Neuromuscular re-education;Patient/family education;Manual techniques;Passive range of motion;Dry needling;Taping;Vasopneumatic Device;Joint Manipulations    PT Next Visit Plan Review HEP PRN; R shoulder AAROM progressing to AROM as pain and muscle control allows;  postural strengthening/stabilization; manual therapy and modalities PRN    PT Home Exercise Plan 10/25 - pendulums +/- 1# weight, UT & LS stretches, scap retraction; 11/3 - R shoulder P/AAROM - table slides & wand AAROM    Consulted and Agree with Plan of Care Patient           Patient will benefit from skilled therapeutic intervention in order to improve the following deficits and impairments:  Decreased activity tolerance, Decreased range of motion, Decreased strength, Increased fascial restricitons, Increased muscle spasms, Impaired flexibility, Impaired UE functional use, Improper body mechanics, Postural dysfunction, Pain  Visit Diagnosis: Acute pain of right shoulder  Stiffness of right shoulder, not elsewhere classified  Abnormal posture  Muscle weakness (generalized)  Other symptoms and signs involving the musculoskeletal system     Problem List Patient Active Problem List   Diagnosis Date Noted  . Subacromial bursitis of right shoulder joint 11/10/2019  . Adhesive capsulitis of right shoulder 08/17/2019  . Hyperlipidemia 04/21/2018  . DEGENERATIVE JOINT DISEASE, HIPS 08/17/2009  . HIP PAIN, BILATERAL 08/02/2009  . DEPRESSION 07/06/2008  . CALLUS, RIGHT FOOT 06/05/2007  . SYNOVIAL CYST 06/05/2007  . ELECTROCARDIOGRAM, ABNORMAL 06/05/2007    Percival Spanish, PT, MPT 01/06/2020, 2:22 PM  [facility] [address]  [place], [place], [address] Phone: [phone]   Fax:  [phone]  Name: Ashley Salinas MRN: [id number] Date of Birth: [birth year]   PHYSICAL THERAPY DISCHARGE SUMMARY  Visits from Start of Care:  2  Current functional level related to goals / functional outcomes:   Refer to above clinical impression for status as of last visit on 01/07/2020. Patient cancelled all remaining scheduled visits and did not reschedule in >30 days, therefore will proceed with discharge from PT.   Remaining deficits:   As above. Unable to formally assess status at D/C due to failure to return to PT.   Education / Equipment:   Initial HEP  Plan: Patient agrees to discharge.  Patient goals were not met. Patient is being discharged due to not returning since the last visit.  ?????     Percival Spanish, PT, MPT 03/01/20, 10:54 AM  [facility] [place], [place], [address] Phone: [phone]   Fax:  [phone]

## 2020-01-08 NOTE — Telephone Encounter (Signed)
Form completed and placed in mail

## 2020-01-19 DIAGNOSIS — L720 Epidermal cyst: Secondary | ICD-10-CM | POA: Diagnosis not present

## 2020-02-01 ENCOUNTER — Telehealth: Payer: Self-pay

## 2020-02-01 NOTE — Telephone Encounter (Signed)
Received [place] placard renewal form from patient via [place] along with a SASE to be returned to patient in once provider has completed the form.  Form has been placed in provider's box for pick up and completion.

## 2020-02-02 NOTE — Telephone Encounter (Signed)
Received. Placed in folder for [employer]

## 2020-02-02 NOTE — Telephone Encounter (Signed)
Form received back from [facility]. Form placed in mail with envelope provided by patient. Copy sent to scan

## 2020-02-10 ENCOUNTER — Encounter: Payer: Self-pay | Admitting: Family Medicine

## 2020-02-15 ENCOUNTER — Ambulatory Visit (INDEPENDENT_AMBULATORY_CARE_PROVIDER_SITE_OTHER): Payer: Medicare Other | Admitting: Family Medicine

## 2020-02-15 ENCOUNTER — Encounter: Payer: Self-pay | Admitting: Family Medicine

## 2020-02-15 ENCOUNTER — Other Ambulatory Visit: Payer: Self-pay

## 2020-02-15 DIAGNOSIS — M65811 Other synovitis and tenosynovitis, right shoulder: Secondary | ICD-10-CM | POA: Diagnosis not present

## 2020-02-15 NOTE — Assessment & Plan Note (Addendum)
Worsening of her pain. No improvement with PT. Did get some improveement with prednisone. Possibly more of a synovitis.  -Counseled on home exercise therapy and supportive therapy -Provided Rayos samples. -ANA, CRP, sed rate and uric acid. -If lab work is unrevealing may need to consider MRI

## 2020-02-15 NOTE — Patient Instructions (Signed)
Good to see you Please try the rayos as close to 10 pm.  I will call with the results.   Please send me a message in MyChart with any questions or updates.  Follow up will depend on the lab results.   --Dr. Raeford Razor

## 2020-02-15 NOTE — Progress Notes (Signed)
Medication Samples have been provided to the patient.  Drug name: Rayos       Strength: 5mg         Qty: 2 boxes  LOT: [id number]  Exp.Date: 09/2020  Dosing instructions: Take 1 tablet by mouth as close to 10 pm as possible.  The patient has been instructed regarding the correct time, dose, and frequency of taking this medication, including desired effects and most common side effects.   Sherrie George, [place] 12:06 PM 02/15/2020

## 2020-02-15 NOTE — Progress Notes (Signed)
Ashley Salinas - 64 y.o. female MRN [id number]  Date of birth: 10-Aug-1955  SUBJECTIVE:  Including CC & ROS.  Chief Complaint  Patient presents with  . Follow-up    Right shoulder    Ashley Salinas is a 64 y.o. female that is presenting with worsening of her right shoulder pain.  We have tried a subacromial injection as well as a glenohumeral injection.  She gets some improvement with these injections.  She also got some improvement with the prednisone.  Physical therapy seemed to exacerbate her pain.  The pain is most severe at the shoulder but does get radiation distally when she is trying to expand her range of motion.   Review of Systems See HPI   HISTORY: Past Medical, Surgical, Social, and Family History Reviewed & Updated per EMR.   Pertinent Historical Findings include:  Past Medical History:  Diagnosis Date  . Depression     Past Surgical History:  Procedure Laterality Date  . APPENDECTOMY  1990  . BREAST ENHANCEMENT SURGERY    . LUMBAR FUSION  03/21/2007  . SMALL INTESTINE SURGERY  1990  . 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 TOTAL HIP ARTHROPLASTY     Left- Dr.Moore  . TUBAL LIGATION  2004    Family History  Problem Relation Age of Onset  . Breast cancer Mother   . Cancer Mother 18       breast  . Arthritis Father   . Cancer Father        multiple myeloma  stage 3B    Social History   Socioeconomic History  . Marital status: Widowed    Spouse name: Not on file  . Number of children: Not on file  . Years of education: Not on file  . Highest education level: Not on file  Occupational History    Comment: unemployed-- looking for another job  Tobacco Use  . Smoking status: Current Some Day Smoker    Packs/day: 0.50    Years: 35.00    Pack years: 17.50    Types: Cigarettes    Last attempt to quit: 10/04/2010    Years since quitting: 9.3  . Smokeless tobacco: Never Used  . Tobacco comment: smoking on and off-- few cig  here and there  Substance and Sexual Activity  . Alcohol use: Yes    Alcohol/week: 1.0 standard drink    Types: 1 Glasses of wine per week    Comment: rare-- actually < 1x   . Drug use: No  . Sexual activity: Yes    Partners: Male  Other Topics Concern  . Not on file  Social History Narrative   Exercise-  no   Social Determinants of Health   Financial Resource Strain: Low Risk   . Difficulty of Paying Living Expenses: Not hard at all  Food Insecurity: Not on file  Transportation Needs: No Transportation Needs  . Lack of Transportation (Medical): No  . Lack of Transportation (Non-Medical): No  Physical Activity: Not on file  Stress: Not on file  Social Connections: Not on file  Intimate Partner Violence: Not on file     PHYSICAL EXAM:  VS: BP (!) 151/89   Pulse 75   Ht '5\' 6"'  (1.676 m)   Wt 158 lb (71.7 kg)   BMI 25.50 kg/m  Physical Exam Gen: NAD, alert, cooperative with exam, well-appearing MSK:  Right shoulder: Limited external rotation. Limited abduction and flexion. Normal internal  rotation. Normal grip strength. Neurovascular intact     ASSESSMENT & PLAN:   Synovitis of right shoulder Worsening of her pain. No improvement with PT. Did get some improveement with prednisone. Possibly more of a synovitis.  -Counseled on home exercise therapy and supportive therapy -Provided Rayos samples. -ANA, CRP, sed rate and uric acid. -If lab work is unrevealing may need to consider MRI

## 2020-02-16 ENCOUNTER — Ambulatory Visit (HOSPITAL_BASED_OUTPATIENT_CLINIC_OR_DEPARTMENT_OTHER)
Admission: RE | Admit: 2020-02-16 | Discharge: 2020-02-16 | Disposition: A | Discharge status: Home / Self Care | Payer: Medicare Other | Source: Ambulatory Visit | Attending: Family Medicine | Admitting: Family Medicine

## 2020-02-16 ENCOUNTER — Encounter: Payer: Self-pay | Admitting: Family Medicine

## 2020-02-16 ENCOUNTER — Other Ambulatory Visit: Payer: Self-pay

## 2020-02-16 ENCOUNTER — Ambulatory Visit (INDEPENDENT_AMBULATORY_CARE_PROVIDER_SITE_OTHER): Payer: Medicare Other | Admitting: Family Medicine

## 2020-02-16 VITALS — BP 142/90 | HR 82 | Temp 98.1°F | Resp 18 | Ht 66.0 in | Wt 163.8 lb

## 2020-02-16 DIAGNOSIS — M65811 Other synovitis and tenosynovitis, right shoulder: Secondary | ICD-10-CM | POA: Diagnosis not present

## 2020-02-16 DIAGNOSIS — G8929 Other chronic pain: Secondary | ICD-10-CM | POA: Insufficient documentation

## 2020-02-16 DIAGNOSIS — M545 Low back pain, unspecified: Secondary | ICD-10-CM | POA: Insufficient documentation

## 2020-02-16 NOTE — Patient Instructions (Signed)
Tenosynovitis  Tenosynovitis is inflammation of a tendon and of the sleeve of tissue that covers the tendon (tendon sheath). A tendon is a cord of tissue that connects muscle to bone. Normally, a tendon slides smoothly inside its tendon sheath. Tenosynovitis limits movement of the tendon and surrounding tissues, which may cause pain and stiffness. Tenosynovitis can affect any tendon and tendon sheath. Commonly affected areas include tendons in the:  Wrist.  Arm.  Hand.  Hip.  Leg.  Foot.  Shoulder. What are the causes? The main cause of this condition is wear and tear over time that results in slight tears in the tendon. Other possible causes include:  An injury to the tendon or tendon sheath.  A disease that causes inflammation in the body.  An infection that spreads to the tendon and tendon sheath from a skin wound.  An infection in another part of the body that spreads to the tendon and tendon sheath through the blood. What increases the risk? The following factors may make you more likely to develop this condition:  Having rheumatoid arthritis, gout, or diabetes.  Using IV drugs.  Doing physical activities that can cause tendon overuse and stress.  Having gonorrhea. What are the signs or symptoms? Symptoms of this condition depend on the cause. Symptoms may include:  Pain with movement.  Pain when pressing on the tendon and tendon sheath.  Swelling.  Stiffness. If tenosynovitis is caused by an infection, symptoms may include:  Fever.  Redness.  Warmth. How is this diagnosed? This condition may be diagnosed based on your medical history and a physical exam. You also may have:  Blood tests.  Imaging tests, such as: ? MRI. ? Ultrasound.  A sample of fluid removed from inside the tendon sheath to be checked in a lab. How is this treated? Treatment for this condition depends on the cause. If tenosynovitis is not caused by an infection, treatment may  include:  Rest.  Keeping the tendon in place (immobilization) in a splint, brace, or sling.  Taking NSAIDs to reduce pain and swelling.  A shot (injection) of medicine to help reduce pain and swelling (steroid).  Icing or applying heat to the affected area.  Physical therapy.  Surgery to release the tendon in the sheath or to repair damage to the tendon or tendon sheath. Surgery may be done if other treatments do not help relieve symptoms. If tenosynovitis is caused by infection, treatment may include antibiotic medicine given through an IV. In some cases, surgery may be needed to drain fluid from the tendon sheath or to remove the tendon sheath. Follow these instructions at home: If you have a splint, brace, or sling:   Wear the splint, brace, or sling as told by your health care provider. Remove it only as told by your health care provider.  Loosen the splint, brace, or sling if your fingers or toes tingle, become numb, or turn cold and blue.  Keep the splint, brace, or sling clean.  If the splint, brace, or sling is not waterproof: ? Do not let it get wet. ? Cover it with a watertight covering when you take a bath or shower. Managing pain, stiffness, and swelling   If directed, put ice on the affected area. ? Put ice in a plastic bag. ? Place a towel between your skin and the bag. ? Leave the ice on for 20 minutes, 2-3 times a day.  Move the fingers or toes of the affected limb often, if  this applies. This can help to reduce stiffness and swelling.  If directed, raise (elevate) the affected area above the level of your heart while you are sitting or lying down.  If directed, apply heat to the affected area before you exercise. Use the heat source that your health care provider recommends, such as a moist heat pack or a heating pad. ? Place a towel between your skin and the heat source. ? Leave the heat on for 20-30 minutes. ? Remove the heat if your skin turns bright  red. This is especially important if you are unable to feel pain, heat, or cold. You may have a greater risk of getting burned. Medicines  Take over-the-counter and prescription medicines only as told by your health care provider.  Ask your health care provider if the medicine prescribed to you: ? Requires you to avoid driving or using heavy machinery. ? Can cause constipation. You may need to take actions to prevent or treat constipation, such as:  Drink enough fluid to keep your urine pale yellow.  Take over-the-counter or prescription medicines.  Eat foods that are high in fiber, such as beans, whole grains, and fresh fruits and vegetables.  Limit foods that are high in fat and processed sugars, such as fried or sweet foods. Activity  Return to your normal activities as told by your health care provider. Ask your health care provider what activities are safe for you.  Rest the affected area as told by your health care provider.  Avoid using the affected area while you are having symptoms.  Do not use the injured limb to support your body weight until your health care provider says that you can.  If physical therapy was prescribed, do exercises as told by your health care provider. General instructions  Ask your health care provider when it is safe to drive if you have a splint or brace on any part of your arm or leg.  Keep all follow-up visits as told by your health care provider. This is important. Contact a health care provider if:  Your symptoms are not improving or are getting worse. Get help right away if:  Your fingers or toes become numb or turn blue.  You have a fever and more of any of the following symptoms: ? Pain. ? Redness. ? Warmth. ? Swelling. Summary  Tenosynovitis is inflammation of a tendon and of the sleeve of tissue that covers the tendon (tendon sheath).  Treatment for this condition depends on the cause. Treatment may include rest, medicines,  physical therapy, or surgery.  Contact a health care provider if your symptoms are not improving or are getting worse.  Keep all follow-up visits as told by your health care provider. This is important. This information is not intended to replace advice given to you by your health care provider. Make sure you discuss any questions you have with your health care provider. Document Revised: 10/10/2017 Document Reviewed: 10/10/2017 Elsevier Patient Education  [place].

## 2020-02-16 NOTE — Progress Notes (Signed)
Patient ID: Ashley Salinas, female    DOB: 11/11/1955  Age: 64 y.o. MRN: [id number]    Subjective:  Subjective  HPI Ashley Salinas presents for f/u sport med---   She is requesting blood work be done  She is not better and the should pain has been going on since last summer.    Review of Systems  Constitutional: Negative for appetite change, diaphoresis, fatigue and unexpected weight change.  Eyes: Negative for pain, redness and visual disturbance.  Respiratory: Negative for cough, chest tightness, shortness of breath and wheezing.   Cardiovascular: Negative for chest pain, palpitations and leg swelling.  Endocrine: Negative for cold intolerance, heat intolerance, polydipsia, polyphagia and polyuria.  Genitourinary: Negative for difficulty urinating, dysuria and frequency.  Musculoskeletal: Positive for arthralgias.  Neurological: Negative for dizziness, light-headedness, numbness and headaches.    History Past Medical History:  Diagnosis Date  . 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   Her family history includes Arthritis in her father; Breast cancer in her mother; Cancer in her father; Cancer (age of onset: 78) in her mother.She reports that she has been smoking cigarettes. She has a 17.50 pack-year smoking history. She has never used smokeless tobacco. She reports current alcohol use of about 1.0 standard drink of alcohol per week. She reports that she does not use drugs.  Current Outpatient Medications on File Prior to Visit  Medication Sig Dispense Refill  . Multiple Vitamin (MULTIVITAMIN) capsule Take 1 capsule by mouth daily.    . predniSONE (RAYOS) 5 MG TBEC Take by mouth. 30 tablet    No current facility-administered medications on file prior to visit.      Objective:  Objective  Physical Exam Vitals and nursing note reviewed.  Constitutional:      Appearance: She is well-developed and well-nourished.  HENT:     Head: Normocephalic and atraumatic.  Eyes:     Extraocular Movements: EOM normal.     Conjunctiva/sclera: Conjunctivae normal.  Neck:     Thyroid: No thyromegaly.     Vascular: No carotid bruit or JVD.  Cardiovascular:     Rate and Rhythm: Normal rate and regular rhythm.     Heart sounds: Normal heart sounds. No murmur heard.   Pulmonary:     Effort: Pulmonary effort is normal. No respiratory distress.     Breath sounds: Normal breath sounds. No wheezing or rales.  Chest:     Chest wall: No tenderness.  Musculoskeletal:        General: Tenderness present. No edema.     Cervical back: Normal range of motion and neck supple.  Neurological:     Mental Status: She is alert and oriented to person, place, and time.  Psychiatric:        Mood and Affect: Mood and affect normal.    BP (!) 142/90 (BP Location: Left Arm, Patient Position: Sitting, Cuff Size: Normal)   Pulse 82   Temp 98.1 F (36.7 C) (Oral)   Resp 18   Ht 5\' 6"  (1.676 m)   Wt 163 lb 12.8 oz (74.3 kg)   SpO2 99%   BMI 26.44 kg/m  Wt Readings from Last 3 Encounters:  02/16/20 163 lb 12.8 oz (74.3 kg)  02/15/20 158 lb (71.7 kg)  12/15/19 155 lb (70.3 kg)  Lab Results  Component Value Date   WBC 6.2 05/11/2019   HGB 14.1 05/11/2019   HCT 41.1 05/11/2019   PLT 225.0 05/11/2019   GLUCOSE 82 05/11/2019   CHOL 200 05/11/2019   TRIG 129.0 05/11/2019   HDL 70.10 05/11/2019   LDLDIRECT 104.0 04/21/2018   LDLCALC 104 (H) 05/11/2019   ALT 19 05/11/2019   AST 23 05/11/2019   NA 138 05/11/2019   K 4.2 05/11/2019   CL 105 05/11/2019   CREATININE 0.91 05/11/2019   BUN 23 05/11/2019   CO2 24 05/11/2019   TSH 1.49 05/11/2019    No results found.   Assessment & Plan:  Plan  I have discontinued Ashley Salinas's predniSONE and  HYDROcodone-acetaminophen. I am also having her maintain her multivitamin and Rayos.  No orders of the defined types were placed in this encounter.   Problem List Items Addressed This Visit      Unprioritized   Chronic midline low back pain without sciatica   Relevant Medications   predniSONE (RAYOS) 5 MG TBEC   Other Relevant Orders   DG Lumbar Spine Complete   Synovitis of right shoulder - Primary   Relevant Medications   predniSONE (RAYOS) 5 MG TBEC   Other Relevant Orders   Antinuclear Antib (ANA)   CRP High sensitivity   Sedimentation rate   Uric acid   MR Shoulder Right Wo Contrast    pt is on rayos fron sport med She is requesting blood work and potential rheum referral   Follow-up: Return if symptoms worsen or fail to improve.  [name], DO

## 2020-02-17 ENCOUNTER — Telehealth: Payer: Self-pay | Admitting: Family Medicine

## 2020-02-17 LAB — SEDIMENTATION RATE: Sed Rate: 20 mm/hr (ref 0–30)

## 2020-02-17 LAB — URIC ACID: Uric Acid, Serum: 5.6 mg/dL (ref 2.4–7.0)

## 2020-02-17 LAB — HIGH SENSITIVITY CRP: CRP, High Sensitivity: 0.96 mg/L (ref 0.000–5.000)

## 2020-02-17 LAB — ANA: Anti Nuclear Antibody (ANA): NEGATIVE

## 2020-02-17 NOTE — Telephone Encounter (Signed)
Left msg for pt to call back regarding facility. [facility] can schedule pt sooner than [facility] or [facility].

## 2020-02-20 ENCOUNTER — Ambulatory Visit (HOSPITAL_BASED_OUTPATIENT_CLINIC_OR_DEPARTMENT_OTHER)
Admission: RE | Admit: 2020-02-20 | Discharge: 2020-02-20 | Disposition: A | Discharge status: Home / Self Care | Payer: Medicare Other | Source: Ambulatory Visit | Attending: Family Medicine | Admitting: Family Medicine

## 2020-02-20 ENCOUNTER — Other Ambulatory Visit: Payer: Self-pay

## 2020-02-20 DIAGNOSIS — M65811 Other synovitis and tenosynovitis, right shoulder: Secondary | ICD-10-CM | POA: Diagnosis not present

## 2020-02-20 DIAGNOSIS — M25511 Pain in right shoulder: Secondary | ICD-10-CM | POA: Diagnosis not present

## 2020-02-22 ENCOUNTER — Other Ambulatory Visit: Payer: Self-pay | Admitting: Family Medicine

## 2020-02-22 ENCOUNTER — Other Ambulatory Visit: Payer: Self-pay

## 2020-02-22 DIAGNOSIS — M719 Bursopathy, unspecified: Secondary | ICD-10-CM

## 2020-02-22 DIAGNOSIS — G8929 Other chronic pain: Secondary | ICD-10-CM

## 2020-02-22 DIAGNOSIS — M1990 Unspecified osteoarthritis, unspecified site: Secondary | ICD-10-CM

## 2020-02-22 MED ORDER — PREDNISONE 10 MG PO TABS: ORAL_TABLET | ORAL | 0 refills | Status: DC

## 2020-02-22 NOTE — Telephone Encounter (Signed)
Pred taper sent in --- stop rayos

## 2020-02-24 DIAGNOSIS — Z1231 Encounter for screening mammogram for malignant neoplasm of breast: Secondary | ICD-10-CM | POA: Diagnosis not present

## 2020-02-24 DIAGNOSIS — Z9882 Breast implant status: Secondary | ICD-10-CM | POA: Diagnosis not present

## 2020-02-24 LAB — HM MAMMOGRAPHY

## 2020-03-01 ENCOUNTER — Other Ambulatory Visit: Payer: Self-pay

## 2020-03-01 ENCOUNTER — Encounter: Payer: Self-pay | Admitting: Orthopaedic Surgery

## 2020-03-01 ENCOUNTER — Ambulatory Visit: Payer: Medicare Other | Admitting: Orthopaedic Surgery

## 2020-03-01 VITALS — Ht 66.0 in | Wt 163.0 lb

## 2020-03-01 DIAGNOSIS — G8929 Other chronic pain: Secondary | ICD-10-CM | POA: Diagnosis not present

## 2020-03-01 DIAGNOSIS — M25511 Pain in right shoulder: Secondary | ICD-10-CM | POA: Diagnosis not present

## 2020-03-01 NOTE — Progress Notes (Signed)
Office Visit Note   Patient: Ashley Salinas           Date of Birth: 1955-11-27           MRN: [id number] Visit Date: 03/01/2020              Requested by: [id number] [place], [place] [address] [place],  [place] [address] PCP: Carollee Herter, Alferd Apa, DO   Assessment & Plan: Visit Diagnoses:  1. Chronic right shoulder pain     Plan: Impression is chronic right shoulder pain.  She mainly endorses pain across the top of the shoulder near the [facility] joint radiating down to the lateral side of the arm.  Based on findings we discussed ultrasound-guided [facility] joint injection and she would like to do that first.  She understands that it may need arthroscopic debridement if this injection does not provide significant relief.  She definitely wants to hold off on shoulder replacements for long as she can.  Follow-Up Instructions: Return for needs appt with [facility] for right [facility] joint injection.   Orders:  No orders of the defined types were placed in this encounter.  No orders of the defined types were placed in this encounter.     Procedures: No procedures performed   Clinical Data: No additional findings.   Subjective: Chief Complaint  Patient presents with  . Right Shoulder - Pain    HPI patient is a pleasant 64 year old female who comes in today with chronic left shoulder pain since May of this past year.  No known injury or change in activity.  The pain is primarily to the top of her shoulder but does radiate into the deltoid.  The pain is worse with any overhead activity as well as when she is sleeping at night.  She has tried narcotic pain medication as well as prednisone and Tylenol without long-lasting relief of symptoms.  She has had both subacromial and glenohumeral cortisone injections without long-lasting relief of symptoms.  Recent MRI of the left shoulder showed severe AC joint osteoarthritis as well as mild to moderate glenohumeral arthritis and rotator cuff  tendinopathy where there is a small undersurface tear of the supraspinatus.  Review of Systems as detailed in HPI.  All others reviewed and are negative.   Objective: Vital Signs: Ht '5\' 6"'  (1.676 m)   Wt 163 lb (73.9 kg)   BMI 26.31 kg/m   Physical Exam well-developed well-nourished female no acute distress.  Alert and oriented x3.  Ortho Exam left shoulder exam shows approximately 50% range of motion all planes secondary to pain.  She can internally rotate to her back pocket.  Moderate pain with empty can testing without weakness.  Positive cross body adduction.  Moderate tenderness to the [facility] joint.  4 out of 5 strength throughout.  She is neurovascular intact distally.  Specialty Comments:  No specialty comments available.  Imaging: No new imaging   PMFS History: Patient Active Problem List   Diagnosis Date Noted  . Chronic right shoulder pain 03/01/2020  . Chronic midline low back pain without sciatica 02/16/2020  . Subacromial bursitis of right shoulder joint 11/10/2019  . Synovitis of right shoulder 08/17/2019  . Hyperlipidemia 04/21/2018  . DEGENERATIVE JOINT DISEASE, HIPS 08/17/2009  . HIP PAIN, BILATERAL 08/02/2009  . DEPRESSION 07/06/2008  . CALLUS, RIGHT FOOT 06/05/2007  . SYNOVIAL CYST 06/05/2007  . ELECTROCARDIOGRAM, ABNORMAL 06/05/2007   Past Medical History:  Diagnosis Date  . Depression  Family History  Problem Relation Age of Onset  . Breast cancer Mother   . Cancer Mother 20       breast  . Arthritis Father   . Cancer Father        multiple myeloma  stage 3B    Past Surgical History:  Procedure Laterality Date  . APPENDECTOMY  1990  . BREAST ENHANCEMENT SURGERY    . LUMBAR FUSION  03/21/2007  . SMALL INTESTINE SURGERY  1990  . 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 TOTAL HIP ARTHROPLASTY     Left- Dr.Moore  . TUBAL LIGATION  2004   Social History   Occupational History    Comment: unemployed--  looking for another job  Tobacco Use  . Smoking status: Current Some Day Smoker    Packs/day: 0.50    Years: 35.00    Pack years: 17.50    Types: Cigarettes    Last attempt to quit: 10/04/2010    Years since quitting: 9.4  . Smokeless tobacco: Never Used  . Tobacco comment: smoking on and off-- few cig here and there  Substance and Sexual Activity  . Alcohol use: Yes    Alcohol/week: 1.0 standard drink    Types: 1 Glasses of wine per week    Comment: rare-- actually < 1x   . Drug use: No  . Sexual activity: Yes    Partners: Male

## 2020-03-09 ENCOUNTER — Ambulatory Visit: Payer: Self-pay

## 2020-03-09 ENCOUNTER — Ambulatory Visit: Payer: Medicare Other | Admitting: Family Medicine

## 2020-03-09 ENCOUNTER — Other Ambulatory Visit: Payer: Self-pay

## 2020-03-09 DIAGNOSIS — G8929 Other chronic pain: Secondary | ICD-10-CM | POA: Diagnosis not present

## 2020-03-09 DIAGNOSIS — M25511 Pain in right shoulder: Secondary | ICD-10-CM

## 2020-03-09 MED ORDER — TRAMADOL HCL 50 MG PO TABS: 50.0000 mg | ORAL_TABLET | Freq: Four times a day (QID) | ORAL | 0 refills | Status: DC | PRN

## 2020-03-09 NOTE — Progress Notes (Signed)
Office Visit Note   Patient: Ashley Salinas           Date of Birth: April 06, 1955           MRN: [id number] Visit Date: 03/09/2020 Requested by: [address], [place], [place] [address] [place],  [place] [address] PCP: Carollee Herter, Alferd Apa, DO  Subjective: Chief Complaint  Patient presents with  . Right Shoulder - Pain, Follow-up    AC joint cortisone injection under ultrasound guidance per Dr. Erlinda Hong    HPI: She is here for planned right shoulder AC joint injection.  Recent MRI scan showed quite a bit of arthritis with edema.              ROS:   All other systems were reviewed and are negative.  Objective: Vital Signs: There were no vitals taken for this visit.  Physical Exam:  General:  Alert and oriented, in no acute distress. Pulm:  Breathing unlabored. Psy:  Normal mood, congruent affect.  Right shoulder: She is point tender over the [facility] joint.    Imaging: US Guided Needle Placement - No Linked Charges  Result Date: 03/09/2020 Right AC joint injection: After sterile prep with Betadine, injected 3 cc 0.25% bupivacaine and 6 mg betamethasone using out of plane approach without complication.   Assessment & Plan: 1.  Right shoulder AC joint arthropathy -Ultrasound-guided injection given today as above.  Tramadol as needed.  Follow-up with Dr. Erlinda Hong.     Procedures: No procedures performed        PMFS History: Patient Active Problem List   Diagnosis Date Noted  . Chronic right shoulder pain 03/01/2020  . Chronic midline low back pain without sciatica 02/16/2020  . Subacromial bursitis of right shoulder joint 11/10/2019  . Synovitis of right shoulder 08/17/2019  . Hyperlipidemia 04/21/2018  . DEGENERATIVE JOINT DISEASE, HIPS 08/17/2009  . HIP PAIN, BILATERAL 08/02/2009  . DEPRESSION 07/06/2008  . CALLUS, RIGHT FOOT 06/05/2007  . SYNOVIAL CYST 06/05/2007  . ELECTROCARDIOGRAM, ABNORMAL 06/05/2007   Past Medical History:  Diagnosis Date  . Depression      Family History  Problem Relation Age of Onset  . Breast cancer Mother   . Cancer Mother 57       breast  . Arthritis Father   . Cancer Father        multiple myeloma  stage 3B    Past Surgical History:  Procedure Laterality Date  . APPENDECTOMY  1990  . BREAST ENHANCEMENT SURGERY    . LUMBAR FUSION  03/21/2007  . SMALL INTESTINE SURGERY  1990  . 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 TOTAL HIP ARTHROPLASTY     Left- Dr.Moore  . TUBAL LIGATION  2004   Social History   Occupational History    Comment: unemployed-- looking for another job  Tobacco Use  . Smoking status: Current Some Day Smoker    Packs/day: 0.50    Years: 35.00    Pack years: 17.50    Types: Cigarettes    Last attempt to quit: 10/04/2010    Years since quitting: 9.4  . Smokeless tobacco: Never Used  . Tobacco comment: smoking on and off-- few cig here and there  Substance and Sexual Activity  . Alcohol use: Yes    Alcohol/week: 1.0 standard drink    Types: 1 Glasses of wine per week    Comment: rare-- actually < 1x   .  Drug use: No  . Sexual activity: Yes    Partners: Male

## 2020-04-11 DIAGNOSIS — H43811 Vitreous degeneration, right eye: Secondary | ICD-10-CM | POA: Diagnosis not present

## 2020-04-11 DIAGNOSIS — H25813 Combined forms of age-related cataract, bilateral: Secondary | ICD-10-CM | POA: Diagnosis not present

## 2020-04-11 DIAGNOSIS — H35413 Lattice degeneration of retina, bilateral: Secondary | ICD-10-CM | POA: Diagnosis not present

## 2020-04-11 DIAGNOSIS — H04123 Dry eye syndrome of bilateral lacrimal glands: Secondary | ICD-10-CM | POA: Diagnosis not present

## 2020-04-11 DIAGNOSIS — H02055 Trichiasis without entropian left lower eyelid: Secondary | ICD-10-CM | POA: Diagnosis not present

## 2020-04-14 ENCOUNTER — Encounter: Payer: Self-pay | Admitting: Family Medicine

## 2020-04-14 NOTE — Telephone Encounter (Signed)
Please advise. We have not written a letter for this before and pt has not been seen since December 2021.Medical records received disability determination on 03/22/20.

## 2020-04-16 NOTE — Telephone Encounter (Signed)
Probably best she come in to discuss the letter

## 2020-04-19 ENCOUNTER — Other Ambulatory Visit: Payer: Self-pay

## 2020-04-19 ENCOUNTER — Ambulatory Visit (INDEPENDENT_AMBULATORY_CARE_PROVIDER_SITE_OTHER): Payer: Medicare Other | Admitting: Family Medicine

## 2020-04-19 VITALS — BP 132/80 | HR 78 | Temp 97.8°F | Resp 17 | Wt 171.2 lb

## 2020-04-19 DIAGNOSIS — M545 Low back pain, unspecified: Secondary | ICD-10-CM

## 2020-04-19 DIAGNOSIS — M549 Dorsalgia, unspecified: Secondary | ICD-10-CM

## 2020-04-19 DIAGNOSIS — G8929 Other chronic pain: Secondary | ICD-10-CM

## 2020-04-19 DIAGNOSIS — M25511 Pain in right shoulder: Secondary | ICD-10-CM

## 2020-04-19 NOTE — Progress Notes (Signed)
Patient ID: Ashley Salinas, female    DOB: [birth year]  Age: 65 y.o. MRN: [id number]    Subjective:  Subjective  HPI [name] presents for f/u shoulder pain and low back pain.  Pt is seeing ortho for ac joint arthropathy and injections----  She is applying for ss disability and needs a letter from [place] per pt.  Pt has been out of work since nov 2020.  She is unable to be on her feet long periods or sit for long periods and is unable to lift or use r arm much due to shoulder and is maybe having surgery down the road.    Pt struggles to bend over and bathing is a task.  She still wakes up at night with pain in arm and legs.  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  Eyes: Negative for pain, redness and visual disturbance.  Respiratory: Negative for cough, chest tightness, shortness of breath and wheezing.   Cardiovascular: Negative for chest pain, palpitations and leg swelling.  Endocrine: Negative for cold intolerance, heat intolerance, polydipsia, polyphagia and polyuria.  Genitourinary: Negative for difficulty urinating, dysuria and frequency.  Musculoskeletal: Positive for arthralgias and back pain.  Neurological: Negative for dizziness, light-headedness, numbness and headaches.    History Past Medical History:  Diagnosis Date  . 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   Her family history includes Arthritis in her father; Breast cancer in her mother; Cancer in her father; Cancer (age of onset: 43) in her mother.She reports that she has  been smoking cigarettes. She has a 17.50 pack-year smoking history. She has never used smokeless tobacco. She reports current alcohol use of about 1.0 standard drink of alcohol per week. She reports that she does not use drugs.  Current Outpatient Medications on File Prior to Visit  Medication Sig Dispense Refill  . acetaminophen (TYLENOL) 325 MG tablet     . Multiple Vitamin (MULTIVITAMIN) capsule Take 1 capsule by mouth daily.    . traMADol (ULTRAM) 50 MG tablet Take 1 tablet (50 mg total) by mouth every 6 (six) hours as needed. 20 tablet 0  . predniSONE (DELTASONE) 10 MG tablet TAKE 3 TABLETS PO QD FOR 3 DAYS THEN TAKE 2 TABLETS PO QD FOR 3 DAYS THEN TAKE 1 TABLET PO QD FOR 3 DAYS THEN TAKE 1/2 TAB PO QD FOR 3 DAYS (Patient not taking: Reported on 04/19/2020) 20 tablet 0   No current facility-administered medications on file prior to visit.     Objective:  Objective  Physical Exam Vitals and nursing note reviewed.  Constitutional:      Appearance: She is well-developed and well-nourished.  HENT:     Head: Normocephalic and atraumatic.  Eyes:     Extraocular Movements: EOM normal.     Conjunctiva/sclera: Conjunctivae normal.  Neck:     Thyroid: No thyromegaly.     Vascular: No carotid bruit or JVD.  Cardiovascular:     Rate and Rhythm: Normal rate and regular rhythm.     Heart sounds: Normal heart sounds. No murmur heard.  Pulmonary:     Effort: Pulmonary effort is normal. No respiratory distress.     Breath sounds: Normal breath sounds. No wheezing or rales.  Chest:     Chest wall: No tenderness.  Musculoskeletal:        General: Tenderness present. No edema.     Right shoulder: Tenderness present. Decreased range of motion.     Left shoulder: Normal.       Arms:     Cervical back: Normal range of motion and neck supple.  Neurological:     Mental Status: She is alert and oriented to person, place, and time.     Motor: Weakness present.     Gait: Gait abnormal.      Deep Tendon Reflexes: Reflexes normal.     Comments: Pt with pain with weight bearing and weakness in legs  B/l  Pt R shoulder ----dec rom and weakness in shoulder   Psychiatric:        Mood and Affect: Mood and affect normal.    BP 132/80 (BP Location: Left Arm, Patient Position: Sitting, Cuff Size: Normal)   Pulse 78   Temp 97.8 F (36.6 C) (Oral)   Resp 17   Wt 171 lb 3.2 oz (77.7 kg)   SpO2 100%   BMI 27.63 kg/m  Wt Readings from Last 3 Encounters:  04/19/20 171 lb 3.2 oz (77.7 kg)  03/01/20 163 lb (73.9 kg)  02/16/20 163 lb 12.8 oz (74.3 kg)     Lab Results  Component Value Date   WBC 6.2 05/11/2019   HGB 14.1 05/11/2019   HCT 41.1 05/11/2019   PLT 225.0 05/11/2019   GLUCOSE 82 05/11/2019   CHOL 200 05/11/2019   TRIG 129.0 05/11/2019   HDL 70.10 05/11/2019   LDLDIRECT 104.0 04/21/2018   LDLCALC 104 (H) 05/11/2019   ALT 19 05/11/2019   AST 23 05/11/2019   NA 138 05/11/2019   K 4.2 05/11/2019   CL 105 05/11/2019   CREATININE 0.91 05/11/2019   BUN 23 05/11/2019   CO2 24 05/11/2019   TSH 1.49 05/11/2019    MR Shoulder Right Wo Contrast  Result Date: 02/22/2020 CLINICAL DATA:  Right shoulder pain and limited range of motion for 7 months. No known injury. EXAM: MRI OF THE RIGHT SHOULDER WITHOUT CONTRAST TECHNIQUE: Multiplanar, multisequence MR imaging of the shoulder was performed. No intravenous contrast was administered. COMPARISON:  Plain films right shoulder 07/23/2019 FINDINGS: Rotator cuff: The patient has rotator cuff tendinopathy which appears worst in the supraspinatus. A shallow undersurface tear of the anterior and far lateral supraspinatus measures 0.2 cm from front to back. No retraction. The cuff is otherwise intact. Muscles:  Normal without atrophy or focal lesion. Biceps long head: Intact. There is some tendinopathy of the intra-articular segment. Acromioclavicular Joint: Moderately severe degenerative change is present with intense marrow edema about  the joint. Type 2 acromion. There is some fluid in the subacromial/subdeltoid bursa. Glenohumeral Joint: Mild-to-moderate degenerative change is seen with cartilage thinning and a small osteophyte off the humeral head. No subchondral edema or cyst formation. Labrum:  Intact. Bones:  No fracture or focal lesion. Other: None. IMPRESSION: Rotator cuff tendinopathy appears worst in the supraspinatus where there is a 0.2 cm undersurface tear without retraction or atrophy. Moderately severe acromioclavicular osteoarthritis with intense marrow edema about the joint. 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 Small volume of subacromial/subdeltoid fluid consistent with bursitis. Electronically Signed   By: Inge Rise M.D.  On: 02/22/2020 08:53     Assessment & Plan:  Plan  I am having Kilyn L. Cona maintain her multivitamin, predniSONE, acetaminophen, and traMADol.  No orders of the defined types were placed in this encounter.   Problem List Items Addressed This Visit      Unprioritized   Chronic right shoulder pain    Per ortho  Pt is waiting for ss disability  Letter written Pt unable to work due to shoulder and back pain        Other Visit Diagnoses    Chronic midline back pain, unspecified back location    -  Primary   Relevant Orders   MR Lumbar Spine Wo Contrast   Ambulatory referral to Neurosurgery      Follow-up: Return if symptoms worsen or fail to improve.  [name], DO

## 2020-04-20 ENCOUNTER — Encounter: Payer: Self-pay | Admitting: Family Medicine

## 2020-04-20 NOTE — Assessment & Plan Note (Signed)
Per ortho  Pt is waiting for ss disability  Letter written Pt unable to work due to shoulder and back pain

## 2020-04-20 NOTE — Assessment & Plan Note (Signed)
Pain worsening S/p surgery with DR Saintclair Halsted Will repeat mri and refer back to DR Saintclair Halsted

## 2020-04-21 ENCOUNTER — Ambulatory Visit (HOSPITAL_COMMUNITY)
Admission: RE | Admit: 2020-04-21 | Discharge: 2020-04-21 | Disposition: A | Discharge status: Home / Self Care | Payer: Medicare Other | Source: Ambulatory Visit | Attending: Family Medicine | Admitting: Family Medicine

## 2020-04-21 ENCOUNTER — Other Ambulatory Visit: Payer: Self-pay

## 2020-04-21 DIAGNOSIS — M549 Dorsalgia, unspecified: Secondary | ICD-10-CM | POA: Insufficient documentation

## 2020-04-21 DIAGNOSIS — M545 Low back pain, unspecified: Secondary | ICD-10-CM | POA: Diagnosis not present

## 2020-04-21 DIAGNOSIS — G8929 Other chronic pain: Secondary | ICD-10-CM | POA: Diagnosis not present

## 2020-04-24 ENCOUNTER — Encounter: Payer: Self-pay | Admitting: Family Medicine

## 2020-04-26 DIAGNOSIS — R030 Elevated blood-pressure reading, without diagnosis of hypertension: Secondary | ICD-10-CM | POA: Diagnosis not present

## 2020-04-26 DIAGNOSIS — M5440 Lumbago with sciatica, unspecified side: Secondary | ICD-10-CM | POA: Diagnosis not present

## 2020-05-04 ENCOUNTER — Encounter: Payer: Self-pay | Admitting: Family Medicine

## 2020-05-04 ENCOUNTER — Other Ambulatory Visit: Payer: Self-pay

## 2020-05-04 ENCOUNTER — Ambulatory Visit (INDEPENDENT_AMBULATORY_CARE_PROVIDER_SITE_OTHER): Payer: Medicare Other | Admitting: Family Medicine

## 2020-05-04 ENCOUNTER — Ambulatory Visit: Payer: Self-pay

## 2020-05-04 DIAGNOSIS — M25511 Pain in right shoulder: Secondary | ICD-10-CM

## 2020-05-04 DIAGNOSIS — G8929 Other chronic pain: Secondary | ICD-10-CM

## 2020-05-04 MED ORDER — TRAMADOL HCL 50 MG PO TABS: 50.0000 mg | ORAL_TABLET | Freq: Four times a day (QID) | ORAL | 0 refills | Status: DC | PRN

## 2020-05-04 NOTE — Progress Notes (Signed)
Office Visit Note   Patient: Ashley Salinas           Date of Birth: 04/27/1955           MRN: [id number] Visit Date: 05/04/2020 Requested by: [address], [place], [place] [address] [place],  [place] [address] PCP: Carollee Herter, Alferd Apa, DO  Subjective: Chief Complaint  Patient presents with  . Right Shoulder - Follow-up    Repeat AC joint injection    HPI: She is here with recurrent right shoulder pain.  AC joint injection gave about 2 months of modest improvement in pain.  She would like to try another.              ROS:   All other systems were reviewed and are negative.  Objective: Vital Signs: There were no vitals taken for this visit.  Physical Exam:  General:  Alert and oriented, in no acute distress. Pulm:  Breathing unlabored. Psy:  Normal mood, congruent affect.  Right shoulder: Still has full active range of motion but pain at the extremes.  Very tender at the [facility] joint.  Imaging: US Guided Needle Placement - No Linked Charges  Result Date: 05/04/2020 Ultrasound-guided right AC joint injection: After sterile prep with alcohol, injected 4 cc 0.25% bupivacaine and 6 mg betamethasone without complication.   Assessment & Plan: 1.  Right shoulder AC joint arthropathy -Injection given as above.  Follow-up as needed.     Procedures: No procedures performed        PMFS History: Patient Active Problem List   Diagnosis Date Noted  . Chronic right shoulder pain 03/01/2020  . Chronic midline low back pain without sciatica 02/16/2020  . Subacromial bursitis of right shoulder joint 11/10/2019  . Synovitis of right shoulder 08/17/2019  . Hyperlipidemia 04/21/2018  . DEGENERATIVE JOINT DISEASE, HIPS 08/17/2009  . HIP PAIN, BILATERAL 08/02/2009  . DEPRESSION 07/06/2008  . CALLUS, RIGHT FOOT 06/05/2007  . SYNOVIAL CYST 06/05/2007  . ELECTROCARDIOGRAM, ABNORMAL 06/05/2007   Past Medical History:  Diagnosis Date  . Depression     Family History   Problem Relation Age of Onset  . Breast cancer Mother   . Cancer Mother 17       breast  . Arthritis Father   . Cancer Father        multiple myeloma  stage 3B    Past Surgical History:  Procedure Laterality Date  . APPENDECTOMY  1990  . BREAST ENHANCEMENT SURGERY    . LUMBAR FUSION  03/21/2007  . SMALL INTESTINE SURGERY  1990  . 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 TOTAL HIP ARTHROPLASTY     Left- Dr.Moore  . TUBAL LIGATION  2004   Social History   Occupational History    Comment: unemployed-- looking for another job  Tobacco Use  . Smoking status: Current Some Day Smoker    Packs/day: 0.50    Years: 35.00    Pack years: 17.50    Types: Cigarettes    Last attempt to quit: 10/04/2010    Years since quitting: 9.5  . Smokeless tobacco: Never Used  . Tobacco comment: smoking on and off-- few cig here and there  Substance and Sexual Activity  . Alcohol use: Yes    Alcohol/week: 1.0 standard drink    Types: 1 Glasses of wine per week    Comment: rare-- actually < 1x   . Drug use:  No  . Sexual activity: Yes    Partners: Male

## 2020-05-09 DIAGNOSIS — M5416 Radiculopathy, lumbar region: Secondary | ICD-10-CM | POA: Diagnosis not present

## 2020-05-10 ENCOUNTER — Encounter: Payer: Self-pay | Admitting: Family Medicine

## 2020-05-10 NOTE — Telephone Encounter (Signed)
Pap is due feb 2023

## 2020-05-13 ENCOUNTER — Encounter: Payer: Medicare Other | Admitting: Family Medicine

## 2020-05-16 ENCOUNTER — Other Ambulatory Visit: Payer: Self-pay

## 2020-05-17 ENCOUNTER — Ambulatory Visit (INDEPENDENT_AMBULATORY_CARE_PROVIDER_SITE_OTHER): Payer: Medicare Other | Admitting: Family Medicine

## 2020-05-17 ENCOUNTER — Encounter: Payer: Self-pay | Admitting: Family Medicine

## 2020-05-17 VITALS — BP 124/84 | HR 71 | Temp 98.8°F | Resp 18 | Ht 66.0 in | Wt 170.8 lb

## 2020-05-17 DIAGNOSIS — Z136 Encounter for screening for cardiovascular disorders: Secondary | ICD-10-CM

## 2020-05-17 DIAGNOSIS — Z0000 Encounter for general adult medical examination without abnormal findings: Secondary | ICD-10-CM

## 2020-05-17 DIAGNOSIS — G8929 Other chronic pain: Secondary | ICD-10-CM | POA: Diagnosis not present

## 2020-05-17 DIAGNOSIS — M2550 Pain in unspecified joint: Secondary | ICD-10-CM | POA: Diagnosis not present

## 2020-05-17 DIAGNOSIS — M25511 Pain in right shoulder: Secondary | ICD-10-CM

## 2020-05-17 DIAGNOSIS — Z79899 Other long term (current) drug therapy: Secondary | ICD-10-CM | POA: Diagnosis not present

## 2020-05-17 MED ORDER — TRAMADOL HCL 50 MG PO TABS: 50.0000 mg | ORAL_TABLET | Freq: Four times a day (QID) | ORAL | 0 refills | Status: DC | PRN

## 2020-05-17 NOTE — Patient Instructions (Signed)
Preventive Care 84-64 Years Old, Female Preventive care refers to lifestyle choices and visits with your health care provider that can promote health and wellness. This includes:  A yearly physical exam. This is also called an annual wellness visit.  Regular dental and eye exams.  Immunizations.  Screening for certain conditions.  Healthy lifestyle choices, such as: ? Eating a healthy diet. ? Getting regular exercise. ? Not using drugs or products that contain nicotine and tobacco. ? Limiting alcohol use. What can I expect for my preventive care visit? Physical exam Your health care provider will check your:  Height and weight. These may be used to calculate your BMI (body mass index). BMI is a measurement that tells if you are at a healthy weight.  Heart rate and blood pressure.  Body temperature.  Skin for abnormal spots. Counseling Your health care provider may ask you questions about your:  Past medical problems.  Family's medical history.  Alcohol, tobacco, and drug use.  Emotional well-being.  Home life and relationship well-being.  Sexual activity.  Diet, exercise, and sleep habits.  Work and work Statistician.  Access to firearms.  Method of birth control.  Menstrual cycle.  Pregnancy history. What immunizations do I need? Vaccines are usually given at various ages, according to a schedule. Your health care provider will recommend vaccines for you based on your age, medical history, and lifestyle or other factors, such as travel or where you work.   What tests do I need? Blood tests  Lipid and cholesterol levels. These may be checked every 5 years, or more often if you are over 3 years old.  Hepatitis C test.  Hepatitis B test. Screening  Lung cancer screening. You may have this screening every year starting at age 73 if you have a 30-pack-year history of smoking and currently smoke or have quit within the past 15 years.  Colorectal cancer  screening. ? All adults should have this screening starting at age 52 and continuing until age 17. ? Your health care provider may recommend screening at age 49 if you are at increased risk. ? You will have tests every 1-10 years, depending on your results and the type of screening test.  Diabetes screening. ? This is done by checking your blood sugar (glucose) after you have not eaten for a while (fasting). ? You may have this done every 1-3 years.  Mammogram. ? This may be done every 1-2 years. ? Talk with your health care provider about when you should start having regular mammograms. This may depend on whether you have a family history of breast cancer.  BRCA-related cancer screening. This may be done if you have a family history of breast, ovarian, tubal, or peritoneal cancers.  Pelvic exam and Pap test. ? This may be done every 3 years starting at age 10. ? Starting at age 11, this may be done every 5 years if you have a Pap test in combination with an HPV test. Other tests  STD (sexually transmitted disease) testing, if you are at risk.  Bone density scan. This is done to screen for osteoporosis. You may have this scan if you are at high risk for osteoporosis. Talk with your health care provider about your test results, treatment options, and if necessary, the need for more tests. Follow these instructions at home: Eating and drinking  Eat a diet that includes fresh fruits and vegetables, whole grains, lean protein, and low-fat dairy products.  Take vitamin and mineral supplements  as recommended by your health care provider.  Do not drink alcohol if: ? Your health care provider tells you not to drink. ? You are pregnant, may be pregnant, or are planning to become pregnant.  If you drink alcohol: ? Limit how much you have to 0-1 drink a day. ? Be aware of how much alcohol is in your drink. In the [place] one drink equals one 12 oz bottle of beer (355 mL), one 5 oz glass of  wine (148 mL), or one 1 oz glass of hard liquor (44 mL).   Lifestyle  Take daily care of your teeth and gums. Brush your teeth every morning and night with fluoride toothpaste. Floss one time each day.  Stay active. Exercise for at least 30 minutes 5 or more days each week.  Do not use any products that contain nicotine or tobacco, such as cigarettes, e-cigarettes, and chewing tobacco. If you need help quitting, ask your health care provider.  Do not use drugs.  If you are sexually active, practice safe sex. Use a condom or other form of protection to prevent STIs (sexually transmitted infections).  If you do not wish to become pregnant, use a form of birth control. If you plan to become pregnant, see your health care provider for a prepregnancy visit.  If told by your health care provider, take low-dose aspirin daily starting at age 50.  Find healthy ways to cope with stress, such as: ? Meditation, yoga, or listening to music. ? Journaling. ? Talking to a trusted person. ? Spending time with friends and family. Safety  Always wear your seat belt while driving or riding in a vehicle.  Do not drive: ? If you have been drinking alcohol. Do not ride with someone who has been drinking. ? When you are tired or distracted. ? While texting.  Wear a helmet and other protective equipment during sports activities.  If you have firearms in your house, make sure you follow all gun safety procedures. What's next?  Visit your health care provider once a year for an annual wellness visit.  Ask your health care provider how often you should have your eyes and teeth checked.  Stay up to date on all vaccines. This information is not intended to replace advice given to you by your health care provider. Make sure you discuss any questions you have with your health care provider. Document Revised: 11/24/2019 Document Reviewed: 10/31/2017 Elsevier Patient Education  2021 Elsevier Inc.  

## 2020-05-17 NOTE — Progress Notes (Signed)
Subjective:     Ashley Salinas is a 65 y.o. female and is here for a comprehensive physical exam. The patient reports no problems.  Social History   Socioeconomic History  . Marital status: Widowed    Spouse name: Not on file  . Number of children: Not on file  . Years of education: Not on file  . Highest education level: Not on file  Occupational History    Comment: unemployed-- looking for another job  Tobacco Use  . Smoking status: Current Some Day Smoker    Packs/day: 0.50    Years: 35.00    Pack years: 17.50    Types: Cigarettes    Last attempt to quit: 10/04/2010    Years since quitting: 9.6  . Smokeless tobacco: Never Used  . Tobacco comment: smoking on and off-- few cig here and there  Substance and Sexual Activity  . Alcohol use: Yes    Alcohol/week: 1.0 standard drink    Types: 1 Glasses of wine per week    Comment: rare-- actually < 1x   . Drug use: No  . Sexual activity: Yes    Partners: Male  Other Topics Concern  . Not on file  Social History Narrative   Exercise-  no   Social Determinants of Health   Financial Resource Strain: Not on file  Food Insecurity: Not on file  Transportation Needs: Not on file  Physical Activity: Not on file  Stress: Not on file  Social Connections: Not on file  Intimate Partner Violence: Not on file   Health Maintenance  Topic Date Due  . MAMMOGRAM  02/23/2021  . COLONOSCOPY (Pts 45-68yrs Insurance coverage will need to be confirmed)  03/05/2021  . PAP SMEAR-Modifier  04/21/2021  . TETANUS/TDAP  10/28/2026  . INFLUENZA VACCINE  Completed  . COVID-19 Vaccine  Completed  . Hepatitis C Screening  Completed  . HIV Screening  Completed  . HPV VACCINES  Aged Out    The following portions of the patient's history were reviewed and updated as appropriate:  She  has a past medical history of Depression. She does not have any pertinent problems on file. She  has a past surgical history that includes Lumbar fusion (03/21/2007);  Breast enhancement surgery; Total hip arthroplasty (10/11/2010,  01/2011); Appendectomy (1990); Small intestine surgery (1990); Tubal ligation (2004); and Total hip arthroplasty. Her family history includes Arthritis in her father; Breast cancer in her mother; Cancer in her father; Cancer (age of onset: 18) in her mother. She  reports that she has been smoking cigarettes. She has a 17.50 pack-year smoking history. She has never used smokeless tobacco. She reports current alcohol use of about 1.0 standard drink of alcohol per week. She reports that she does not use drugs. She has a current medication list which includes the following prescription(s): acetaminophen, multivitamin, and tramadol. Current Outpatient Medications on File Prior to Visit  Medication Sig Dispense Refill  . acetaminophen (TYLENOL) 325 MG tablet     . Multiple Vitamin (MULTIVITAMIN) capsule Take 1 capsule by mouth daily.     No current facility-administered medications on file prior to visit.   She is allergic to azithromycin, oxycodone, and naproxen..  Review of Systems Review of Systems  Constitutional: Negative for activity change, appetite change and fatigue.  HENT: Negative for hearing loss, congestion, tinnitus and ear discharge.  dentist q103m Eyes: Negative for visual disturbance (see optho q1y -- vision corrected to 20/20 with glasses).  Respiratory: Negative for cough,  chest tightness and shortness of breath.   Cardiovascular: Negative for chest pain, palpitations and leg swelling.  Gastrointestinal: Negative for abdominal pain, diarrhea, constipation and abdominal distention.  Genitourinary: Negative for urgency, frequency, decreased urine volume and difficulty urinating.  Musculoskeletal:  + L shoulder pain and hip pain -- walking with cane  Skin: Negative for color change, pallor and rash.  Neurological: Negative for dizziness, light-headedness, numbness and headaches.  Hematological: Negative for adenopathy.  Does not bruise/bleed easily.  Psychiatric/Behavioral: Negative for suicidal ideas, confusion, sleep disturbance, self-injury, dysphoric mood, decreased concentration and agitation.       Objective:    BP 124/84 (BP Location: Right Arm, Patient Position: Sitting, Cuff Size: Normal)   Pulse 71   Temp 98.8 F (37.1 C) (Oral)   Resp 18   Ht 5\' 6"  (1.676 m)   Wt 170 lb 12.8 oz (77.5 kg)   SpO2 99%   BMI 27.57 kg/m  General appearance: alert, cooperative, appears stated age and no distress Head: Normocephalic, without obvious abnormality, atraumatic Eyes: negative findings: lids and lashes normal, conjunctivae and sclerae normal and pupils equal, round, reactive to light and accomodation Ears: normal TM's and external ear canals both ears Nose: Nares normal. Septum midline. Mucosa normal. No drainage or sinus tenderness. Throat: lips, mucosa, and tongue normal; teeth and gums normal Neck: no adenopathy, no carotid bruit, no JVD, supple, symmetrical, trachea midline and thyroid not enlarged, symmetric, no tenderness/mass/nodules Back: symmetric, no curvature. ROM normal. No CVA tenderness. Lungs: clear to auscultation bilaterally Breasts: normal appearance, no masses or tenderness Heart: regular rate and rhythm, S1, S2 normal, no murmur, click, rub or gallop Abdomen: soft, non-tender; bowel sounds normal; no masses,  no organomegaly Pelvic: deferred Extremities: extremities normal, atraumatic, no cyanosis or edema Pulses: 2+ and symmetric Skin: Skin color, texture, turgor normal. No rashes or lesions Lymph nodes: Cervical, supraclavicular, and axillary nodes normal. Neurologic: Alert and oriented X 3, normal strength and tone. Normal symmetric reflexes. Normal coordination and gait    Assessment:    Healthy female       Plan:     1. High risk medication use  - DRUG MONITORING, PANEL 8 WITH CONFIRMATION, URINE  2. Multiple joint pain Stable Refill med  Data base reviewed   uds and contract updated  - traMADol (ULTRAM) 50 MG tablet; Take 1 tablet (50 mg total) by mouth every 6 (six) hours as needed.  Dispense: 20 tablet; Refill: 0 - CBC with Differential/Platelet  3. Preventative health care See above   4. Chronic right shoulder pain Per ortho  Better after injection  - CBC with Differential/Platelet  5. Ischemic heart disease screen Check labs  - DRUG MONITORING, PANEL 8 WITH CONFIRMATION, URINE - Lipid panel - Comprehensive metabolic panel - CBC with Differential/Platelet  See After Visit Summary for Counseling Recommendations

## 2020-05-18 LAB — COMPREHENSIVE METABOLIC PANEL
ALT: 16 U/L (ref 0–35)
AST: 18 U/L (ref 0–37)
Albumin: 4.4 g/dL (ref 3.5–5.2)
Alkaline Phosphatase: 46 U/L (ref 39–117)
BUN: 20 mg/dL (ref 6–23)
CO2: 25 mEq/L (ref 19–32)
Calcium: 9.7 mg/dL (ref 8.4–10.5)
Chloride: 103 mEq/L (ref 96–112)
Creatinine, Ser: 0.77 mg/dL (ref 0.40–1.20)
GFR: 82.08 mL/min (ref >60.00)
Glucose, Bld: 78 mg/dL (ref 70–99)
Potassium: 4.3 mEq/L (ref 3.5–5.1)
Sodium: 138 mEq/L (ref 135–145)
Total Bilirubin: 0.4 mg/dL (ref 0.2–1.2)
Total Protein: 7.1 g/dL (ref 6.0–8.3)

## 2020-05-18 LAB — CBC WITH DIFFERENTIAL/PLATELET
Basophils Absolute: 0.1 10*3/uL (ref 0.0–0.1)
Basophils Relative: 0.9 % (ref 0.0–3.0)
Eosinophils Absolute: 0.1 10*3/uL (ref 0.0–0.7)
Eosinophils Relative: 1.1 % (ref 0.0–5.0)
HCT: 41.5 % (ref 36.0–46.0)
Hemoglobin: 14.2 g/dL (ref 12.0–15.0)
Lymphocytes Relative: 31.2 % (ref 12.0–46.0)
Lymphs Abs: 1.8 10*3/uL (ref 0.7–4.0)
MCHC: 34.2 g/dL (ref 30.0–36.0)
MCV: 98.4 fl (ref 78.0–100.0)
Monocytes Absolute: 0.4 10*3/uL (ref 0.1–1.0)
Monocytes Relative: 7.4 % (ref 3.0–12.0)
Neutro Abs: 3.4 10*3/uL (ref 1.4–7.7)
Neutrophils Relative %: 59.4 % (ref 43.0–77.0)
Platelets: 221 10*3/uL (ref 150.0–400.0)
RBC: 4.22 Mil/uL (ref 3.87–5.11)
RDW: 13.7 % (ref 11.5–15.5)
WBC: 5.8 10*3/uL (ref 4.0–10.5)

## 2020-05-18 LAB — LIPID PANEL
Cholesterol: 219 mg/dL — ABNORMAL HIGH (ref 0–200)
HDL: 79.7 mg/dL (ref >39.00)
LDL Cholesterol: 102 mg/dL — ABNORMAL HIGH (ref 0–99)
NonHDL: 139.07
Total CHOL/HDL Ratio: 3
Triglycerides: 186 mg/dL — ABNORMAL HIGH (ref 0.0–149.0)
VLDL: 37.2 mg/dL (ref 0.0–40.0)

## 2020-05-19 LAB — DRUG MONITORING, PANEL 8 WITH CONFIRMATION, URINE
6 Acetylmorphine: NEGATIVE ng/mL (ref <10)
Alcohol Metabolites: POSITIVE ng/mL — AB
Amphetamines: NEGATIVE ng/mL (ref <500)
Benzodiazepines: NEGATIVE ng/mL (ref <100)
Buprenorphine, Urine: NEGATIVE ng/mL (ref <5)
Cocaine Metabolite: NEGATIVE ng/mL (ref <150)
Creatinine: 47.2 mg/dL
Ethyl Glucuronide (ETG): 15765 ng/mL — ABNORMAL HIGH (ref <500)
Ethyl Sulfate (ETS): 5071 ng/mL — ABNORMAL HIGH (ref <100)
MDMA: NEGATIVE ng/mL (ref <500)
Marijuana Metabolite: NEGATIVE ng/mL (ref <20)
Opiates: NEGATIVE ng/mL (ref <100)
Oxidant: NEGATIVE ug/mL
Oxycodone: NEGATIVE ng/mL (ref <100)
pH: 5.3 (ref 4.5–9.0)

## 2020-05-19 LAB — DM TEMPLATE

## 2020-05-31 DIAGNOSIS — M5440 Lumbago with sciatica, unspecified side: Secondary | ICD-10-CM | POA: Diagnosis not present

## 2020-05-31 DIAGNOSIS — R030 Elevated blood-pressure reading, without diagnosis of hypertension: Secondary | ICD-10-CM | POA: Diagnosis not present

## 2020-08-30 ENCOUNTER — Telehealth: Payer: Self-pay | Admitting: Family Medicine

## 2020-08-30 DIAGNOSIS — M5440 Lumbago with sciatica, unspecified side: Secondary | ICD-10-CM | POA: Diagnosis not present

## 2020-08-30 DIAGNOSIS — R030 Elevated blood-pressure reading, without diagnosis of hypertension: Secondary | ICD-10-CM | POA: Diagnosis not present

## 2020-08-30 DIAGNOSIS — M5126 Other intervertebral disc displacement, lumbar region: Secondary | ICD-10-CM | POA: Diagnosis not present

## 2020-08-30 NOTE — Telephone Encounter (Signed)
Patient Declined AWV today

## 2020-09-27 DIAGNOSIS — M5416 Radiculopathy, lumbar region: Secondary | ICD-10-CM | POA: Diagnosis not present

## 2020-10-13 DIAGNOSIS — M5416 Radiculopathy, lumbar region: Secondary | ICD-10-CM | POA: Diagnosis not present

## 2020-10-13 DIAGNOSIS — R030 Elevated blood-pressure reading, without diagnosis of hypertension: Secondary | ICD-10-CM | POA: Diagnosis not present

## 2020-11-22 DIAGNOSIS — M5416 Radiculopathy, lumbar region: Secondary | ICD-10-CM | POA: Diagnosis not present

## 2020-12-07 ENCOUNTER — Encounter: Payer: Self-pay | Admitting: Family Medicine

## 2020-12-20 DIAGNOSIS — M5440 Lumbago with sciatica, unspecified side: Secondary | ICD-10-CM | POA: Diagnosis not present

## 2021-02-07 DIAGNOSIS — M5416 Radiculopathy, lumbar region: Secondary | ICD-10-CM | POA: Diagnosis not present

## 2021-03-01 DIAGNOSIS — Z1231 Encounter for screening mammogram for malignant neoplasm of breast: Secondary | ICD-10-CM | POA: Diagnosis not present

## 2021-03-01 LAB — HM MAMMOGRAPHY

## 2021-03-04 ENCOUNTER — Encounter: Payer: Self-pay | Admitting: Family Medicine

## 2021-04-10 ENCOUNTER — Encounter: Payer: Self-pay | Admitting: Family Medicine

## 2021-04-10 DIAGNOSIS — Z96643 Presence of artificial hip joint, bilateral: Secondary | ICD-10-CM

## 2021-04-10 MED ORDER — AMOXICILLIN 500 MG PO CAPS: ORAL_CAPSULE | ORAL | 1 refills | Status: DC

## 2021-04-11 DIAGNOSIS — M5416 Radiculopathy, lumbar region: Secondary | ICD-10-CM | POA: Diagnosis not present

## 2021-04-25 DIAGNOSIS — H43811 Vitreous degeneration, right eye: Secondary | ICD-10-CM | POA: Diagnosis not present

## 2021-04-25 DIAGNOSIS — H04123 Dry eye syndrome of bilateral lacrimal glands: Secondary | ICD-10-CM | POA: Diagnosis not present

## 2021-04-25 DIAGNOSIS — H5203 Hypermetropia, bilateral: Secondary | ICD-10-CM | POA: Diagnosis not present

## 2021-04-25 DIAGNOSIS — H35413 Lattice degeneration of retina, bilateral: Secondary | ICD-10-CM | POA: Diagnosis not present

## 2021-04-25 DIAGNOSIS — H25813 Combined forms of age-related cataract, bilateral: Secondary | ICD-10-CM | POA: Diagnosis not present

## 2021-04-25 DIAGNOSIS — H524 Presbyopia: Secondary | ICD-10-CM | POA: Diagnosis not present

## 2021-05-16 DIAGNOSIS — M5416 Radiculopathy, lumbar region: Secondary | ICD-10-CM | POA: Diagnosis not present

## 2021-05-22 ENCOUNTER — Ambulatory Visit (INDEPENDENT_AMBULATORY_CARE_PROVIDER_SITE_OTHER): Payer: Medicare Other | Admitting: Family Medicine

## 2021-05-22 ENCOUNTER — Encounter: Payer: Self-pay | Admitting: Family Medicine

## 2021-05-22 VITALS — BP 140/90 | HR 76 | Temp 98.0°F | Resp 18 | Ht 66.0 in | Wt 163.0 lb

## 2021-05-22 DIAGNOSIS — Z0000 Encounter for general adult medical examination without abnormal findings: Secondary | ICD-10-CM

## 2021-05-22 DIAGNOSIS — E785 Hyperlipidemia, unspecified: Secondary | ICD-10-CM

## 2021-05-22 DIAGNOSIS — Z86010 Personal history of colonic polyps: Secondary | ICD-10-CM | POA: Diagnosis not present

## 2021-05-22 NOTE — Assessment & Plan Note (Signed)
ghm utd Check labs  

## 2021-05-22 NOTE — Patient Instructions (Signed)

## 2021-05-22 NOTE — Assessment & Plan Note (Signed)
Encourage heart healthy diet such as MIND or DASH diet, increase exercise, avoid trans fats, simple carbohydrates and processed foods, consider a krill or fish or flaxseed oil cap daily.  °

## 2021-05-22 NOTE — Progress Notes (Signed)
? ?Subjective:  ? ?By signing my name below, I, Ashley Salinas, attest that this documentation has been prepared under the direction and in the presence of [name] DO, 05/22/2021   ? ? Patient ID: Ashley Salinas, female    DOB: [birth year], 66 y.o.   MRN: [id number] ? ?Chief Complaint  ?Patient presents with  ? Annual Exam  ?  Pt states fasting   ? ? ?HPI ?Patient is in today for a comprehensive physical exam.  ? ?She is interested in a referral for a colonoscopy.  ? ?She denies having any fever, ear pain, new muscle pain, joint pain, new moles, congestion, sinus pain, sore throat, palpations, wheezing, n/v/d, constipation, blood in stool, dysuria, frequency, hematuria at this time. ? ?She reports no changes in family history. She has not had any recent surgeries. ?Her colonoscopy was last completed 03/05/2016 ?Her Dexa was last completed 12/05/2016 ?Her pap smear was last completed 04/21/2018 ?Her mammogram was last completed 03/01/2021 ?She is UTD on her vaccinations.  ?She occasionally walks around the neighborhood. ? ? ? ?Past Medical History:  ?Diagnosis Date  ? Depression   ? ? ?Past Surgical History:  ?Procedure Laterality Date  ? APPENDECTOMY  1990  ? BREAST ENHANCEMENT SURGERY    ? LUMBAR FUSION  03/21/2007  ? SMALL INTESTINE SURGERY  1990  ? TOTAL HIP ARTHROPLASTY  10/11/2010,  01/2011  ? Left- Dr.Moore--- revision  ? TOTAL HIP ARTHROPLASTY    ? Left- Dr.Moore  ? TUBAL LIGATION  2004  ? ? ?Family History  ?Problem Relation Age of Onset  ? Breast cancer Mother   ? Cancer Mother 59  ?     breast  ? Arthritis Father   ? Cancer Father   ?     multiple myeloma  stage 3B  ? ? ?Social History  ? ?Socioeconomic History  ? Marital status: Widowed  ?  Spouse name: Not on file  ? Number of children: Not on file  ? Years of education: Not on file  ? Highest education level: Not on file  ?Occupational History  ?  Comment: unemployed-- looking for another job  ?Tobacco Use  ? Smoking status: Some Days  ?  Packs/day:  0.50  ?  Years: 35.00  ?  Pack years: 17.50  ?  Types: Cigarettes  ?  Last attempt to quit: 10/04/2010  ?  Years since quitting: 10.6  ? Smokeless tobacco: Never  ? Tobacco comments:  ?  smoking on and off-- few cig here and there  ?Substance and Sexual Activity  ? Alcohol use: Yes  ?  Alcohol/week: 1.0 standard drink  ?  Types: 1 Glasses of wine per week  ?  Comment: rare-- actually < 1x   ? Drug use: No  ? Sexual activity: Yes  ?  Partners: Male  ?Other Topics Concern  ? Not on file  ?Social History Narrative  ? Exercise-  walking some   ? ?Social Determinants of Health  ? ?Financial Resource Strain: Not on file  ?Food Insecurity: Not on file  ?Transportation Needs: Not on file  ?Physical Activity: Not on file  ?Stress: Not on file  ?Social Connections: Not on file  ?Intimate Partner Violence: Not on file  ? ? ?Outpatient Medications Prior to Visit  ?Medication Sig Dispense Refill  ? acetaminophen (TYLENOL) 325 MG tablet     ? Multiple Vitamin (MULTIVITAMIN) capsule Take 1 capsule by mouth daily.    ? amoxicillin (AMOXIL) 500 MG capsule  4 capsules (2gm) po 1 hour prior to dental work (Patient not taking: Reported on 05/22/2021) 12 capsule 1  ? traMADol (ULTRAM) 50 MG tablet Take 1 tablet (50 mg total) by mouth every 6 (six) hours as needed. 20 tablet 0  ? ?No facility-administered medications prior to visit.  ? ? ?Allergies  ?Allergen Reactions  ? Azithromycin Nausea Only  ? Oxycodone Nausea Only  ? Naproxen Rash  ? ? ?Review of Systems  ?Constitutional:  Negative for fever.  ?HENT:  Negative for congestion, ear pain, sinus pain and sore throat.   ?Respiratory:  Negative for wheezing.   ?Cardiovascular:  Negative for palpitations.  ?Gastrointestinal:  Negative for blood in stool, constipation, diarrhea, nausea and vomiting.  ?Genitourinary:  Negative for dysuria, frequency and hematuria.  ?Musculoskeletal:  Negative for joint pain and myalgias.  ?Skin:   ?     (-) New Moles  ? ?   ?Objective:  ?  ?Physical  Exam ?Constitutional:   ?   General: She is not in acute distress. ?   Appearance: Normal appearance. She is not ill-appearing.  ?HENT:  ?   Head: Normocephalic and atraumatic.  ?   Right Ear: Tympanic membrane, ear canal and external ear normal.  ?   Left Ear: Tympanic membrane, ear canal and external ear normal.  ?Eyes:  ?   Extraocular Movements: Extraocular movements intact.  ?   Pupils: Pupils are equal, round, and reactive to light.  ?Cardiovascular:  ?   Rate and Rhythm: Normal rate and regular rhythm.  ?   Heart sounds: Normal heart sounds. No murmur heard. ?  No gallop.  ?Pulmonary:  ?   Effort: Pulmonary effort is normal. No respiratory distress.  ?   Breath sounds: Normal breath sounds. No wheezing or rales.  ?Abdominal:  ?   General: Bowel sounds are normal. There is no distension.  ?   Palpations: Abdomen is soft.  ?   Tenderness: There is no abdominal tenderness. There is no guarding.  ?Skin: ?   General: Skin is warm and dry.  ?Neurological:  ?   Mental Status: She is alert and oriented to person, place, and time.  ?Psychiatric:     ?   Judgment: Judgment normal.  ? ? ?BP 140/90 (BP Location: Right Arm, Patient Position: Sitting, Cuff Size: Normal)   Pulse 76   Temp 98 ?F (36.7 ?C) (Oral)   Resp 18   Ht '5\' 6"'  (1.676 m)   Wt 163 lb (73.9 kg)   SpO2 99%   BMI 26.31 kg/m?  ?Wt Readings from Last 3 Encounters:  ?05/22/21 163 lb (73.9 kg)  ?05/17/20 170 lb 12.8 oz (77.5 kg)  ?04/19/20 171 lb 3.2 oz (77.7 kg)  ? ? ?Diabetic Foot Exam - Simple   ?No data filed ?  ? ?Lab Results  ?Component Value Date  ? WBC 5.8 05/17/2020  ? HGB 14.2 05/17/2020  ? HCT 41.5 05/17/2020  ? PLT 221.0 05/17/2020  ? GLUCOSE 78 05/17/2020  ? CHOL 219 (H) 05/17/2020  ? TRIG 186.0 (H) 05/17/2020  ? HDL 79.70 05/17/2020  ? LDLDIRECT 104.0 04/21/2018  ? LDLCALC 102 (H) 05/17/2020  ? ALT 16 05/17/2020  ? AST 18 05/17/2020  ? NA 138 05/17/2020  ? K 4.3 05/17/2020  ? CL 103 05/17/2020  ? CREATININE 0.77 05/17/2020  ? BUN 20  05/17/2020  ? CO2 25 05/17/2020  ? TSH 1.49 05/11/2019  ? ? ?Lab Results  ?Component Value Date  ?  TSH 1.49 05/11/2019  ? ?Lab Results  ?Component Value Date  ? WBC 5.8 05/17/2020  ? HGB 14.2 05/17/2020  ? HCT 41.5 05/17/2020  ? MCV 98.4 05/17/2020  ? PLT 221.0 05/17/2020  ? ?Lab Results  ?Component Value Date  ? NA 138 05/17/2020  ? K 4.3 05/17/2020  ? CO2 25 05/17/2020  ? GLUCOSE 78 05/17/2020  ? BUN 20 05/17/2020  ? CREATININE 0.77 05/17/2020  ? BILITOT 0.4 05/17/2020  ? ALKPHOS 46 05/17/2020  ? AST 18 05/17/2020  ? ALT 16 05/17/2020  ? PROT 7.1 05/17/2020  ? ALBUMIN 4.4 05/17/2020  ? CALCIUM 9.7 05/17/2020  ? GFR 82.08 05/17/2020  ? ?Lab Results  ?Component Value Date  ? CHOL 219 (H) 05/17/2020  ? ?Lab Results  ?Component Value Date  ? HDL 79.70 05/17/2020  ? ?Lab Results  ?Component Value Date  ? LDLCALC 102 (H) 05/17/2020  ? ?Lab Results  ?Component Value Date  ? TRIG 186.0 (H) 05/17/2020  ? ?Lab Results  ?Component Value Date  ? CHOLHDL 3 05/17/2020  ? ?No results found for: HGBA1C ? ?   ?Assessment & Plan:  ? ?Problem List Items Addressed This Visit   ? ?  ? Unprioritized  ? Hyperlipidemia  ?  Encourage heart healthy diet such as MIND or DASH diet, increase exercise, avoid trans fats, simple carbohydrates and processed foods, consider a krill or fish or flaxseed oil cap daily.  ?  ?  ? Preventative health care - Primary  ?  ghm utd ?Check labs  ?  ?  ? Relevant Orders  ? CBC with Differential/Platelet  ? Comprehensive metabolic panel  ? Lipid panel  ? TSH  ? ?Other Visit Diagnoses   ? ? History of colon polyps      ? Relevant Orders  ? Ambulatory referral to Gastroenterology  ? ?  ? ? ? ? ?No orders of the defined types were placed in this encounter. ? ? ?I, [name], DO, personally preformed the services described in this documentation.  All medical record entries made by the scribe were at my direction and in my presence.  I have reviewed the chart and discharge instructions (if applicable) and  agree that the record reflects my personal performance and is accurate and complete. 05/22/2021 ? ? ?I,Amber Collins,acting as a Education administrator for [employer], DO.,have documented all relevant documentation on the behalf

## 2021-05-23 LAB — LIPID PANEL
Cholesterol: 220 mg/dL — ABNORMAL HIGH (ref 0–200)
HDL: 83.6 mg/dL (ref >39.00)
LDL Cholesterol: 97 mg/dL (ref 0–99)
NonHDL: 136.42
Total CHOL/HDL Ratio: 3
Triglycerides: 197 mg/dL — ABNORMAL HIGH (ref 0.0–149.0)
VLDL: 39.4 mg/dL (ref 0.0–40.0)

## 2021-05-23 LAB — CBC WITH DIFFERENTIAL/PLATELET
Basophils Absolute: 0 10*3/uL (ref 0.0–0.1)
Basophils Relative: 0.8 % (ref 0.0–3.0)
Eosinophils Absolute: 0 10*3/uL (ref 0.0–0.7)
Eosinophils Relative: 0.9 % (ref 0.0–5.0)
HCT: 41.2 % (ref 36.0–46.0)
Hemoglobin: 14 g/dL (ref 12.0–15.0)
Lymphocytes Relative: 35.5 % (ref 12.0–46.0)
Lymphs Abs: 1.9 10*3/uL (ref 0.7–4.0)
MCHC: 34 g/dL (ref 30.0–36.0)
MCV: 100.2 fl — ABNORMAL HIGH (ref 78.0–100.0)
Monocytes Absolute: 0.4 10*3/uL (ref 0.1–1.0)
Monocytes Relative: 7 % (ref 3.0–12.0)
Neutro Abs: 3 10*3/uL (ref 1.4–7.7)
Neutrophils Relative %: 55.8 % (ref 43.0–77.0)
Platelets: 231 10*3/uL (ref 150.0–400.0)
RBC: 4.11 Mil/uL (ref 3.87–5.11)
RDW: 13.4 % (ref 11.5–15.5)
WBC: 5.4 10*3/uL (ref 4.0–10.5)

## 2021-05-23 LAB — COMPREHENSIVE METABOLIC PANEL
ALT: 21 U/L (ref 0–35)
AST: 22 U/L (ref 0–37)
Albumin: 4.4 g/dL (ref 3.5–5.2)
Alkaline Phosphatase: 45 U/L (ref 39–117)
BUN: 15 mg/dL (ref 6–23)
CO2: 24 mEq/L (ref 19–32)
Calcium: 9.7 mg/dL (ref 8.4–10.5)
Chloride: 104 mEq/L (ref 96–112)
Creatinine, Ser: 0.78 mg/dL (ref 0.40–1.20)
GFR: 80.25 mL/min (ref >60.00)
Glucose, Bld: 79 mg/dL (ref 70–99)
Potassium: 4 mEq/L (ref 3.5–5.1)
Sodium: 138 mEq/L (ref 135–145)
Total Bilirubin: 0.5 mg/dL (ref 0.2–1.2)
Total Protein: 6.9 g/dL (ref 6.0–8.3)

## 2021-05-23 LAB — TSH: TSH: 1.65 u[IU]/mL (ref 0.35–5.50)

## 2021-05-28 ENCOUNTER — Encounter: Payer: Self-pay | Admitting: Family Medicine

## 2021-06-07 DIAGNOSIS — Z86010 Personal history of colonic polyps: Secondary | ICD-10-CM | POA: Diagnosis not present

## 2021-06-20 DIAGNOSIS — M5440 Lumbago with sciatica, unspecified side: Secondary | ICD-10-CM | POA: Diagnosis not present

## 2021-07-14 DIAGNOSIS — Z1211 Encounter for screening for malignant neoplasm of colon: Secondary | ICD-10-CM | POA: Diagnosis not present

## 2021-07-14 DIAGNOSIS — Z86010 Personal history of colonic polyps: Secondary | ICD-10-CM | POA: Diagnosis not present

## 2021-07-18 ENCOUNTER — Ambulatory Visit (INDEPENDENT_AMBULATORY_CARE_PROVIDER_SITE_OTHER): Payer: Medicare Other

## 2021-07-18 DIAGNOSIS — Z0000 Encounter for general adult medical examination without abnormal findings: Secondary | ICD-10-CM | POA: Diagnosis not present

## 2021-07-18 NOTE — Progress Notes (Signed)
Subjective:   Ashley Salinas is a 66 y.o. female who presents for Medicare Annual (Subsequent) preventive examination.  I connected with  Ashley Salinas on 07/18/21 by a audio enabled telemedicine application and verified that I am speaking with the correct person using two identifiers.  Patient Location: Home  Provider Location: Office/Clinic  I discussed the limitations of evaluation and management by telemedicine. The patient expressed understanding and agreed to proceed.   Review of Systems     Cardiac Risk Factors include: advanced age (>22mn, >>43women);dyslipidemia     Objective:    There were no vitals filed for this visit. There is no height or weight on file to calculate BMI.     07/18/2021    1:05 PM 12/28/2019    2:05 PM 05/11/2019    2:38 PM 10/24/2016    9:58 AM 02/15/2015    9:47 AM  Advanced Directives  Does Patient Have a Medical Advance Directive? Yes Yes No Yes Yes  Type of [place] DNR (pink MOST or yellow form);Living will [place]  [place]  Does patient want to make changes to medical advance directive? No - Patient declined No - Patient declined   No - Patient declined  Copy of [place] Chart? Yes - validated most recent copy scanned in chart (See row information) No - copy requested  No - copy requested No - copy requested  Would patient like information on creating a medical advance directive?   No - Patient declined      Current Medications (verified) Outpatient Encounter Medications as of 07/18/2021  Medication Sig   acetaminophen (TYLENOL) 325 MG tablet    amoxicillin (AMOXIL) 500 MG capsule 4 capsules (2gm) po 1 hour prior to dental work   Multiple Vitamin (MULTIVITAMIN) capsule Take 1 capsule by mouth daily.   No facility-administered encounter medications on file as  of 07/18/2021.    Allergies (verified) Azithromycin, Oxycodone, and Naproxen   History: Past Medical History:  Diagnosis Date   Depression    Past Surgical History:  Procedure Laterality Date   APPENDECTOMY  1990   BREAST ENHANCEMENT SURGERY     LUMBAR FUSION  03/21/2007   SMALL INTESTINE SURGERY  1990   TOTAL HIP ARTHROPLASTY  10/11/2010,  01/2011   Left- Dr.Moore--- revision   TOTAL HIP ARTHROPLASTY     Left- Dr.Moore   TUBAL LIGATION  2004   Family History  Problem Relation Age of Onset   Breast cancer Mother    Cancer Mother 520      breast   Arthritis Father    Cancer Father        multiple myeloma  stage 3B   Social History   Socioeconomic History   Marital status: Widowed    Spouse name: Not on file   Number of children: Not on file   Years of education: Not on file   Highest education level: Not on file  Occupational History    Comment: unemployed-- looking for another job  Tobacco Use   Smoking status: Some Days    Packs/day: 0.50    Years: 35.00    Pack years: 17.50    Types: Cigarettes    Last attempt to quit: 10/04/2010    Years since quitting: 10.7   Smokeless tobacco: Never   Tobacco comments:    smoking on and  off-- few cig here and there  Substance and Sexual Activity   Alcohol use: Yes    Alcohol/week: 1.0 standard drink    Types: 1 Glasses of wine per week    Comment: rare-- actually < 1x    Drug use: No   Sexual activity: Yes    Partners: Male  Other Topics Concern   Not on file  Social History Narrative   Exercise-  walking some    Social Determinants of Health   Financial Resource Strain: Low Risk    Difficulty of Paying Living Expenses: Not hard at all  Food Insecurity: No Food Insecurity   Worried About Charity fundraiser in the Last Year: Never true   [place] in the Last Year: Never true  Transportation Needs: No Transportation Needs   Lack of Transportation (Medical): No   Lack of Transportation (Non-Medical): No   Physical Activity: Insufficiently Active   Days of Exercise per Week: 4 days   Minutes of Exercise per Session: 30 min  Stress: No Stress Concern Present   Feeling of Stress : Not at all  Social Connections: Socially Integrated   Frequency of Communication with Friends and Family: More than three times a week   Frequency of Social Gatherings with Friends and Family: Three times a week   Attends Religious Services: More than 4 times per year   Active Member of Clubs or Organizations: Yes   Attends Music therapist: More than 4 times per year   Marital Status: Living with partner    Tobacco Counseling Ready to quit: Not Answered Counseling given: Not Answered Tobacco comments: smoking on and off-- few cig here and there   Clinical Intake:  Pre-visit preparation completed: Yes  Pain : No/denies pain     Nutritional Risks: None Diabetes: No  How often do you need to have someone help you when you read instructions, pamphlets, or other written materials from your doctor or pharmacy?: 1 - Never  Diabetic?No  Interpreter Needed?: No  Information entered by :: [place] of Daily Living    07/18/2021    1:06 PM  In your present state of health, do you have any difficulty performing the following activities:  Hearing? 0  Vision? 0  Difficulty concentrating or making decisions? 0  Walking or climbing stairs? 0  Dressing or bathing? 0  Doing errands, shopping? 0  Preparing Food and eating ? N  Using the Toilet? N  In the past six months, have you accidently leaked urine? N  Do you have problems with loss of bowel control? N  Managing your Medications? N  Managing your Finances? N  Housekeeping or managing your Housekeeping? N    Patient Care Team: Carollee Herter, Alferd Apa, DO as PCP - General Remer Macho, MD as Attending Physician (Ophthalmology) Tamela Oddi, DDS as Consulting Physician (Oral Surgery) Kary Kos, MD as  Consulting Physician (Neurosurgery) Reece Agar, MD as Consulting Physician (Pain Medicine)  Indicate any recent Medical Services you may have received from other than [facility] providers in the past year (date may be approximate).     Assessment:   This is a routine wellness examination for Ashley Salinas.  Hearing/Vision screen No results found.  Dietary issues and exercise activities discussed: Current Exercise Habits: Home exercise routine, Type of exercise: Other - see comments (bicycling), Time (Minutes): 10, Frequency (Times/Week): 5, Weekly Exercise (Minutes/Week): 50, Intensity: Mild, Exercise limited by: None identified  Goals Addressed               This Visit's Progress     Decrease smoking (pt-stated)   On track      Depression Screen    07/18/2021    1:05 PM 05/11/2019    2:45 PM 04/22/2018   11:47 AM 04/02/2017    1:11 PM 10/24/2016    9:59 AM 03/26/2016    3:12 PM 02/15/2015    9:48 AM  PHQ 2/9 Scores  PHQ - 2 Score 0 0 0 0 0 0 0  PHQ- 9 Score   0        Fall Risk    07/18/2021    1:05 PM 05/11/2019    2:39 PM 04/02/2017    1:11 PM 10/24/2016    9:59 AM 02/15/2015    9:48 AM  [place] in the past year? 0 0 No No No  Number falls in past yr: 0 0     Injury with Fall? 0 0     Risk for fall due to : No Fall Risks      Follow up Falls evaluation completed Falls prevention discussed;Education provided       [place]:  Any stairs in or around the home? No  If so, are there any without handrails?  N/A Home free of loose throw rugs in walkways, pet beds, electrical cords, etc? Yes  Adequate lighting in your home to reduce risk of falls? Yes   ASSISTIVE DEVICES UTILIZED TO PREVENT FALLS:  Life alert? No  Use of a cane, walker or w/c? No  Grab bars in the bathroom? Yes  Shower chair or bench in shower? Yes  Elevated toilet seat or a handicapped toilet? Yes   TIMED UP AND GO:  Was the test performed? No .     Cognitive Function:        07/18/2021    1:08 PM  6CIT Screen  What Year? 0 points  What month? 0 points  What time? 0 points  Count back from 20 0 points  Months in reverse 0 points  Repeat phrase 0 points  Total Score 0 points    Immunizations Immunization History  Administered Date(s) Administered   H1N1 01/20/2008   Influenza Split 12/04/2019, 12/05/2020   Influenza Whole 12/25/2006, 12/17/2007, 01/20/2009   Influenza,inj,Quad PF,6+ Mos 02/11/2014   Influenza-Unspecified 01/04/2015, 12/05/2015, 12/12/2016, 12/03/2017   PFIZER(Purple Top)SARS-COV-2 Vaccination 05/26/2019, 06/16/2019, 01/05/2020   Pfizer Covid-19 Vaccine Bivalent Booster 6yr & up 12/05/2020   Pneumococcal Polysaccharide-23 02/02/2020   Td 12/25/2006, 10/27/2016   Zoster Recombinat (Shingrix) 12/12/2016, 05/15/2017    TDAP status: Up to date  Flu Vaccine status: Up to date  Pneumococcal vaccine status: Up to date  Covid-19 vaccine status: Completed vaccines  Qualifies for Shingles Vaccine? Yes   Zostavax completed No   Shingrix Completed?: Yes  Screening Tests Health Maintenance  Topic Date Due   COLONOSCOPY (Pts [phone] coverage will need to be confirmed)  03/05/2021   PAP SMEAR-Modifier  04/21/2021   INFLUENZA VACCINE  10/03/2021   MAMMOGRAM  03/01/2022   TETANUS/TDAP  10/28/2026   COVID-19 Vaccine  Completed   Hepatitis C Screening  Completed   HIV Screening  Completed   Zoster Vaccines- Shingrix  Completed   HPV VACCINES  Aged Out    Health Maintenance  Health Maintenance Due  Topic Date Due   COLONOSCOPY (Pts [phone] coverage will  need to be confirmed)  03/05/2021   PAP SMEAR-Modifier  04/21/2021    Colorectal cancer screening: Type of screening: Colonoscopy. Completed 07/14/21. Repeat every 5 years  Mammogram status: Completed 03/01/21. Repeat every year  Bone Density status: Ordered declined. Pt provided with contact info and advised to call to  schedule appt.  Lung Cancer Screening: (Low Dose CT Chest recommended if Age 64-80 years, 30 pack-year currently smoking OR have quit w/in 15years.) does qualify.   Lung Cancer Screening Referral: declined  Additional Screening:  Hepatitis C Screening: does qualify; Completed 02/15/15  Vision Screening: Recommended annual ophthalmology exams for early detection of glaucoma and other disorders of the eye. Is the patient up to date with their annual eye exam?  Yes  Who is the provider or what is the name of the office in which the patient attends annual eye exams? [facility] If pt is not established with a provider, would they like to be referred to a provider to establish care? No .   Dental Screening: Recommended annual dental exams for proper oral hygiene  Community Resource Referral / Chronic Care Management: CRR required this visit?  No   CCM required this visit?  No      Plan:     I have personally reviewed and noted the following in the patient's chart:   Medical and social history Use of alcohol, tobacco or illicit drugs  Current medications and supplements including opioid prescriptions.  Functional ability and status Nutritional status Physical activity Advanced directives List of other physicians Hospitalizations, surgeries, and ER visits in previous 12 months Vitals Screenings to include cognitive, depression, and falls Referrals and appointments  In addition, I have reviewed and discussed with patient certain preventive protocols, quality metrics, and best practice recommendations. A written personalized care plan for preventive services as well as general preventive health recommendations were provided to patient.   Due to this being a telephonic visit, the after visit summary with patients personalized plan was offered to patient via mail or my-chart. Patient would like to access on my-chart.   Duard Brady Cassadi Purdie, [place]   07/18/2021   Nurse Notes:  none

## 2021-07-18 NOTE — Patient Instructions (Signed)
Ms. Govan , ?Thank you for taking time to come for your Medicare Wellness Visit. I appreciate your ongoing commitment to your health goals. Please review the following plan we discussed and let me know if I can assist you in the future.  ? ?Screening recommendations/referrals: ?Colonoscopy: 07/14/21 due 07/15/26 ?Mammogram: 03/01/21 due 03/01/22 ?Bone Density: declined order ?Recommended yearly ophthalmology/optometry visit for glaucoma screening and checkup ?Recommended yearly dental visit for hygiene and checkup ? ?Vaccinations: ?Influenza vaccine: up to date ?Pneumococcal vaccine: up to date ?Tdap vaccine: up to date ?Shingles vaccine: up to date   ?Covid-19:completed ? ?Advanced directives: yes, not on file ? ?Conditions/risks identified: see problem list ? ?Next appointment: Follow up in one year for your annual wellness visit  ? ? ?Preventive Care 25 Years and Older, Female ?Preventive care refers to lifestyle choices and visits with your health care provider that can promote health and wellness. ?What does preventive care include? ?A yearly physical exam. This is also called an annual well check. ?Dental exams once or twice a year. ?Routine eye exams. Ask your health care provider how often you should have your eyes checked. ?Personal lifestyle choices, including: ?Daily care of your teeth and gums. ?Regular physical activity. ?Eating a healthy diet. ?Avoiding tobacco and drug use. ?Limiting alcohol use. ?Practicing safe sex. ?Taking low-dose aspirin every day. ?Taking vitamin and mineral supplements as recommended by your health care provider. ?What happens during an annual well check? ?The services and screenings done by your health care provider during your annual well check will depend on your age, overall health, lifestyle risk factors, and family history of disease. ?Counseling  ?Your health care provider may ask you questions about your: ?Alcohol use. ?Tobacco use. ?Drug use. ?Emotional  well-being. ?Home and relationship well-being. ?Sexual activity. ?Eating habits. ?History of falls. ?Memory and ability to understand (cognition). ?Work and work Statistician. ?Reproductive health. ?Screening  ?You may have the following tests or measurements: ?Height, weight, and BMI. ?Blood pressure. ?Lipid and cholesterol levels. These may be checked every 5 years, or more frequently if you are over 35 years old. ?Skin check. ?Lung cancer screening. You may have this screening every year starting at age 57 if you have a 30-pack-year history of smoking and currently smoke or have quit within the past 15 years. ?Fecal occult blood test (FOBT) of the stool. You may have this test every year starting at age 13. ?Flexible sigmoidoscopy or colonoscopy. You may have a sigmoidoscopy every 5 years or a colonoscopy every 10 years starting at age 17. ?Hepatitis C blood test. ?Hepatitis B blood test. ?Sexually transmitted disease (STD) testing. ?Diabetes screening. This is done by checking your blood sugar (glucose) after you have not eaten for a while (fasting). You may have this done every 1-3 years. ?Bone density scan. This is done to screen for osteoporosis. You may have this done starting at age 33. ?Mammogram. This may be done every 1-2 years. Talk to your health care provider about how often you should have regular mammograms. ?Talk with your health care provider about your test results, treatment options, and if necessary, the need for more tests. ?Vaccines  ?Your health care provider may recommend certain vaccines, such as: ?Influenza vaccine. This is recommended every year. ?Tetanus, diphtheria, and acellular pertussis (Tdap, Td) vaccine. You may need a Td booster every 10 years. ?Zoster vaccine. You may need this after age 72. ?Pneumococcal 13-valent conjugate (PCV13) vaccine. One dose is recommended after age 27. ?Pneumococcal polysaccharide (PPSV23) vaccine. One  dose is recommended after age 9. ?Talk to your  health care provider about which screenings and vaccines you need and how often you need them. ?This information is not intended to replace advice given to you by your health care provider. Make sure you discuss any questions you have with your health care provider. ?Document Released: 03/18/2015 Document Revised: 11/09/2015 Document Reviewed: 12/21/2014 ?Elsevier Interactive Patient Education ? 2017 [place]. ? ?Fall Prevention in the Home ?Falls can cause injuries. They can happen to people of all ages. There are many things you can do to make your home safe and to help prevent falls. ?What can I do on the outside of my home? ?Regularly fix the edges of walkways and driveways and fix any cracks. ?Remove anything that might make you trip as you walk through a door, such as a raised step or threshold. ?Trim any bushes or trees on the path to your home. ?Use bright outdoor lighting. ?Clear any walking paths of anything that might make someone trip, such as rocks or tools. ?Regularly check to see if handrails are loose or broken. Make sure that both sides of any steps have handrails. ?Any raised decks and porches should have guardrails on the edges. ?Have any leaves, snow, or ice cleared regularly. ?Use sand or salt on walking paths during winter. ?Clean up any spills in your garage right away. This includes oil or grease spills. ?What can I do in the bathroom? ?Use night lights. ?Install grab bars by the toilet and in the tub and shower. Do not use towel bars as grab bars. ?Use non-skid mats or decals in the tub or shower. ?If you need to sit down in the shower, use a plastic, non-slip stool. ?Keep the floor dry. Clean up any water that spills on the floor as soon as it happens. ?Remove soap buildup in the tub or shower regularly. ?Attach bath mats securely with double-sided non-slip rug tape. ?Do not have throw rugs and other things on the floor that can make you trip. ?What can I do in the bedroom? ?Use night  lights. ?Make sure that you have a light by your bed that is easy to reach. ?Do not use any sheets or blankets that are too big for your bed. They should not hang down onto the floor. ?Have a firm chair that has side arms. You can use this for support while you get dressed. ?Do not have throw rugs and other things on the floor that can make you trip. ?What can I do in the kitchen? ?Clean up any spills right away. ?Avoid walking on wet floors. ?Keep items that you use a lot in easy-to-reach places. ?If you need to reach something above you, use a strong step stool that has a grab bar. ?Keep electrical cords out of the way. ?Do not use floor polish or wax that makes floors slippery. If you must use wax, use non-skid floor wax. ?Do not have throw rugs and other things on the floor that can make you trip. ?What can I do with my stairs? ?Do not leave any items on the stairs. ?Make sure that there are handrails on both sides of the stairs and use them. Fix handrails that are broken or loose. Make sure that handrails are as long as the stairways. ?Check any carpeting to make sure that it is firmly attached to the stairs. Fix any carpet that is loose or worn. ?Avoid having throw rugs at the top or bottom of the  stairs. If you do have throw rugs, attach them to the floor with carpet tape. ?Make sure that you have a light switch at the top of the stairs and the bottom of the stairs. If you do not have them, ask someone to add them for you. ?What else can I do to help prevent falls? ?Wear shoes that: ?Do not have high heels. ?Have rubber bottoms. ?Are comfortable and fit you well. ?Are closed at the toe. Do not wear sandals. ?If you use a stepladder: ?Make sure that it is fully opened. Do not climb a closed stepladder. ?Make sure that both sides of the stepladder are locked into place. ?Ask someone to hold it for you, if possible. ?Clearly mark and make sure that you can see: ?Any grab bars or handrails. ?First and last  steps. ?Where the edge of each step is. ?Use tools that help you move around (mobility aids) if they are needed. These include: ?Canes. ?Walkers. ?Scooters. ?Crutches. ?Turn on the lights when you go into a dark area. Replace

## 2021-08-29 ENCOUNTER — Telehealth: Payer: Medicare Other | Admitting: Family Medicine

## 2021-11-20 IMAGING — DX DG LUMBAR SPINE COMPLETE 4+V
5 series · 5 of 5 positions shown · non-contrast
Comparison: None.

CLINICAL DATA: Low back pain

EXAM:
LUMBAR SPINE - COMPLETE 4+ VIEW

[l-spine ap]
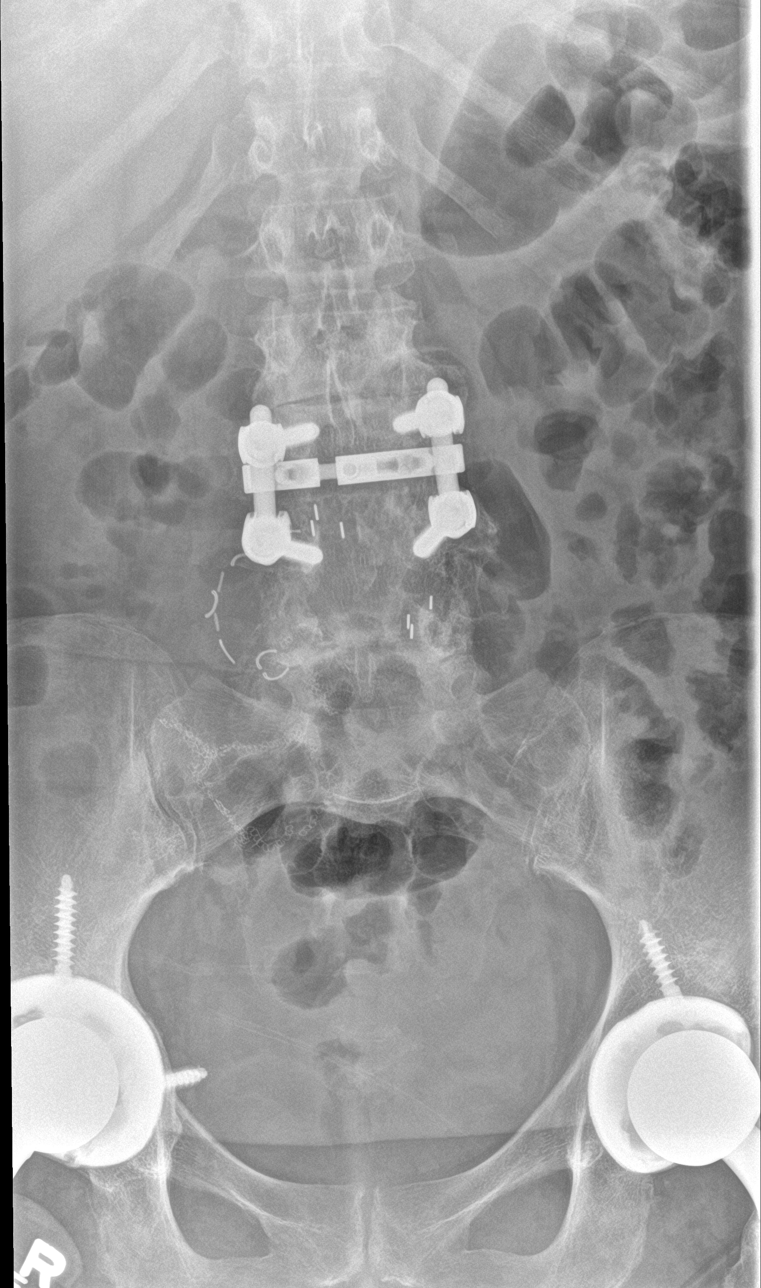

[l-spine obl (1 of 2)]
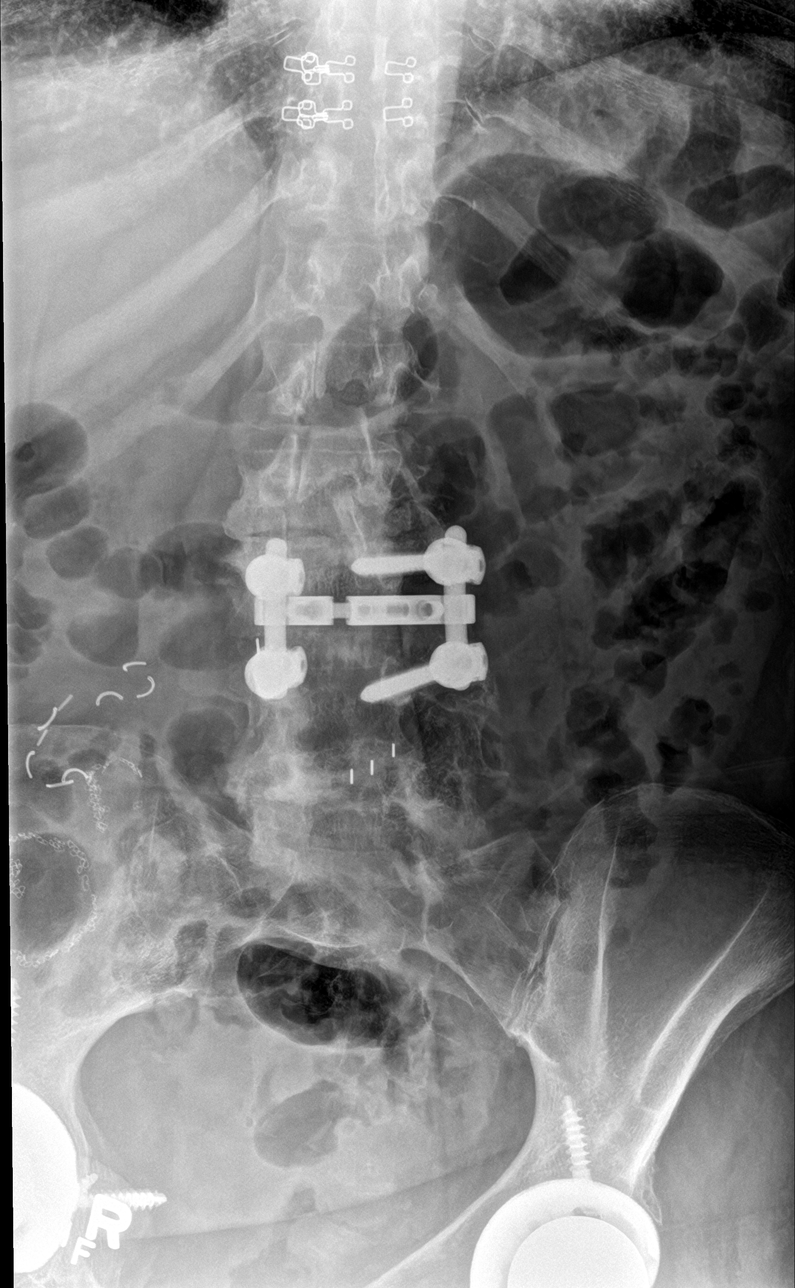

[l-spine obl (2 of 2)]
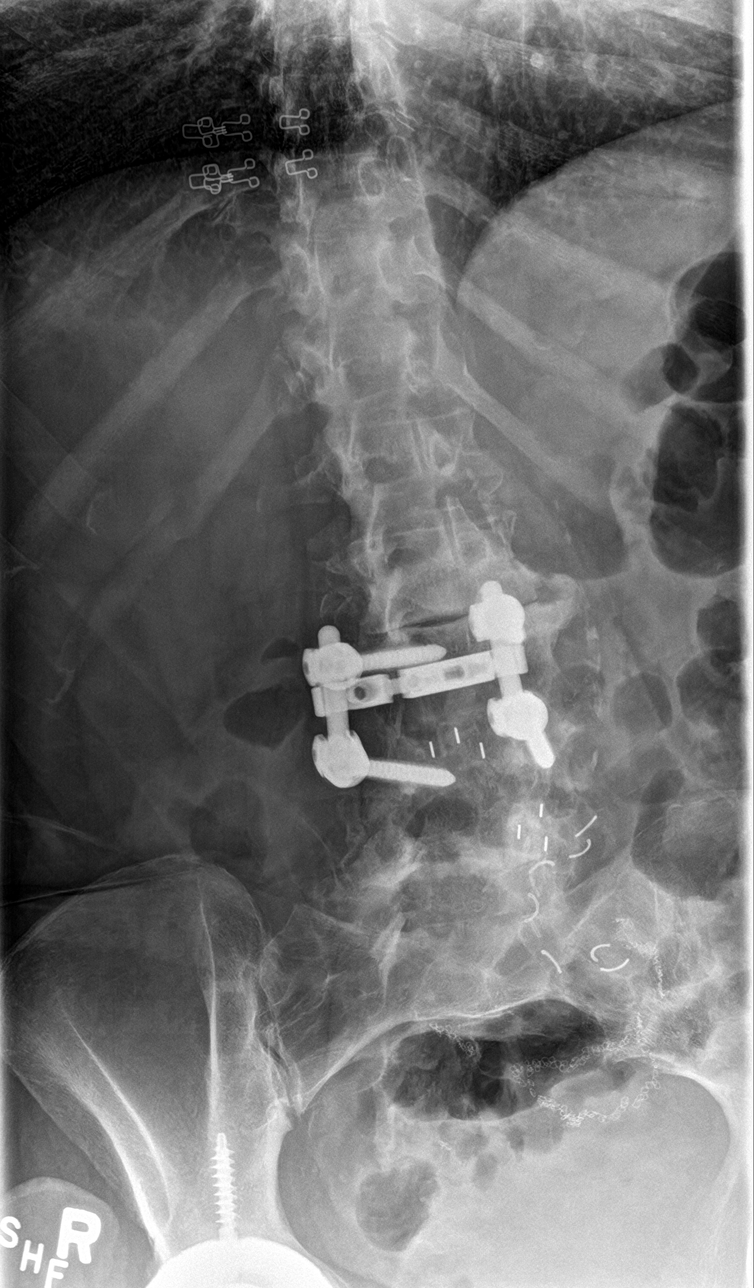

[l-spine lat]
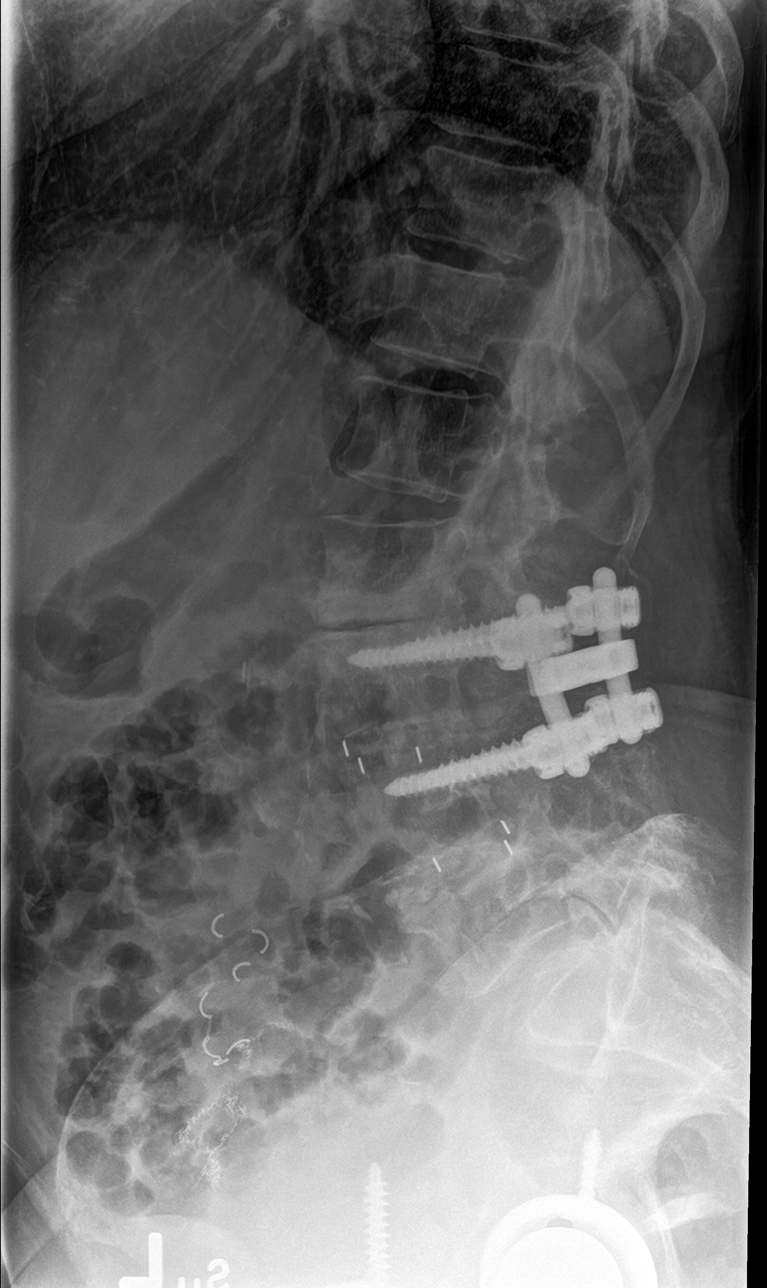

[l-spine spot]
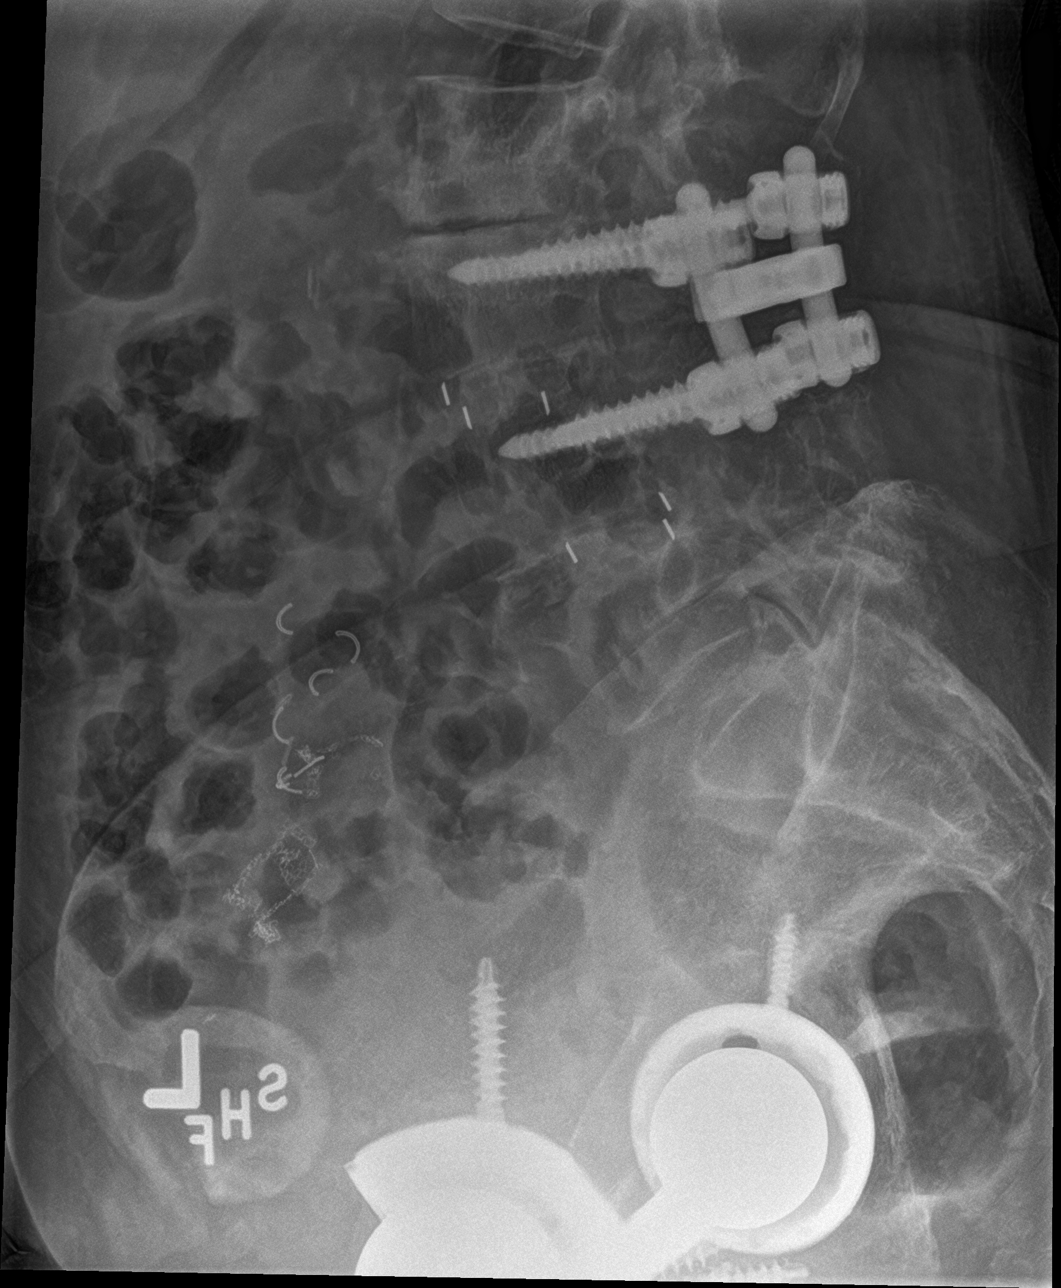

[5 of 5 positions shown; findings below may reference images not displayed]

FINDINGS: Five view radiograph lumbar spine. Normal lumbar lordosis. Anterior
and posterior lumbar fusion of L3-4 has been performed with
bilateral pedicle screws and posterior bars. Resection of the L4
spinous process has been performed. Anterior discectomy with
interbody bone cage noted at L4-5. No acute fracture or listhesis of
the lumbar spine. Vertebral body height is preserved. There is
marked intervertebral disc space narrowing and subchondral sclerosis
at L2-3 in keeping with changes of severe degenerative disc disease
at this level. Paraspinal soft tissues are unremarkable.
IMPRESSION: No acute fracture or listhesis.

Postsurgical changes of L3-L5 fusion and L4 posterior decompression.

Severe degenerative disc disease L2-3.

## 2021-12-12 DIAGNOSIS — M5416 Radiculopathy, lumbar region: Secondary | ICD-10-CM | POA: Diagnosis not present

## 2021-12-12 DIAGNOSIS — M5440 Lumbago with sciatica, unspecified side: Secondary | ICD-10-CM | POA: Diagnosis not present

## 2021-12-25 DIAGNOSIS — M5416 Radiculopathy, lumbar region: Secondary | ICD-10-CM | POA: Diagnosis not present

## 2021-12-27 ENCOUNTER — Encounter: Payer: Self-pay | Admitting: Family Medicine

## 2022-01-16 ENCOUNTER — Encounter: Payer: Self-pay | Admitting: Family Medicine

## 2022-01-16 ENCOUNTER — Ambulatory Visit (INDEPENDENT_AMBULATORY_CARE_PROVIDER_SITE_OTHER): Payer: Medicare Other | Admitting: Family Medicine

## 2022-01-16 VITALS — BP 160/100 | HR 91 | Temp 98.1°F | Resp 18 | Ht 66.0 in | Wt 161.0 lb

## 2022-01-16 DIAGNOSIS — J40 Bronchitis, not specified as acute or chronic: Secondary | ICD-10-CM | POA: Diagnosis not present

## 2022-01-16 DIAGNOSIS — J0140 Acute pansinusitis, unspecified: Secondary | ICD-10-CM | POA: Insufficient documentation

## 2022-01-16 DIAGNOSIS — Z96643 Presence of artificial hip joint, bilateral: Secondary | ICD-10-CM

## 2022-01-16 MED ORDER — PREDNISONE 10 MG PO TABS: ORAL_TABLET | ORAL | 0 refills | Status: DC

## 2022-01-16 MED ORDER — FLUTICASONE PROPIONATE 50 MCG/ACT NA SUSP: 2.0000 | Freq: Every day | NASAL | 6 refills | Status: DC

## 2022-01-16 MED ORDER — METHYLPREDNISOLONE ACETATE 80 MG/ML IJ SUSP
80.0000 mg | Freq: Once | INTRAMUSCULAR | Status: AC
  Administered 2022-01-16: 80 mg via INTRAMUSCULAR

## 2022-01-16 MED ORDER — BENZONATATE 100 MG PO CAPS: 200.0000 mg | ORAL_CAPSULE | Freq: Three times a day (TID) | ORAL | 0 refills | Status: DC | PRN

## 2022-01-16 MED ORDER — AMOXICILLIN-POT CLAVULANATE 875-125 MG PO TABS: 1.0000 | ORAL_TABLET | Freq: Two times a day (BID) | ORAL | 0 refills | Status: DC

## 2022-01-16 MED ORDER — AMOXICILLIN 500 MG PO CAPS: ORAL_CAPSULE | ORAL | 1 refills | Status: DC

## 2022-01-16 NOTE — Addendum Note (Signed)
Addended by: Sanda Linger on: 01/16/2022 04:56 PM   Modules accepted: Orders

## 2022-01-16 NOTE — Patient Instructions (Signed)

## 2022-01-16 NOTE — Progress Notes (Signed)
Subjective:   By signing my name below, I, [name], attest that this documentation has been prepared under the direction and in the presence of [name], DO. 01/16/2022    Patient ID: Ashley Salinas, female    DOB: August 08, 1955, 66 y.o.   MRN: [id number]  Chief Complaint  Patient presents with   Cough    Sxs started Thursday last week, otc meds not helping. Negative COVID on Thursday. Pt states having productive cough and sinus congestion and mucus.     Cough Pertinent negatives include no chest pain, chills, fever, headaches, heartburn, myalgias, rash or shortness of breath. There is no history of environmental allergies.   Patient is in today for a office visit.  She complains of a congestion and cough for the past week. She also has pressure in her sinuses. She is coughing up green mucous. She denies having any fever. She has not needed to use her inhaler. She is taking dayquil, nyquil, and mucinex to manage her symptoms and reports relief. She has tested negative for Covid-19 last Thursday.  Her blood pressure is elevated during this visit.  BP Readings from Last 3 Encounters:  01/16/22 (!) 160/100  05/22/21 140/90  05/17/20 124/84   Pulse Readings from Last 3 Encounters:  01/16/22 91  05/22/21 76  05/17/20 71    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,  01/2011   Left- Dr.Moore--- revision   TOTAL HIP ARTHROPLASTY     Left- Dr.Moore   TUBAL LIGATION  2004    Family History  Problem Relation Age of Onset   Breast cancer Mother    Cancer Mother 76       breast   Arthritis Father    Cancer Father        multiple myeloma  stage 3B    Social History   Socioeconomic History   Marital status: Widowed    Spouse name: Not on file    Number of children: Not on file   Years of education: Not on file   Highest education level: Not on file  Occupational History    Comment: unemployed-- looking for another job  Tobacco Use   Smoking status: Some Days    Packs/day: 0.50    Years: 35.00    Total pack years: 17.50    Types: Cigarettes    Last attempt to quit: 10/04/2010    Years since quitting: 11.2   Smokeless tobacco: Never   Tobacco comments:    smoking on and off-- few cig here and there  Substance and Sexual Activity   Alcohol use: Yes    Alcohol/week: 1.0 standard drink of alcohol    Types: 1 Glasses of wine per week    Comment: rare-- actually < 1x    Drug use: No   Sexual activity: Yes    Partners: Male  Other Topics Concern   Not on file  Social History Narrative   Exercise-  walking some    Social Determinants of Health   Financial Resource Strain: Low Risk  (07/18/2021)   Overall Financial Resource Strain (CARDIA)    Difficulty of Paying Living Expenses: Not hard at all  Food Insecurity: No Food Insecurity (07/18/2021)  Hunger Vital Sign    Worried About Running Out of Food in the Last Year: Never true    Ran Out of Food in the Last Year: Never true  Transportation Needs: No Transportation Needs (07/18/2021)   PRAPARE - Hydrologist (Medical): No    Lack of Transportation (Non-Medical): No  Physical Activity: Insufficiently Active (07/18/2021)   Exercise Vital Sign    Days of Exercise per Week: 4 days    Minutes of Exercise per Session: 30 min  Stress: No Stress Concern Present (07/18/2021)   [place]    Feeling of Stress : Not at all  Social Connections: [place] (07/18/2021)   Social Connection and Isolation Panel [NHANES]    Frequency of Communication with Friends and Family: More than three times a week    Frequency of Social Gatherings with Friends and Family: Three times a week     Attends Religious Services: More than 4 times per year    Active Member of Clubs or Organizations: Yes    Attends Archivist Meetings: More than 4 times per year    Marital Status: Living with partner  Intimate Partner Violence: Not At Risk (07/18/2021)   Humiliation, Afraid, Rape, and Kick questionnaire    Fear of Current or Ex-Partner: No    Emotionally Abused: No    Physically Abused: No    Sexually Abused: No    Outpatient Medications Prior to Visit  Medication Sig Dispense Refill   acetaminophen (TYLENOL) 325 MG tablet      Multiple Vitamin (MULTIVITAMIN) capsule Take 1 capsule by mouth daily.     amoxicillin (AMOXIL) 500 MG capsule 4 capsules (2gm) po 1 hour prior to dental work 12 capsule 1   No facility-administered medications prior to visit.    Allergies  Allergen Reactions   Azithromycin Nausea Only   Oxycodone Nausea Only   Naproxen Rash    Review of Systems  Constitutional:  Negative for chills, fever and malaise/fatigue.  HENT:  Positive for congestion and sinus pain. Negative for hearing loss.   Eyes:  Negative for discharge.  Respiratory:  Positive for cough and sputum production (green). Negative for shortness of breath.   Cardiovascular:  Negative for chest pain, palpitations and leg swelling.  Gastrointestinal:  Negative for abdominal pain, blood in stool, constipation, diarrhea, heartburn, nausea and vomiting.  Genitourinary:  Negative for dysuria, frequency, hematuria and urgency.  Musculoskeletal:  Negative for back pain, falls and myalgias.  Skin:  Negative for rash.  Neurological:  Negative for dizziness, sensory change, loss of consciousness, weakness and headaches.  Endo/Heme/Allergies:  Negative for environmental allergies. Does not bruise/bleed easily.  Psychiatric/Behavioral:  Negative for depression and suicidal ideas. The patient is not nervous/anxious and does not have insomnia.        Objective:    Physical Exam Vitals and  nursing note reviewed.  Constitutional:      General: She is not in acute distress.    Appearance: Normal appearance. She is not ill-appearing.  HENT:     Head: Normocephalic and atraumatic.     Right Ear: External ear normal.     Left Ear: External ear normal.     Nose:     Right Sinus: Maxillary sinus tenderness and frontal sinus tenderness present.     Left Sinus: Maxillary sinus tenderness and frontal sinus tenderness present.  Eyes:     Extraocular Movements: Extraocular movements intact.  Pupils: Pupils are equal, round, and reactive to light.  Cardiovascular:     Rate and Rhythm: Normal rate and regular rhythm.     Heart sounds: Normal heart sounds. No murmur heard.    No gallop.  Pulmonary:     Effort: Pulmonary effort is normal. No respiratory distress.     Breath sounds: Normal breath sounds. No wheezing or rales.  Lymphadenopathy:     Cervical: Cervical adenopathy (left side) present.  Skin:    General: Skin is warm and dry.  Neurological:     Mental Status: She is alert and oriented to person, place, and time.  Psychiatric:        Judgment: Judgment normal.     BP (!) 160/100 (BP Location: Left Arm, Patient Position: Sitting, Cuff Size: Normal)   Pulse 91   Temp 98.1 F (36.7 C) (Oral)   Resp 18   Ht _0  (1.676 m)   Wt 161 lb (73 kg)   SpO2 98%   BMI 25.99 kg/m  Wt Readings from Last 3 Encounters:  01/16/22 161 lb (73 kg)  05/22/21 163 lb (73.9 kg)  05/17/20 170 lb 12.8 oz (77.5 kg)    Diabetic Foot Exam - Simple   No data filed    Lab Results  Component Value Date   WBC 5.4 05/22/2021   HGB 14.0 05/22/2021   HCT 41.2 05/22/2021   PLT 231.0 05/22/2021   GLUCOSE 79 05/22/2021   CHOL 220 (H) 05/22/2021   TRIG 197.0 (H) 05/22/2021   HDL 83.60 05/22/2021   LDLDIRECT 104.0 04/21/2018   LDLCALC 97 05/22/2021   ALT 21 05/22/2021   AST 22 05/22/2021   NA 138 05/22/2021   K 4.0 05/22/2021   CL 104 05/22/2021   CREATININE 0.78 05/22/2021    BUN 15 05/22/2021   CO2 24 05/22/2021   TSH 1.65 05/22/2021    Lab Results  Component Value Date   TSH 1.65 05/22/2021   Lab Results  Component Value Date   WBC 5.4 05/22/2021   HGB 14.0 05/22/2021   HCT 41.2 05/22/2021   MCV 100.2 (H) 05/22/2021   PLT 231.0 05/22/2021   Lab Results  Component Value Date   NA 138 05/22/2021   K 4.0 05/22/2021   CO2 24 05/22/2021   GLUCOSE 79 05/22/2021   BUN 15 05/22/2021   CREATININE 0.78 05/22/2021   BILITOT 0.5 05/22/2021   ALKPHOS 45 05/22/2021   AST 22 05/22/2021   ALT 21 05/22/2021   PROT 6.9 05/22/2021   ALBUMIN 4.4 05/22/2021   CALCIUM 9.7 05/22/2021   GFR 80.25 05/22/2021   Lab Results  Component Value Date   CHOL 220 (H) 05/22/2021   Lab Results  Component Value Date   HDL 83.60 05/22/2021   Lab Results  Component Value Date   LDLCALC 97 05/22/2021   Lab Results  Component Value Date   TRIG 197.0 (H) 05/22/2021   Lab Results  Component Value Date   CHOLHDL 3 05/22/2021   No results found for: "HGBA1C"     Assessment & Plan:   Problem List Items Addressed This Visit       Unprioritized   Acute non-recurrent pansinusitis - Primary    Augmentin 875 mg bid x 10 days  Flonase  Depo medrol 80 mg IM Pred taper if not significantly better over the weekend       Relevant Medications   amoxicillin (AMOXIL) 500 MG capsule   amoxicillin-clavulanate (AUGMENTIN) 875-125 MG  tablet   fluticasone (FLONASE) 50 MCG/ACT nasal spray   predniSONE (DELTASONE) 10 MG tablet   benzonatate (TESSALON PERLES) 100 MG capsule   Other Visit Diagnoses     History of bilateral hip replacements       Relevant Medications   amoxicillin (AMOXIL) 500 MG capsule   Bronchitis       Relevant Medications   amoxicillin-clavulanate (AUGMENTIN) 875-125 MG tablet   benzonatate (TESSALON PERLES) 100 MG capsule        Meds ordered this encounter  Medications   amoxicillin (AMOXIL) 500 MG capsule    Sig: 4 capsules (2gm) po 1  hour prior to dental work    Dispense:  12 capsule    Refill:  1   amoxicillin-clavulanate (AUGMENTIN) 875-125 MG tablet    Sig: Take 1 tablet by mouth 2 (two) times daily.    Dispense:  20 tablet    Refill:  0   fluticasone (FLONASE) 50 MCG/ACT nasal spray    Sig: Place 2 sprays into both nostrils daily.    Dispense:  16 g    Refill:  6   predniSONE (DELTASONE) 10 MG tablet    Sig: TAKE 3 TABLETS PO QD FOR 3 DAYS THEN TAKE 2 TABLETS PO QD FOR 3 DAYS THEN TAKE 1 TABLET PO QD FOR 3 DAYS THEN TAKE 1/2 TAB PO QD FOR 3 DAYS    Dispense:  20 tablet    Refill:  0   benzonatate (TESSALON PERLES) 100 MG capsule    Sig: Take 2 capsules (200 mg total) by mouth 3 (three) times daily as needed for cough.    Dispense:  30 capsule    Refill:  0    I, [name], DO, personally preformed the services described in this documentation.  All medical record entries made by the scribe were at my direction and in my presence.  I have reviewed the chart and discharge instructions (if applicable) and agree that the record reflects my personal performance and is accurate and complete. 01/16/2022   I,[name],acting as a scribe for [name], DO.,have documented all relevant documentation on the behalf of [name], DO,as directed by  [name], DO while in the presence of [name], DO.   [name], DO

## 2022-01-16 NOTE — Assessment & Plan Note (Signed)
Augmentin 875 mg bid x 10 days  Flonase  Depo medrol 80 mg IM Pred taper if not significantly better over the weekend

## 2022-01-19 ENCOUNTER — Ambulatory Visit: Payer: Medicare Other

## 2022-01-30 DIAGNOSIS — M5416 Radiculopathy, lumbar region: Secondary | ICD-10-CM | POA: Diagnosis not present

## 2022-01-31 ENCOUNTER — Ambulatory Visit (INDEPENDENT_AMBULATORY_CARE_PROVIDER_SITE_OTHER): Payer: Medicare Other

## 2022-01-31 DIAGNOSIS — I10 Essential (primary) hypertension: Secondary | ICD-10-CM | POA: Diagnosis not present

## 2022-01-31 MED ORDER — LOSARTAN POTASSIUM 50 MG PO TABS: ORAL_TABLET | ORAL | 1 refills | Status: DC

## 2022-01-31 NOTE — Progress Notes (Signed)
Pt here for Blood pressure check per [facility]  Pt currently takes: N/A Pt had high bp at last visit.     BP today @ = 160/100 HR = 84 BP Readings from Last 3 Encounters:  01/16/22 (!) 160/100  05/22/21 140/90  05/17/20 124/84     Recheck:  BP 160/98 HR--85   Pt advised per Dr. Lorelei Pont to start Losartan 50 MG and begin with taking a half tablet (25 MG) until following up Dr. Etter Sjogren nurse visit. Pt agreed. Medication sent in and nurse appt scheduled.

## 2022-02-01 ENCOUNTER — Encounter: Payer: Self-pay | Admitting: Family Medicine

## 2022-02-01 ENCOUNTER — Other Ambulatory Visit: Payer: Self-pay

## 2022-02-01 DIAGNOSIS — Z780 Asymptomatic menopausal state: Secondary | ICD-10-CM

## 2022-02-01 DIAGNOSIS — Z13820 Encounter for screening for osteoporosis: Secondary | ICD-10-CM

## 2022-02-14 ENCOUNTER — Other Ambulatory Visit (INDEPENDENT_AMBULATORY_CARE_PROVIDER_SITE_OTHER): Payer: Medicare Other

## 2022-02-14 ENCOUNTER — Ambulatory Visit (INDEPENDENT_AMBULATORY_CARE_PROVIDER_SITE_OTHER): Payer: Medicare Other

## 2022-02-14 ENCOUNTER — Encounter: Payer: Self-pay | Admitting: Family Medicine

## 2022-02-14 DIAGNOSIS — I10 Essential (primary) hypertension: Secondary | ICD-10-CM | POA: Diagnosis not present

## 2022-02-14 LAB — BASIC METABOLIC PANEL
BUN: 15 mg/dL (ref 6–23)
CO2: 25 mEq/L (ref 19–32)
Calcium: 10 mg/dL (ref 8.4–10.5)
Chloride: 105 mEq/L (ref 96–112)
Creatinine, Ser: 0.83 mg/dL (ref 0.40–1.20)
GFR: 74.1 mL/min (ref >60.00)
Glucose, Bld: 87 mg/dL (ref 70–99)
Potassium: 4.2 mEq/L (ref 3.5–5.1)
Sodium: 142 mEq/L (ref 135–145)

## 2022-02-14 NOTE — Progress Notes (Signed)
Pt here for Blood pressure check per [facility]  Pt currently takes: Losartan 25 MG daily. Pt states taking meds around 4 in the afternoon. Has not taken today.   Pt reports compliance with medication.  BP today @ = 142/100 HR = 83  BP recheck: 144/102 HR--78    Pt advised per Dr. Nani Ravens to increase medication to a whole tablet and check BMP today. Follow up after lab results.

## 2022-02-26 ENCOUNTER — Encounter: Payer: Self-pay | Admitting: Family Medicine

## 2022-03-01 MED ORDER — VALSARTAN 80 MG PO TABS: 80.0000 mg | ORAL_TABLET | Freq: Every day | ORAL | 2 refills | Status: DC

## 2022-03-06 MED ORDER — LISINOPRIL 20 MG PO TABS: 20.0000 mg | ORAL_TABLET | Freq: Every day | ORAL | 0 refills | Status: DC

## 2022-03-06 NOTE — Addendum Note (Signed)
Addended byDamita Dunnings D on: 03/06/2022 12:34 PM   Modules accepted: Orders

## 2022-03-07 ENCOUNTER — Encounter: Payer: Self-pay | Admitting: Family Medicine

## 2022-03-07 DIAGNOSIS — Z9882 Breast implant status: Secondary | ICD-10-CM | POA: Diagnosis not present

## 2022-03-07 DIAGNOSIS — Z1231 Encounter for screening mammogram for malignant neoplasm of breast: Secondary | ICD-10-CM | POA: Diagnosis not present

## 2022-03-07 LAB — HM MAMMOGRAPHY

## 2022-03-08 ENCOUNTER — Encounter: Payer: Self-pay | Admitting: Family Medicine

## 2022-03-20 ENCOUNTER — Encounter: Payer: Self-pay | Admitting: Family Medicine

## 2022-03-20 ENCOUNTER — Ambulatory Visit (INDEPENDENT_AMBULATORY_CARE_PROVIDER_SITE_OTHER): Payer: Medicare Other | Admitting: Family Medicine

## 2022-03-20 VITALS — BP 162/90 | HR 78 | Temp 98.3°F | Resp 18 | Ht 66.0 in

## 2022-03-20 DIAGNOSIS — I10 Essential (primary) hypertension: Secondary | ICD-10-CM | POA: Diagnosis not present

## 2022-03-20 MED ORDER — LISINOPRIL 40 MG PO TABS: 40.0000 mg | ORAL_TABLET | Freq: Every day | ORAL | 1 refills | Status: DC

## 2022-03-20 NOTE — Patient Instructions (Signed)

## 2022-03-20 NOTE — Progress Notes (Signed)
Subjective:   By signing my name below, I, Daiva Huge, attest that this documentation has been prepared under the direction and in the presence of [name], DO 03/20/22   Patient ID: Ashley Salinas, female    DOB: [birth year], 67 y.o.   MRN: [id number]  No chief complaint on file.   HPI Patient is in today for an office visit to f/u bp.   No complaints   BP is running high. She is checking it regularly at home. BP Readings from Last 3 Encounters:  03/20/22 (!) 162/90  01/16/22 (!) 160/100  05/22/21 140/90   She is experiencing negative side effects from Losartan. She has had a mild dry cough while taking Lisinopril.    Past Medical History:  Diagnosis Date  . Depression     Past Surgical History:  Procedure Laterality Date  . APPENDECTOMY  1990  . BREAST ENHANCEMENT SURGERY    . LUMBAR FUSION  03/21/2007  . SMALL INTESTINE SURGERY  1990  . 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 TOTAL HIP ARTHROPLASTY     Left- Dr.Moore  . TUBAL LIGATION  2004    Family History  Problem Relation Age of Onset  . Breast cancer Mother   . Cancer Mother 12       breast  . Arthritis Father   . Cancer Father        multiple myeloma  stage 3B    Social History   Socioeconomic History  . Marital status: Widowed    Spouse name: Not on file  . Number of children: Not on file  . Years of education: Not on file  . Highest education level: Not on file  Occupational History    Comment: unemployed-- looking for another job  Tobacco Use  . Smoking status: Some Days    Packs/day: 0.50    Years: 35.00    Total pack years: 17.50    Types: Cigarettes    Last attempt to quit: 10/04/2010    Years since quitting: 11.4  . Smokeless tobacco: Never  . Tobacco comments:    smoking on and off-- few cig here and there  Substance and Sexual Activity  . Alcohol use: Yes    Alcohol/week: 1.0 standard drink of alcohol    Types: 1 Glasses of wine  per week    Comment: rare-- actually < 1x   . Drug use: No  . Sexual activity: Yes    Partners: Male  Other Topics Concern  . Not on file  Social History Narrative   Exercise-  walking some    Social Determinants of Health   Financial Resource Strain: Low Risk  (07/18/2021)   Overall Financial Resource Strain (CARDIA)   . Difficulty of Paying Living Expenses: Not hard at all  Food Insecurity: No Food Insecurity (07/18/2021)   Hunger Vital Sign   . Worried About Charity fundraiser in the Last Year: Never true   . Ran Out of Food in the Last Year: Never true  Transportation Needs: No Transportation Needs (07/18/2021)   PRAPARE - Transportation   . Lack of Transportation (Medical): No   . Lack of Transportation (Non-Medical): No  Physical Activity: Insufficiently Active (07/18/2021)   Exercise Vital Sign   . Days of Exercise per Week: 4 days   . Minutes of Exercise per Session: 30 min  Stress: No Stress Concern Present (07/18/2021)   [employer] - Occupational Stress Questionnaire   . Feeling of Stress : Not at all  Social Connections: Socially Integrated (07/18/2021)   Social Connection and Isolation Panel [NHANES]   . Frequency of Communication with Friends and Family: More than three times a week   . Frequency of Social Gatherings with Friends and Family: Three times a week   . Attends Religious Services: More than 4 times per year   . Active Member of Clubs or Organizations: Yes   . Attends Archivist Meetings: More than 4 times per year   . Marital Status: Living with partner  Intimate Partner Violence: Not At Risk (07/18/2021)   Humiliation, Afraid, Rape, and Kick questionnaire   . Fear of Current or Ex-Partner: No   . Emotionally Abused: No   . Physically Abused: No   . Sexually Abused: No    Outpatient Medications Prior to Visit  Medication Sig Dispense Refill  . acetaminophen (TYLENOL) 325 MG tablet     . amoxicillin (AMOXIL) 500  MG capsule 4 capsules (2gm) po 1 hour prior to dental work 12 capsule 1  . amoxicillin-clavulanate (AUGMENTIN) 875-125 MG tablet Take 1 tablet by mouth 2 (two) times daily. 20 tablet 0  . benzonatate (TESSALON PERLES) 100 MG capsule Take 2 capsules (200 mg total) by mouth 3 (three) times daily as needed for cough. 30 capsule 0  . fluticasone (FLONASE) 50 MCG/ACT nasal spray Place 2 sprays into both nostrils daily. 16 g 6  . lisinopril (ZESTRIL) 20 MG tablet Take 1 tablet (20 mg total) by mouth daily. 30 tablet 0  . Multiple Vitamin (MULTIVITAMIN) capsule Take 1 capsule by mouth daily.    . predniSONE (DELTASONE) 10 MG tablet TAKE 3 TABLETS PO QD FOR 3 DAYS THEN TAKE 2 TABLETS PO QD FOR 3 DAYS THEN TAKE 1 TABLET PO QD FOR 3 DAYS THEN TAKE 1/2 TAB PO QD FOR 3 DAYS 20 tablet 0   No facility-administered medications prior to visit.    Allergies  Allergen Reactions  . Azithromycin Nausea Only  . Oxycodone Nausea Only  . Naproxen Rash    Review of Systems  Constitutional:  Negative for fever and malaise/fatigue.  HENT:  Negative for congestion.   Eyes:  Negative for blurred vision.  Respiratory:  Positive for cough (Mild dry cough). Negative for shortness of breath.        (+)Lower airway congestion  Cardiovascular:  Negative for chest pain, palpitations and leg swelling.  Gastrointestinal:  Negative for abdominal pain, blood in stool and nausea.  Genitourinary:  Negative for dysuria and frequency.  Musculoskeletal:  Negative for falls.  Skin:  Negative for rash.  Neurological:  Negative for dizziness, loss of consciousness and headaches.  Endo/Heme/Allergies:  Negative for environmental allergies.  Psychiatric/Behavioral:  Negative for depression. The patient is not nervous/anxious.        Objective:    Physical Exam Vitals and nursing note reviewed.  Constitutional:      General: She is not in acute distress.    Appearance: Normal appearance. She is well-developed. She is not  ill-appearing.  HENT:     Head: Normocephalic and atraumatic.     Right Ear: External ear normal.     Left Ear: External ear normal.  Eyes:     Extraocular Movements: Extraocular movements intact.     Conjunctiva/sclera: Conjunctivae normal.     Pupils: Pupils are equal, round, and reactive to light.  Neck:     Thyroid: No thyromegaly.  Vascular: No carotid bruit or JVD.  Cardiovascular:     Rate and Rhythm: Normal rate and regular rhythm.     Heart sounds: Normal heart sounds. No murmur heard.    No gallop.  Pulmonary:     Effort: Pulmonary effort is normal. No respiratory distress.     Breath sounds: Normal breath sounds. No wheezing or rales.     Comments: (+)Lower airway congestion Chest:     Chest wall: No tenderness.  Musculoskeletal:     Cervical back: Normal range of motion and neck supple.  Skin:    General: Skin is warm and dry.  Neurological:     Mental Status: She is alert and oriented to person, place, and time.  Psychiatric:        Judgment: Judgment normal.    There were no vitals taken for this visit. Wt Readings from Last 3 Encounters:  01/16/22 161 lb (73 kg)  05/22/21 163 lb (73.9 kg)  05/17/20 170 lb 12.8 oz (77.5 kg)       Assessment & Plan:  There are no diagnoses linked to this encounter.   I,Alexander Ruley,acting as a Education administrator for [employer], DO.,have documented all relevant documentation on the behalf of [name], DO,as directed by  [name], DO while in the presence of [name], DO.   I, Daiva Huge, personally preformed the services described in this documentation.  All medical record entries made by the scribe were at my direction and in my presence.  I have reviewed the chart and discharge instructions (if applicable) and agree that the record reflects my personal performance and is accurate and complete. 03/20/22    Daiva Huge

## 2022-03-20 NOTE — Assessment & Plan Note (Signed)
Increase lisinopril to 40 mg daily #30   2 refills  F/u in march

## 2022-03-21 DIAGNOSIS — Z13820 Encounter for screening for osteoporosis: Secondary | ICD-10-CM | POA: Diagnosis not present

## 2022-03-21 DIAGNOSIS — Z780 Asymptomatic menopausal state: Secondary | ICD-10-CM | POA: Diagnosis not present

## 2022-03-21 LAB — HM DEXA SCAN: HM Dexa Scan: NORMAL

## 2022-03-31 ENCOUNTER — Encounter: Payer: Self-pay | Admitting: Family Medicine

## 2022-04-02 ENCOUNTER — Other Ambulatory Visit: Payer: Self-pay | Admitting: Family Medicine

## 2022-04-02 DIAGNOSIS — I10 Essential (primary) hypertension: Secondary | ICD-10-CM

## 2022-04-02 MED ORDER — LOSARTAN POTASSIUM 50 MG PO TABS: 50.0000 mg | ORAL_TABLET | Freq: Every day | ORAL | 1 refills | Status: DC

## 2022-04-03 DIAGNOSIS — M5440 Lumbago with sciatica, unspecified side: Secondary | ICD-10-CM | POA: Diagnosis not present

## 2022-04-03 DIAGNOSIS — M5416 Radiculopathy, lumbar region: Secondary | ICD-10-CM | POA: Diagnosis not present

## 2022-04-04 ENCOUNTER — Other Ambulatory Visit: Payer: Self-pay

## 2022-04-04 ENCOUNTER — Encounter: Payer: Self-pay | Admitting: Family Medicine

## 2022-04-04 MED ORDER — AMLODIPINE BESYLATE 5 MG PO TABS: 5.0000 mg | ORAL_TABLET | Freq: Every day | ORAL | 2 refills | Status: DC

## 2022-04-16 ENCOUNTER — Encounter: Payer: Self-pay | Admitting: Family Medicine

## 2022-04-16 ENCOUNTER — Ambulatory Visit (INDEPENDENT_AMBULATORY_CARE_PROVIDER_SITE_OTHER): Payer: Medicare Other | Admitting: Family Medicine

## 2022-04-16 VITALS — BP 142/88 | HR 74 | Temp 98.2°F | Resp 18 | Ht 66.0 in | Wt 161.0 lb

## 2022-04-16 DIAGNOSIS — Z23 Encounter for immunization: Secondary | ICD-10-CM | POA: Diagnosis not present

## 2022-04-16 DIAGNOSIS — I10 Essential (primary) hypertension: Secondary | ICD-10-CM | POA: Diagnosis not present

## 2022-04-16 MED ORDER — AMLODIPINE BESYLATE 5 MG PO TABS: 5.0000 mg | ORAL_TABLET | Freq: Every day | ORAL | 1 refills | Status: DC

## 2022-04-16 MED ORDER — HYDROCHLOROTHIAZIDE 25 MG PO TABS: 25.0000 mg | ORAL_TABLET | Freq: Every day | ORAL | 3 refills | Status: DC

## 2022-04-16 NOTE — Assessment & Plan Note (Signed)
Poorly controlled will alter medications, encouraged DASH diet, minimize caffeine and obtain adequate sleep. Report concerning symptoms and follow up as directed and as needed  Add hctz daily to the [employer]

## 2022-04-16 NOTE — Patient Instructions (Signed)

## 2022-04-16 NOTE — Progress Notes (Signed)
Subjective:   By signing my name below, I, Ashley Salinas, attest that this documentation has been prepared under the direction and in the presence of [name], DO 04/16/22   Patient ID: Ashley Salinas, female    DOB: Jul 11, 1955, 67 y.o.   MRN: [id number]  Chief Complaint  Patient presents with   Hypertension    Pt states blood pressures are still high at home like 160s/90s   Follow-up    HPI Patient is in today for a follow up.  She is compliant with Amlodipine, 5 mg. She stopped Losartan due to intolerance.  She has still been following up with Dr. Saintclair Halsted  She continues to work on quitting smoking. She reported she has an upcoming pap smear scheduled.   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,  01/2011   Left- Dr.Moore--- revision   TOTAL HIP ARTHROPLASTY     Left- Dr.Moore   TUBAL LIGATION  2004    Family History  Problem Relation Age of Onset   Breast cancer Mother    Cancer Mother 59       breast   Arthritis Father    Cancer Father        multiple myeloma  stage 3B    Social History   Socioeconomic History   Marital status: Widowed    Spouse name: Not on file   Number of children: Not on file   Years of education: Not on file   Highest education level: Not on file  Occupational History    Comment: unemployed-- looking for another job  Tobacco Use   Smoking status: Some Days    Packs/day: 0.50    Years: 35.00    Total pack years: 17.50    Types: Cigarettes    Last attempt to quit: 10/04/2010    Years since quitting: 11.5   Smokeless tobacco: Never   Tobacco comments:    smoking on and off-- few cig here and there  Substance and Sexual Activity   Alcohol use: Yes    Alcohol/week: 1.0 standard drink of alcohol    Types: 1 Glasses  of wine per week    Comment: rare-- actually < 1x    Drug use: No   Sexual activity: Yes    Partners: Male  Other Topics Concern   Not on file  Social History Narrative   Exercise-  walking some    Social Determinants of Health   Financial Resource Strain: Low Risk  (07/18/2021)   Overall Financial Resource Strain (CARDIA)    Difficulty of Paying Living Expenses: Not hard at all  Food Insecurity: No Food Insecurity (07/18/2021)   Hunger Vital Sign    Worried About Running Out of Food in the Last Year: Never true    [place] in the Last Year: Never true  Transportation Needs: No Transportation Needs (07/18/2021)   PRAPARE - Hydrologist (Medical): No    Lack of Transportation (Non-Medical): No  Physical Activity: Insufficiently Active (07/18/2021)   Exercise Vital Sign    Days of Exercise per Week: 4 days    Minutes of Exercise per Session: 30 min  Stress: No Stress Concern Present (07/18/2021)  [place]    Feeling of Stress : Not at all  Social Connections: Socially Integrated (07/18/2021)   Social Connection and Isolation Panel [NHANES]    Frequency of Communication with Friends and Family: More than three times a week    Frequency of Social Gatherings with Friends and Family: Three times a week    Attends Religious Services: More than 4 times per year    Active Member of Clubs or Organizations: Yes    Attends Archivist Meetings: More than 4 times per year    Marital Status: Living with partner  Intimate Partner Violence: Not At Risk (07/18/2021)   Humiliation, Afraid, Rape, and Kick questionnaire    Fear of Current or Ex-Partner: No    Emotionally Abused: No    Physically Abused: No    Sexually Abused: No    Outpatient Medications Prior to Visit  Medication Sig Dispense Refill   acetaminophen (TYLENOL) 325 MG tablet      amoxicillin (AMOXIL) 500 MG capsule 4  capsules (2gm) po 1 hour prior to dental work 12 capsule 1   Multiple Vitamin (MULTIVITAMIN) capsule Take 1 capsule by mouth daily.     amLODipine (NORVASC) 5 MG tablet Take 1 tablet (5 mg total) by mouth daily. 30 tablet 2   No facility-administered medications prior to visit.    Allergies  Allergen Reactions   Azithromycin Nausea Only   Oxycodone Nausea Only   Naproxen Rash    Review of Systems  Constitutional:  Negative for fever and malaise/fatigue.  HENT:  Negative for congestion.   Eyes:  Negative for blurred vision.  Respiratory:  Negative for shortness of breath.   Cardiovascular:  Negative for chest pain, palpitations and leg swelling.  Gastrointestinal:  Negative for abdominal pain, blood in stool and nausea.  Genitourinary:  Negative for dysuria and frequency.  Musculoskeletal:  Negative for falls.  Skin:  Negative for rash.  Neurological:  Negative for dizziness, loss of consciousness and headaches.  Endo/Heme/Allergies:  Negative for environmental allergies.  Psychiatric/Behavioral:  Negative for depression. The patient is not nervous/anxious.        Objective:    Physical Exam Vitals and nursing note reviewed.  Constitutional:      Appearance: She is well-developed.  HENT:     Head: Normocephalic and atraumatic.  Eyes:     Conjunctiva/sclera: Conjunctivae normal.  Neck:     Thyroid: No thyromegaly.     Vascular: No carotid bruit or JVD.  Cardiovascular:     Rate and Rhythm: Normal rate and regular rhythm.     Heart sounds: Normal heart sounds. No murmur heard. Pulmonary:     Effort: Pulmonary effort is normal. No respiratory distress.     Breath sounds: Normal breath sounds. No wheezing or rales.  Chest:     Chest wall: No tenderness.  Musculoskeletal:     Cervical back: Normal range of motion and neck supple.  Neurological:     General: No focal deficit present.     Mental Status: She is alert and oriented to person, place, and time.   Psychiatric:        Mood and Affect: Mood normal.        Behavior: Behavior normal.        Thought Content: Thought content normal.    BP (!) 142/88 (BP Location: Left Arm, Patient Position: Sitting, Cuff Size: Normal)   Pulse 74   Temp 98.2 F (36.8  C) (Oral)   Resp 18   Ht 5' 6"$  (1.676 m)   Wt 161 lb (73 kg)   SpO2 99%   BMI 25.99 kg/m  Wt Readings from Last 3 Encounters:  04/16/22 161 lb (73 kg)  01/16/22 161 lb (73 kg)  05/22/21 163 lb (73.9 kg)       Assessment & Plan:  Primary hypertension Assessment & Plan: Poorly controlled will alter medications, encouraged DASH diet, minimize caffeine and obtain adequate sleep. Report concerning symptoms and follow up as directed and as needed  Add hctz daily to the amlodapine    Orders: -     hydroCHLOROthiazide; Take 1 tablet (25 mg total) by mouth daily.  Dispense: 90 tablet; Refill: 3 -     amLODIPine Besylate; Take 1 tablet (5 mg total) by mouth daily.  Dispense: 90 tablet; Refill: 1  Need for pneumococcal 20-valent conjugate vaccination -     Pneumococcal conjugate vaccine 20-valent     I,Rachel Rivera,acting as a scribe for [name], DO.,have documented all relevant documentation on the behalf of [name], DO,as directed by  [name], DO while in the presence of [place], DO, personally preformed the services described in this documentation.  All medical record entries made by the scribe were at my direction and in my presence.  I have reviewed the chart and discharge instructions (if applicable) and agree that the record reflects my personal performance and is accurate and complete. 04/16/22   [name], DO

## 2022-04-24 DIAGNOSIS — H43811 Vitreous degeneration, right eye: Secondary | ICD-10-CM | POA: Diagnosis not present

## 2022-04-24 DIAGNOSIS — H04123 Dry eye syndrome of bilateral lacrimal glands: Secondary | ICD-10-CM | POA: Diagnosis not present

## 2022-04-24 DIAGNOSIS — H35413 Lattice degeneration of retina, bilateral: Secondary | ICD-10-CM | POA: Diagnosis not present

## 2022-04-24 DIAGNOSIS — H5203 Hypermetropia, bilateral: Secondary | ICD-10-CM | POA: Diagnosis not present

## 2022-04-24 DIAGNOSIS — H25813 Combined forms of age-related cataract, bilateral: Secondary | ICD-10-CM | POA: Diagnosis not present

## 2022-04-24 DIAGNOSIS — H524 Presbyopia: Secondary | ICD-10-CM | POA: Diagnosis not present

## 2022-05-29 ENCOUNTER — Other Ambulatory Visit (HOSPITAL_COMMUNITY)
Admission: RE | Admit: 2022-05-29 | Discharge: 2022-05-29 | Disposition: A | Discharge status: Home / Self Care | Payer: Medicare Other | Source: Ambulatory Visit | Attending: Family Medicine | Admitting: Family Medicine

## 2022-05-29 ENCOUNTER — Ambulatory Visit (INDEPENDENT_AMBULATORY_CARE_PROVIDER_SITE_OTHER): Payer: Medicare Other | Admitting: Family Medicine

## 2022-05-29 ENCOUNTER — Encounter: Payer: Self-pay | Admitting: Family Medicine

## 2022-05-29 VITALS — BP 124/80 | HR 86 | Temp 98.2°F | Resp 18 | Ht 66.0 in | Wt 159.0 lb

## 2022-05-29 DIAGNOSIS — Z0000 Encounter for general adult medical examination without abnormal findings: Secondary | ICD-10-CM | POA: Insufficient documentation

## 2022-05-29 DIAGNOSIS — I10 Essential (primary) hypertension: Secondary | ICD-10-CM

## 2022-05-29 DIAGNOSIS — Z124 Encounter for screening for malignant neoplasm of cervix: Secondary | ICD-10-CM | POA: Diagnosis not present

## 2022-05-29 DIAGNOSIS — Z96643 Presence of artificial hip joint, bilateral: Secondary | ICD-10-CM

## 2022-05-29 MED ORDER — AMOXICILLIN 500 MG PO CAPS: ORAL_CAPSULE | ORAL | 1 refills | Status: DC

## 2022-05-29 NOTE — Progress Notes (Signed)
Subjective:   By signing my name below, I, Ashley Salinas, attest that this documentation has been prepared under the direction and in the presence of Ashley Held, DO. 05/29/2022   Patient ID: Ashley Salinas, female    DOB: 1955/12/10, 67 y.o.   MRN: [id number]  Chief Complaint  Patient presents with   Annual Exam    Pt states fasting     HPI Patient is in today for a comprehensive physical exam.   She denies fever, new moles, congestion, sinus pain, sore throat, chest pain, palpitations, cough, shortness of breath, wheezing, nausea, vomiting, abdominal pain, diarrhea, constipation, dysuria, frequency, hematuria, new muscle pain, new joint pain, or headaches at this time.  She is exercising regularly at this time.  Bone density was last completed 03/21/2022. Results are normal.  Mammogram was last completed 03/08/2022. She reports being UTD on her colonoscopy.    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,  01/2011   Left- Dr.Moore--- revision   TOTAL HIP ARTHROPLASTY     Left- Dr.Moore   TUBAL LIGATION  2004    Family History  Problem Relation Age of Onset   Breast cancer Mother    Cancer Mother 67       breast   Arthritis Father    Cancer Father        multiple myeloma  stage 3B    Social History   Socioeconomic History   Marital status: Widowed    Spouse name: Not on file   Number of children: Not on file   Years of education: Not on file   Highest education level: Not on file  Occupational History    Comment: unemployed-- looking for another job  Tobacco Use   Smoking status: Some Days    Packs/day: 0.50    Years: 35.00    Additional pack years: 0.00    Total pack years: 17.50    Types: Cigarettes    Last attempt to quit: 10/04/2010     Years since quitting: 11.6   Smokeless tobacco: Never   Tobacco comments:    smoking on and off-- few cig here and there  Substance and Sexual Activity   Alcohol use: Yes    Alcohol/week: 1.0 standard drink of alcohol    Types: 1 Glasses of wine per week    Comment: rare-- actually < 1x    Drug use: No   Sexual activity: Yes    Partners: Male  Other Topics Concern   Not on file  Social History Narrative   Exercise-  walking some    Social Determinants of Health   Financial Resource Strain: Low Risk  (07/18/2021)   Overall Financial Resource Strain (CARDIA)    Difficulty of Paying Living Expenses: Not hard at all  Food Insecurity: No Food Insecurity (07/18/2021)   Hunger Vital Sign    Worried About Running Out of Food in the Last Year: Never true    [place] in the Last Year: Never true  Transportation Needs: No Transportation Needs (07/18/2021)   PRAPARE - Hydrologist (Medical): No    Lack of Transportation (Non-Medical): No  Physical Activity: Insufficiently Active (07/18/2021)   Exercise  Vital Sign    Days of Exercise per Week: 4 days    Minutes of Exercise per Session: 30 min  Stress: No Stress Concern Present (07/18/2021)   [place]    Feeling of Stress : Not at all  Social Connections: [place] (07/18/2021)   Social Connection and Isolation Panel [NHANES]    Frequency of Communication with Friends and Family: More than three times a week    Frequency of Social Gatherings with Friends and Family: Three times a week    Attends Religious Services: More than 4 times per year    Active Member of Clubs or Organizations: Yes    Attends Archivist Meetings: More than 4 times per year    Marital Status: Living with partner  Intimate Partner Violence: Not At Risk (07/18/2021)   Humiliation, Afraid, Rape, and Kick questionnaire    Fear of Current or  Ex-Partner: No    Emotionally Abused: No    Physically Abused: No    Sexually Abused: No    Outpatient Medications Prior to Visit  Medication Sig Dispense Refill   acetaminophen (TYLENOL) 325 MG tablet      amLODipine (NORVASC) 5 MG tablet Take 1 tablet (5 mg total) by mouth daily. 90 tablet 1   hydrochlorothiazide (HYDRODIURIL) 25 MG tablet Take 1 tablet (25 mg total) by mouth daily. 90 tablet 3   Multiple Vitamin (MULTIVITAMIN) capsule Take 1 capsule by mouth daily.     amoxicillin (AMOXIL) 500 MG capsule 4 capsules (2gm) po 1 hour prior to dental work 12 capsule 1   No facility-administered medications prior to visit.    Allergies  Allergen Reactions   Azithromycin Nausea Only   Oxycodone Nausea Only   Naproxen Rash    Review of Systems  Constitutional:  Negative for fever and malaise/fatigue.  HENT:  Negative for congestion, sinus pain and sore throat.   Eyes:  Negative for blurred vision.  Respiratory:  Negative for cough, shortness of breath and wheezing.   Cardiovascular:  Negative for chest pain, palpitations and leg swelling.  Gastrointestinal:  Negative for abdominal pain, blood in stool, constipation, diarrhea, nausea and vomiting.  Genitourinary:  Negative for dysuria, frequency and hematuria.  Musculoskeletal:  Negative for falls.       (-)new muscle pain (-)new joint pain  Skin:  Negative for rash.       (-)New moles  Neurological:  Negative for dizziness, loss of consciousness and headaches.  Endo/Heme/Allergies:  Negative for environmental allergies.  Psychiatric/Behavioral:  Negative for depression. The patient is not nervous/anxious.        Objective:    Physical Exam Vitals and nursing note reviewed.  Constitutional:      General: She is not in acute distress.    Appearance: Normal appearance. She is well-developed. She is not ill-appearing.  HENT:     Head: Normocephalic and atraumatic.     Right Ear: Tympanic membrane, ear canal and external  ear normal.     Left Ear: Tympanic membrane, ear canal and external ear normal.     Nose: Nose normal.  Eyes:     Extraocular Movements: Extraocular movements intact.     Pupils: Pupils are equal, round, and reactive to light.  Cardiovascular:     Rate and Rhythm: Normal rate and regular rhythm.     Heart sounds: Normal heart sounds. No murmur heard.    No gallop.  Pulmonary:  Effort: Pulmonary effort is normal. No respiratory distress.     Breath sounds: Normal breath sounds. No wheezing or rales.  Chest:     Chest wall: No tenderness.  Abdominal:     General: Bowel sounds are normal. There is no distension.     Palpations: Abdomen is soft.     Tenderness: There is no abdominal tenderness. There is no guarding.  Genitourinary:    Vagina: Normal.     Cervix: Normal.  Musculoskeletal:        General: Normal range of motion.     Cervical back: Normal range of motion and neck supple.  Skin:    General: Skin is warm and dry.  Neurological:     General: No focal deficit present.     Mental Status: She is alert and oriented to person, place, and time.  Psychiatric:        Mood and Affect: Mood normal.        Behavior: Behavior normal.        Thought Content: Thought content normal.        Judgment: Judgment normal.     BP 124/80 (BP Location: Left Arm, Patient Position: Sitting, Cuff Size: Normal)   Pulse 86   Temp 98.2 F (36.8 C) (Oral)   Resp 18   Ht 5\' 6"  (1.676 m)   Wt 159 lb (72.1 kg)   SpO2 99%   BMI 25.66 kg/m  Wt Readings from Last 3 Encounters:  05/29/22 159 lb (72.1 kg)  04/16/22 161 lb (73 kg)  01/16/22 161 lb (73 kg)       Assessment & Plan:  Preventative health care -     Cytology - PAP -     CBC with Differential/Platelet -     Comprehensive metabolic panel -     Lipid panel  History of bilateral hip replacements -     Amoxicillin; 4 capsules (2gm) po 1 hour prior to dental work  Dispense: 12 capsule; Refill: 1  Primary hypertension -      CBC with Differential/Platelet -     Comprehensive metabolic panel -     Lipid panel    I, Ashley Held, DO, personally preformed the services described in this documentation.  All medical record entries made by the scribe were at my direction and in my presence.  I have reviewed the chart and discharge instructions (if applicable) and agree that the record reflects my personal performance and is accurate and complete. 05/29/2022   I,Ashley Salinas,acting as a scribe for Ashley Held, DO.,have documented all relevant documentation on the behalf of Ashley Held, DO,as directed by  Ashley Held, DO while in the presence of Ashley Held, DO.   Ashley Held, DO

## 2022-05-29 NOTE — Patient Instructions (Signed)
Preventive Care 65 Years and Older, Female Preventive care refers to lifestyle choices and visits with your health care provider that can promote health and wellness. Preventive care visits are also called wellness exams. What can I expect for my preventive care visit? Counseling Your health care provider may ask you questions about your: Medical history, including: Past medical problems. Family medical history. Pregnancy and menstrual history. History of falls. Current health, including: Memory and ability to understand (cognition). Emotional well-being. Home life and relationship well-being. Sexual activity and sexual health. Lifestyle, including: Alcohol, nicotine or tobacco, and drug use. Access to firearms. Diet, exercise, and sleep habits. Work and work environment. Sunscreen use. Safety issues such as seatbelt and bike helmet use. Physical exam Your health care provider will check your: Height and weight. These may be used to calculate your BMI (body mass index). BMI is a measurement that tells if you are at a healthy weight. Waist circumference. This measures the distance around your waistline. This measurement also tells if you are at a healthy weight and may help predict your risk of certain diseases, such as type 2 diabetes and high blood pressure. Heart rate and blood pressure. Body temperature. Skin for abnormal spots. What immunizations do I need?  Vaccines are usually given at various ages, according to a schedule. Your health care provider will recommend vaccines for you based on your age, medical history, and lifestyle or other factors, such as travel or where you work. What tests do I need? Screening Your health care provider may recommend screening tests for certain conditions. This may include: Lipid and cholesterol levels. Hepatitis C test. Hepatitis B test. HIV (human immunodeficiency virus) test. STI (sexually transmitted infection) testing, if you are at  risk. Lung cancer screening. Colorectal cancer screening. Diabetes screening. This is done by checking your blood sugar (glucose) after you have not eaten for a while (fasting). Mammogram. Talk with your health care provider about how often you should have regular mammograms. BRCA-related cancer screening. This may be done if you have a family history of breast, ovarian, tubal, or peritoneal cancers. Bone density scan. This is done to screen for osteoporosis. Talk with your health care provider about your test results, treatment options, and if necessary, the need for more tests. Follow these instructions at home: Eating and drinking  Eat a diet that includes fresh fruits and vegetables, whole grains, lean protein, and low-fat dairy products. Limit your intake of foods with high amounts of sugar, saturated fats, and salt. Take vitamin and mineral supplements as recommended by your health care provider. Do not drink alcohol if your health care provider tells you not to drink. If you drink alcohol: Limit how much you have to 0-1 drink a day. Know how much alcohol is in your drink. In the [place] one drink equals one 12 oz bottle of beer (355 mL), one 5 oz glass of wine (148 mL), or one 1 oz glass of hard liquor (44 mL). Lifestyle Brush your teeth every morning and night with fluoride toothpaste. Floss one time each day. Exercise for at least 30 minutes 5 or more days each week. Do not use any products that contain nicotine or tobacco. These products include cigarettes, chewing tobacco, and vaping devices, such as e-cigarettes. If you need help quitting, ask your health care provider. Do not use drugs. If you are sexually active, practice safe sex. Use a condom or other form of protection in order to prevent STIs. Take aspirin only as told by   your health care provider. Make sure that you understand how much to take and what form to take. Work with your health care provider to find out whether it  is safe and beneficial for you to take aspirin daily. Ask your health care provider if you need to take a cholesterol-lowering medicine (statin). Find healthy ways to manage stress, such as: Meditation, yoga, or listening to music. Journaling. Talking to a trusted person. Spending time with friends and family. Minimize exposure to UV radiation to reduce your risk of skin cancer. Safety Always wear your seat belt while driving or riding in a vehicle. Do not drive: If you have been drinking alcohol. Do not ride with someone who has been drinking. When you are tired or distracted. While texting. If you have been using any mind-altering substances or drugs. Wear a helmet and other protective equipment during sports activities. If you have firearms in your house, make sure you follow all gun safety procedures. What's next? Visit your health care provider once a year for an annual wellness visit. Ask your health care provider how often you should have your eyes and teeth checked. Stay up to date on all vaccines. This information is not intended to replace advice given to you by your health care provider. Make sure you discuss any questions you have with your health care provider. Document Revised: 08/17/2020 Document Reviewed: 08/17/2020 Elsevier Patient Education  2023 Elsevier Inc.   

## 2022-05-30 ENCOUNTER — Other Ambulatory Visit: Payer: Self-pay

## 2022-05-30 ENCOUNTER — Encounter: Payer: Self-pay | Admitting: Family Medicine

## 2022-05-30 ENCOUNTER — Other Ambulatory Visit: Payer: Self-pay | Admitting: Family Medicine

## 2022-05-30 DIAGNOSIS — E785 Hyperlipidemia, unspecified: Secondary | ICD-10-CM

## 2022-05-30 LAB — LIPID PANEL
Cholesterol: 250 mg/dL — ABNORMAL HIGH (ref 0–200)
HDL: 88.4 mg/dL (ref >39.00)
LDL Cholesterol: 122 mg/dL — ABNORMAL HIGH (ref 0–99)
NonHDL: 161.23
Total CHOL/HDL Ratio: 3
Triglycerides: 195 mg/dL — ABNORMAL HIGH (ref 0.0–149.0)
VLDL: 39 mg/dL (ref 0.0–40.0)

## 2022-05-30 LAB — COMPREHENSIVE METABOLIC PANEL
ALT: 14 U/L (ref 0–35)
AST: 24 U/L (ref 0–37)
Albumin: 4.6 g/dL (ref 3.5–5.2)
Alkaline Phosphatase: 57 U/L (ref 39–117)
BUN: 18 mg/dL (ref 6–23)
CO2: 27 mEq/L (ref 19–32)
Calcium: 9.8 mg/dL (ref 8.4–10.5)
Chloride: 99 mEq/L (ref 96–112)
Creatinine, Ser: 0.92 mg/dL (ref 0.40–1.20)
GFR: 65.36 mL/min (ref >60.00)
Glucose, Bld: 81 mg/dL (ref 70–99)
Potassium: 3.3 mEq/L — ABNORMAL LOW (ref 3.5–5.1)
Sodium: 138 mEq/L (ref 135–145)
Total Bilirubin: 0.5 mg/dL (ref 0.2–1.2)
Total Protein: 7.5 g/dL (ref 6.0–8.3)

## 2022-05-30 LAB — CBC WITH DIFFERENTIAL/PLATELET
Basophils Relative: 2.6 % (ref 0.0–3.0)
Eosinophils Relative: 2.2 % (ref 0.0–5.0)
HCT: 42.1 % (ref 36.0–46.0)
Hemoglobin: 14.4 g/dL (ref 12.0–15.0)
Lymphocytes Relative: 35.1 % (ref 12.0–46.0)
MCHC: 34.3 g/dL (ref 30.0–36.0)
MCV: 99.1 fl (ref 78.0–100.0)
Monocytes Relative: 0.6 % — ABNORMAL LOW (ref 3.0–12.0)
Neutrophils Relative %: 59.5 % (ref 43.0–77.0)
Platelets: 274 10*3/uL (ref 150.0–400.0)
RBC: 4.25 Mil/uL (ref 3.87–5.11)
RDW: 13.4 % (ref 11.5–15.5)
WBC: 6.3 10*3/uL (ref 4.0–10.5)

## 2022-05-30 MED ORDER — ROSUVASTATIN CALCIUM 10 MG PO TABS: 10.0000 mg | ORAL_TABLET | Freq: Every day | ORAL | 2 refills | Status: DC

## 2022-05-31 LAB — CYTOLOGY - PAP: Diagnosis: NEGATIVE

## 2022-06-03 ENCOUNTER — Encounter: Payer: Self-pay | Admitting: Family Medicine

## 2022-06-04 MED ORDER — FLUCONAZOLE 150 MG PO TABS: 150.0000 mg | ORAL_TABLET | Freq: Every day | ORAL | 0 refills | Status: DC

## 2022-06-18 ENCOUNTER — Encounter: Payer: Self-pay | Admitting: *Deleted

## 2022-06-27 ENCOUNTER — Encounter: Payer: Self-pay | Admitting: Family Medicine

## 2022-06-27 DIAGNOSIS — I10 Essential (primary) hypertension: Secondary | ICD-10-CM

## 2022-06-27 MED ORDER — AMLODIPINE BESYLATE 5 MG PO TABS: 5.0000 mg | ORAL_TABLET | Freq: Every day | ORAL | 1 refills | Status: DC

## 2022-06-27 MED ORDER — ROSUVASTATIN CALCIUM 10 MG PO TABS: 10.0000 mg | ORAL_TABLET | Freq: Every day | ORAL | 1 refills | Status: DC

## 2022-07-03 DIAGNOSIS — M5416 Radiculopathy, lumbar region: Secondary | ICD-10-CM | POA: Diagnosis not present

## 2022-07-03 DIAGNOSIS — M5440 Lumbago with sciatica, unspecified side: Secondary | ICD-10-CM | POA: Diagnosis not present

## 2022-07-04 ENCOUNTER — Telehealth: Payer: Self-pay | Admitting: Family Medicine

## 2022-07-04 NOTE — Telephone Encounter (Signed)
Contacted Ashley Salinas to schedule their annual wellness visit. Call back at later date: [facility] PER PATIENT   Verlee Rossetti; Care Guide Ambulatory Clinical Support [facility] Direct Dial: [phone]

## 2022-07-19 DIAGNOSIS — M5416 Radiculopathy, lumbar region: Secondary | ICD-10-CM | POA: Diagnosis not present

## 2022-07-30 ENCOUNTER — Encounter: Payer: Self-pay | Admitting: Pharmacist

## 2022-08-14 ENCOUNTER — Other Ambulatory Visit (HOSPITAL_BASED_OUTPATIENT_CLINIC_OR_DEPARTMENT_OTHER): Payer: Self-pay | Admitting: Neurosurgery

## 2022-08-14 ENCOUNTER — Telehealth: Payer: Self-pay | Admitting: Family Medicine

## 2022-08-14 DIAGNOSIS — M5440 Lumbago with sciatica, unspecified side: Secondary | ICD-10-CM

## 2022-08-14 DIAGNOSIS — M5416 Radiculopathy, lumbar region: Secondary | ICD-10-CM | POA: Diagnosis not present

## 2022-08-14 NOTE — Telephone Encounter (Signed)
Copied from CRM [id number]. Topic: Medicare AWV >> Aug 14, 2022 11:02 AM Payton Doughty wrote: Reason for CRM: N/A 08/13/2022 FOR AWV - NO VOICEMAIL   Verlee Rossetti; Care Guide Ambulatory Clinical Support [facility] Direct Dial: [phone]

## 2022-08-20 ENCOUNTER — Encounter: Payer: Self-pay | Admitting: Family Medicine

## 2022-08-26 ENCOUNTER — Encounter: Payer: Self-pay | Admitting: Family Medicine

## 2022-08-29 ENCOUNTER — Other Ambulatory Visit (INDEPENDENT_AMBULATORY_CARE_PROVIDER_SITE_OTHER): Payer: Medicare Other

## 2022-08-29 ENCOUNTER — Ambulatory Visit (HOSPITAL_BASED_OUTPATIENT_CLINIC_OR_DEPARTMENT_OTHER)
Admission: RE | Admit: 2022-08-29 | Discharge: 2022-08-29 | Disposition: A | Discharge status: Home / Self Care | Payer: Medicare Other | Source: Ambulatory Visit | Attending: Neurosurgery | Admitting: Neurosurgery

## 2022-08-29 DIAGNOSIS — M5440 Lumbago with sciatica, unspecified side: Secondary | ICD-10-CM | POA: Insufficient documentation

## 2022-08-29 DIAGNOSIS — M4316 Spondylolisthesis, lumbar region: Secondary | ICD-10-CM | POA: Diagnosis not present

## 2022-08-29 DIAGNOSIS — M5116 Intervertebral disc disorders with radiculopathy, lumbar region: Secondary | ICD-10-CM | POA: Diagnosis not present

## 2022-08-29 DIAGNOSIS — E785 Hyperlipidemia, unspecified: Secondary | ICD-10-CM | POA: Diagnosis not present

## 2022-08-29 DIAGNOSIS — M4726 Other spondylosis with radiculopathy, lumbar region: Secondary | ICD-10-CM | POA: Diagnosis not present

## 2022-08-29 DIAGNOSIS — M48061 Spinal stenosis, lumbar region without neurogenic claudication: Secondary | ICD-10-CM | POA: Diagnosis not present

## 2022-08-30 ENCOUNTER — Other Ambulatory Visit: Payer: Medicare Other

## 2022-08-30 LAB — LIPID PANEL
Cholesterol: 142 mg/dL (ref 0–200)
HDL: 63.4 mg/dL (ref >39.00)
NonHDL: 78.98
Total CHOL/HDL Ratio: 2
Triglycerides: 211 mg/dL — ABNORMAL HIGH (ref 0.0–149.0)
VLDL: 42.2 mg/dL — ABNORMAL HIGH (ref 0.0–40.0)

## 2022-08-30 LAB — COMPREHENSIVE METABOLIC PANEL
ALT: 19 U/L (ref 0–35)
AST: 27 U/L (ref 0–37)
Albumin: 4.2 g/dL (ref 3.5–5.2)
Alkaline Phosphatase: 49 U/L (ref 39–117)
BUN: 20 mg/dL (ref 6–23)
CO2: 27 mEq/L (ref 19–32)
Calcium: 9.6 mg/dL (ref 8.4–10.5)
Chloride: 109 mEq/L (ref 96–112)
Creatinine, Ser: 1.04 mg/dL (ref 0.40–1.20)
GFR: 56.32 mL/min — ABNORMAL LOW (ref >60.00)
Glucose, Bld: 79 mg/dL (ref 70–99)
Potassium: 4.2 mEq/L (ref 3.5–5.1)
Sodium: 145 mEq/L (ref 135–145)
Total Bilirubin: 0.4 mg/dL (ref 0.2–1.2)
Total Protein: 6.7 g/dL (ref 6.0–8.3)

## 2022-08-30 LAB — LDL CHOLESTEROL, DIRECT: Direct LDL: 54 mg/dL

## 2022-09-18 DIAGNOSIS — M5416 Radiculopathy, lumbar region: Secondary | ICD-10-CM | POA: Diagnosis not present

## 2022-09-18 DIAGNOSIS — M5440 Lumbago with sciatica, unspecified side: Secondary | ICD-10-CM | POA: Diagnosis not present

## 2022-10-01 ENCOUNTER — Telehealth: Payer: Self-pay | Admitting: Family Medicine

## 2022-10-01 NOTE — Telephone Encounter (Signed)
Copied from CRM [id number]. Topic: Medicare AWV >> Oct 01, 2022 11:23 AM Payton Doughty wrote: Reason for CRM: LM 10/01/2022 to schedule AWV   Verlee Rossetti; Care Guide Ambulatory Clinical Support  l [facility] Direct Dial: [phone]

## 2022-11-27 DIAGNOSIS — M5416 Radiculopathy, lumbar region: Secondary | ICD-10-CM | POA: Diagnosis not present

## 2022-12-05 ENCOUNTER — Other Ambulatory Visit: Payer: Self-pay | Admitting: Family Medicine

## 2022-12-05 ENCOUNTER — Encounter: Payer: Self-pay | Admitting: Family Medicine

## 2022-12-05 DIAGNOSIS — I10 Essential (primary) hypertension: Secondary | ICD-10-CM

## 2022-12-05 DIAGNOSIS — Z96643 Presence of artificial hip joint, bilateral: Secondary | ICD-10-CM

## 2022-12-05 MED ORDER — AMOXICILLIN 500 MG PO CAPS: ORAL_CAPSULE | ORAL | 1 refills | Status: DC

## 2022-12-05 MED ORDER — AMLODIPINE BESYLATE 5 MG PO TABS
5.0000 mg | ORAL_TABLET | Freq: Every day | ORAL | 1 refills | Status: DC
Start: 2022-12-05 — End: 2023-05-30

## 2022-12-05 NOTE — Telephone Encounter (Signed)
Okay to refill the antibiotic??

## 2022-12-11 ENCOUNTER — Encounter: Payer: Self-pay | Admitting: Family Medicine

## 2023-01-01 DIAGNOSIS — M48061 Spinal stenosis, lumbar region without neurogenic claudication: Secondary | ICD-10-CM | POA: Diagnosis not present

## 2023-01-01 DIAGNOSIS — M5416 Radiculopathy, lumbar region: Secondary | ICD-10-CM | POA: Diagnosis not present

## 2023-02-19 ENCOUNTER — Other Ambulatory Visit: Payer: Self-pay | Admitting: Family Medicine

## 2023-02-22 ENCOUNTER — Other Ambulatory Visit: Payer: Self-pay | Admitting: Family Medicine

## 2023-02-22 ENCOUNTER — Encounter: Payer: Self-pay | Admitting: Family Medicine

## 2023-02-22 DIAGNOSIS — Z96643 Presence of artificial hip joint, bilateral: Secondary | ICD-10-CM

## 2023-02-22 MED ORDER — AMOXICILLIN 500 MG PO CAPS: ORAL_CAPSULE | ORAL | 1 refills | Status: DC

## 2023-03-11 DIAGNOSIS — Z1231 Encounter for screening mammogram for malignant neoplasm of breast: Secondary | ICD-10-CM | POA: Diagnosis not present

## 2023-03-11 LAB — HM MAMMOGRAPHY

## 2023-03-12 DIAGNOSIS — M5416 Radiculopathy, lumbar region: Secondary | ICD-10-CM | POA: Diagnosis not present

## 2023-03-13 ENCOUNTER — Encounter: Payer: Self-pay | Admitting: Family Medicine

## 2023-04-05 ENCOUNTER — Other Ambulatory Visit: Payer: Self-pay | Admitting: Family Medicine

## 2023-04-05 DIAGNOSIS — I10 Essential (primary) hypertension: Secondary | ICD-10-CM

## 2023-04-20 ENCOUNTER — Encounter: Payer: Self-pay | Admitting: Family Medicine

## 2023-04-29 ENCOUNTER — Other Ambulatory Visit: Payer: Self-pay | Admitting: Family Medicine

## 2023-04-29 ENCOUNTER — Encounter: Payer: Self-pay | Admitting: Family Medicine

## 2023-04-29 DIAGNOSIS — I10 Essential (primary) hypertension: Secondary | ICD-10-CM

## 2023-04-30 ENCOUNTER — Telehealth (INDEPENDENT_AMBULATORY_CARE_PROVIDER_SITE_OTHER): Payer: Medicare Other | Admitting: Family Medicine

## 2023-04-30 ENCOUNTER — Encounter: Payer: Self-pay | Admitting: Family Medicine

## 2023-04-30 DIAGNOSIS — R051 Acute cough: Secondary | ICD-10-CM

## 2023-04-30 DIAGNOSIS — J0140 Acute pansinusitis, unspecified: Secondary | ICD-10-CM | POA: Diagnosis not present

## 2023-04-30 MED ORDER — FLUTICASONE PROPIONATE 50 MCG/ACT NA SUSP
2.0000 | Freq: Every day | NASAL | 6 refills | Status: DC
Start: 2023-04-30 — End: 2023-06-04

## 2023-04-30 MED ORDER — AMOXICILLIN-POT CLAVULANATE 875-125 MG PO TABS
1.0000 | ORAL_TABLET | Freq: Two times a day (BID) | ORAL | 0 refills | Status: DC
Start: 2023-04-30 — End: 2023-06-04

## 2023-04-30 MED ORDER — PROMETHAZINE-DM 6.25-15 MG/5ML PO SYRP
5.0000 mL | ORAL_SOLUTION | Freq: Four times a day (QID) | ORAL | 0 refills | Status: DC | PRN
Start: 2023-04-30 — End: 2023-06-04

## 2023-04-30 NOTE — Progress Notes (Signed)
 MyChart Video Visit    Virtual Visit via Video Note   This patient is at least at moderate risk for complications without adequate follow up. This format is felt to be most appropriate for this patient at this time. Physical exam was limited by quality of the video and audio technology used for the visit. Ashley Salinas was able to get the patient set up on a video visit.  Patient location: home Patient and provider in visit Provider location: Office  I discussed the limitations of evaluation and management by telemedicine and the availability of in person appointments. The patient expressed understanding and agreed to proceed.  Visit Date: 04/30/2023  Today's healthcare provider: Donato Schultz, DO     Subjective:    Patient ID: Ashley Salinas, female    DOB: September 11, 1955, 68 y.o.   MRN: [id number]  No chief complaint on file.   HPI Patient is in today for sinus pressure/ congestion .  Discussed the use of AI scribe software for clinical note transcription with the patient, who gave verbal consent to proceed.  History of Present Illness   Ashley Salinas is a 68 year old female who presents with persistent sinus symptoms following a cold.  She has been experiencing persistent sinus symptoms for the past two weeks following a cold. Symptoms include thick green mucus drainage from her nose and sinus pressure around her eyes. The symptoms fluctuate, improving one day and worsening the next. No fever is present, and a home COVID-19 test was negative.  Approximately four weeks ago, she had a stomach virus, which was followed by the onset of her current cold symptoms. Despite efforts to rest and stay hydrated, her symptoms have persisted.  She has been taking Tylenol for symptom relief but has not used Flonase or antihistamines. She mentions having Afrin nasal mist but is unsure if she has Flonase. Occasional coughing is present, but symptoms are primarily sinus-related.  She is not  allergic to any antibiotics except Z-Pak and does not experience yeast infections with antibiotic use.       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,  01/2011   Left- Dr.Moore--- revision   TOTAL HIP ARTHROPLASTY     Left- Dr.Moore   TUBAL LIGATION  2004    Family History  Problem Relation Age of Onset   Breast cancer Mother    Cancer Mother 61       breast   Arthritis Father    Cancer Father        multiple myeloma  stage 3B    Social History   Socioeconomic History   Marital status: Widowed    Spouse name: Not on file   Number of children: Not on file   Years of education: Not on file   Highest education level: Not on file  Occupational History    Comment: unemployed-- looking for another job  Tobacco Use   Smoking status: Some Days    Current packs/day: 0.00    Average packs/day: 0.5 packs/day for 35.0 years (17.5 ttl pk-yrs)    Types: Cigarettes    Start date: 10/04/1975    Last attempt to quit: 10/04/2010    Years since quitting: 12.5  Smokeless tobacco: Never   Tobacco comments:    smoking on and off-- few cig here and there  Substance and Sexual Activity   Alcohol use: Yes    Alcohol/week: 1.0 standard drink of alcohol    Types: 1 Glasses of wine per week    Comment: rare-- actually < 1x    Drug use: No   Sexual activity: Yes    Partners: Male  Other Topics Concern   Not on file  Social History Narrative   Exercise-  walking some    Social Drivers of Health   Financial Resource Strain: Low Risk  (07/18/2021)   Overall Financial Resource Strain (CARDIA)    Difficulty of Paying Living Expenses: Not hard at all  Food Insecurity: No Food Insecurity (07/18/2021)   Hunger Vital Sign    Worried About Running Out of Food in the Last Year:  Never true    Ran Out of Food in the Last Year: Never true  Transportation Needs: No Transportation Needs (07/18/2021)   PRAPARE - Administrator, Civil Service (Medical): No    Lack of Transportation (Non-Medical): No  Physical Activity: Insufficiently Active (07/18/2021)   Exercise Vital Sign    Days of Exercise per Week: 4 days    Minutes of Exercise per Session: 30 min  Stress: No Stress Concern Present (07/18/2021)   [employer] of Occupational Health - Occupational Stress Questionnaire    Feeling of Stress : Not at all  Social Connections: Socially Integrated (07/18/2021)   Social Connection and Isolation Panel [NHANES]    Frequency of Communication with Friends and Family: More than three times a week    Frequency of Social Gatherings with Friends and Family: Three times a week    Attends Religious Services: More than 4 times per year    Active Member of Clubs or Organizations: Yes    Attends Banker Meetings: More than 4 times per year    Marital Status: Living with partner  Intimate Partner Violence: Not At Risk (07/18/2021)   Humiliation, Afraid, Rape, and Kick questionnaire    Fear of Current or Ex-Partner: No    Emotionally Abused: No    Physically Abused: No    Sexually Abused: No    Outpatient Medications Prior to Visit  Medication Sig Dispense Refill   acetaminophen (TYLENOL) 325 MG tablet      amLODipine (NORVASC) 5 MG tablet Take 1 tablet (5 mg total) by mouth daily. 90 tablet 1   amoxicillin (AMOXIL) 500 MG capsule 4 capsules (2gm) po 1 hour prior to dental work 12 capsule 1   hydrochlorothiazide (HYDRODIURIL) 25 MG tablet TAKE 1 TABLET BY MOUTH DAILY 30 tablet 0   Multiple Vitamin (MULTIVITAMIN) capsule Take 1 capsule by mouth daily.     rosuvastatin (CRESTOR) 10 MG tablet TAKE 1 TABLET BY MOUTH DAILY 90 tablet 1   fluconazole (DIFLUCAN) 150 MG tablet Take 1 tablet (150 mg total) by mouth daily. May repeat in 3 day as needed 2 tablet 0    No facility-administered medications prior to visit.    Allergies  Allergen Reactions   Lisinopril Cough   Azithromycin Nausea Only   Oxycodone Nausea Only   Losartan Potassium Other (See Comments)    JOINT PAIN   Naproxen Rash    Review of Systems  Constitutional:  Negative for fever and malaise/fatigue.  HENT:  Positive for congestion and sinus pain. Negative for sore throat.   Eyes:  Negative for blurred  vision.  Respiratory:  Positive for cough. Negative for shortness of breath.   Cardiovascular:  Negative for chest pain, palpitations and leg swelling.  Gastrointestinal:  Negative for vomiting.  Musculoskeletal:  Negative for back pain.  Skin:  Negative for rash.  Neurological:  Negative for loss of consciousness and headaches.       Objective:    Physical Exam Vitals and nursing note reviewed.  Pulmonary:     Effort: Pulmonary effort is normal.  Psychiatric:        Mood and Affect: Mood normal.        Behavior: Behavior normal.        Thought Content: Thought content normal.        Judgment: Judgment normal.     There were no vitals taken for this visit. Wt Readings from Last 3 Encounters:  05/29/22 159 lb (72.1 kg)  04/16/22 161 lb (73 kg)  01/16/22 161 lb (73 kg)       Assessment & Plan:  Acute non-recurrent pansinusitis -     Amoxicillin-Pot Clavulanate; Take 1 tablet by mouth 2 (two) times daily.  Dispense: 20 tablet; Refill: 0 -     Fluticasone Propionate; Place 2 sprays into both nostrils daily.  Dispense: 16 g; Refill: 6  Acute cough -     Promethazine-DM; Take 5 mLs by mouth 4 (four) times daily as needed.  Dispense: 118 mL; Refill: 0   Assessment and Plan    Acute Sinusitis Presents with a two-week history of cold symptoms, now complicated by green thick mucus drainage, sinus headache, and pressure around the eyes. No fever and a negative home COVID test. Symptoms are consistent with acute sinusitis. Currently using Tylenol for symptom  relief but not using Flonase or antihistamines. Prefers prescription cough medicine for nighttime use. No history of yeast infections with antibiotics. Discussed the benefits of Flonase over Afrin due to the addiction risk associated with prolonged Afrin use. Recommend Mucinex for daytime cough management. Prescribe an antibiotic and prescription cough medicine for nighttime use. Advise using Flonase instead of Afrin. Send prescriptions to [facility] pharmacy.        I discussed the assessment and treatment plan with the patient. The patient was provided an opportunity to ask questions and all were answered. The patient agreed with the plan and demonstrated an understanding of the instructions.   The patient was advised to call back or seek an in-person evaluation if the symptoms worsen or if the condition fails to improve as anticipated.  Donato Schultz, DO [facility] [phone] (phone) [phone] (fax)  [facility]

## 2023-04-30 NOTE — Patient Instructions (Signed)

## 2023-05-06 ENCOUNTER — Encounter: Payer: Self-pay | Admitting: Family Medicine

## 2023-05-30 ENCOUNTER — Other Ambulatory Visit: Payer: Self-pay | Admitting: Family Medicine

## 2023-05-30 DIAGNOSIS — I10 Essential (primary) hypertension: Secondary | ICD-10-CM

## 2023-06-04 ENCOUNTER — Ambulatory Visit (INDEPENDENT_AMBULATORY_CARE_PROVIDER_SITE_OTHER): Payer: Medicare Other | Admitting: Family Medicine

## 2023-06-04 ENCOUNTER — Encounter: Payer: Self-pay | Admitting: Family Medicine

## 2023-06-04 VITALS — BP 124/80 | HR 87 | Temp 98.1°F | Resp 18 | Ht 66.0 in | Wt 167.0 lb

## 2023-06-04 DIAGNOSIS — E663 Overweight: Secondary | ICD-10-CM

## 2023-06-04 DIAGNOSIS — E785 Hyperlipidemia, unspecified: Secondary | ICD-10-CM

## 2023-06-04 DIAGNOSIS — Z0000 Encounter for general adult medical examination without abnormal findings: Secondary | ICD-10-CM | POA: Diagnosis not present

## 2023-06-04 DIAGNOSIS — I10 Essential (primary) hypertension: Secondary | ICD-10-CM

## 2023-06-04 MED ORDER — TIRZEPATIDE-WEIGHT MANAGEMENT 2.5 MG/0.5ML ~~LOC~~ SOLN: 2.5000 mg | SUBCUTANEOUS | 0 refills | Status: DC

## 2023-06-04 NOTE — Progress Notes (Signed)
 Established Patient Office Visit  Subjective   Patient ID: Ashley Salinas, female    DOB: [birth year]  Age: 68 y.o. MRN: [id number]  Chief Complaint  Patient presents with   Annual Exam    Pt states fasting.    Discussed the use of AI scribe software for clinical note transcription with the patient, who gave verbal consent to proceed.  History of Present Illness Ashley Salinas is a 68 year old female who presents for an annual physical exam.  She is currently taking Amondys and Flonase, which have effectively resolved her previous symptoms. She recently obtained a 90-day supply for each medication, indicating she is well-stocked.  Since mid-January, she has been on a weight loss journey, starting at 170 pounds and currently weighing 166 pounds at home and 167 pounds in the clinic. She attributes the difficulty in losing weight to her age and slowing metabolism, with a goal to reach 150 pounds to alleviate back and hip issues. She maintains a healthy diet and engages in regular exercise, including walking and using an elliptical machine.  She experiences allergy symptoms, particularly due to pollen, and manages them with Zyrtec and Flonase. She has some leftover Flonase at home.  She continues to receive spinal injections every three months for back pain management, with the last injection administered in January. These injections are effective in managing her symptoms. Her husband had back surgery in November and is awaiting further treatment options.  She has a history of a previous colonoscopy with one polyp found, but no family history of colon cancer. A mammogram in January was normal. She regularly visits the eye doctor and dentist, with dental visits occurring four times a year. No recent surgeries, and her stomach and joints, including her hips, are doing well. She is due for hip replacement at some point, as her current replacements are about 16-66 years old.    /   Patient Active  Problem List   Diagnosis Date Noted   Primary hypertension 03/20/2022   Acute non-recurrent pansinusitis 01/16/2022   Preventative health care 05/22/2021   Chronic right shoulder pain 03/01/2020   Chronic midline low back pain without sciatica 02/16/2020   Subacromial bursitis of right shoulder joint 11/10/2019   Synovitis of right shoulder 08/17/2019   Hyperlipidemia 04/21/2018   DEGENERATIVE JOINT DISEASE, HIPS 08/17/2009   HIP PAIN, BILATERAL 08/02/2009   DEPRESSION 07/06/2008   CALLUS, RIGHT FOOT 06/05/2007   Synovial cyst 06/05/2007   ELECTROCARDIOGRAM, ABNORMAL 06/05/2007   Past Medical History:  Diagnosis Date   Depression    Past Surgical History:  Procedure Laterality Date   APPENDECTOMY  1990   BREAST ENHANCEMENT SURGERY     LUMBAR FUSION  03/21/2007   SMALL INTESTINE SURGERY  1990   TOTAL HIP ARTHROPLASTY  10/11/2010,  01/2011   Left- Dr.Moore--- revision   TOTAL HIP ARTHROPLASTY     Left- Dr.Moore   TUBAL LIGATION  2004   Social History   Tobacco Use   Smoking status: Some Days    Current packs/day: 0.00    Average packs/day: 0.5 packs/day for 35.0 years (17.5 ttl pk-yrs)    Types: Cigarettes    Start date: 10/04/1975    Last attempt to quit: 10/04/2010    Years since quitting: 12.6   Smokeless tobacco: Never   Tobacco comments:    smoking on and off-- few cig here and there  Substance Use Topics   Alcohol use: Yes    Alcohol/week: 1.0 standard  drink of alcohol    Types: 1 Glasses of wine per week    Comment: rare-- actually < 1x    Drug use: No   Social History   Socioeconomic History   Marital status: Widowed    Spouse name: Not on file   Number of children: Not on file   Years of education: Not on file   Highest education level: Associate degree: academic program  Occupational History    Comment: unemployed-- looking for another job  Tobacco Use   Smoking status: Some Days    Current packs/day: 0.00    Average packs/day: 0.5 packs/day for 35.0  years (17.5 ttl pk-yrs)    Types: Cigarettes    Start date: 10/04/1975    Last attempt to quit: 10/04/2010    Years since quitting: 12.6   Smokeless tobacco: Never   Tobacco comments:    smoking on and off-- few cig here and there  Substance and Sexual Activity   Alcohol use: Yes    Alcohol/week: 1.0 standard drink of alcohol    Types: 1 Glasses of wine per week    Comment: rare-- actually < 1x    Drug use: No   Sexual activity: Yes    Partners: Male  Other Topics Concern   Not on file  Social History Narrative   Exercise-  walking some    Social Drivers of Health   Financial Resource Strain: Low Risk  (05/28/2023)   Overall Financial Resource Strain (CARDIA)    Difficulty of Paying Living Expenses: Not hard at all  Food Insecurity: No Food Insecurity (05/28/2023)   Hunger Vital Sign    Worried About Running Out of Food in the Last Year: Never true    Ran Out of Food in the Last Year: Never true  Transportation Needs: No Transportation Needs (05/28/2023)   PRAPARE - Administrator, Civil Service (Medical): No    Lack of Transportation (Non-Medical): No  Physical Activity: Insufficiently Active (05/28/2023)   Exercise Vital Sign    Days of Exercise per Week: 1 day    Minutes of Exercise per Session: 20 min  Stress: No Stress Concern Present (05/28/2023)   [employer] of Occupational Health - Occupational Stress Questionnaire    Feeling of Stress : Not at all  Social Connections: Moderately Isolated (05/28/2023)   Social Connection and Isolation Panel [NHANES]    Frequency of Communication with Friends and Family: Twice a week    Frequency of Social Gatherings with Friends and Family: Once a week    Attends Religious Services: 1 to 4 times per year    Active Member of [employer] or Organizations: No    Attends Banker Meetings: Not on file    Marital Status: Widowed  Intimate Partner Violence: Not At Risk (07/18/2021)   Humiliation, Afraid, Rape, and Kick  questionnaire    Fear of Current or Ex-Partner: No    Emotionally Abused: No    Physically Abused: No    Sexually Abused: No   Family Status  Relation Name Status   Mother  Deceased at age 2   Father  Alive   Sister  Alive  No partnership data on file   Family History  Problem Relation Age of Onset   Breast cancer Mother    Cancer Mother 46       breast   Arthritis Father    Cancer Father        multiple myeloma  stage 3B  Allergies  Allergen Reactions   Lisinopril Cough   Azithromycin Nausea Only   Oxycodone Nausea Only   Losartan Potassium Other (See Comments)    JOINT PAIN   Naproxen Rash      Review of Systems  Constitutional:  Negative for fever and malaise/fatigue.  HENT:  Negative for congestion.   Eyes:  Negative for blurred vision.  Respiratory:  Negative for cough and shortness of breath.   Cardiovascular:  Negative for chest pain, palpitations and leg swelling.  Gastrointestinal:  Negative for vomiting.  Musculoskeletal:  Negative for back pain.  Skin:  Negative for rash.  Neurological:  Negative for loss of consciousness and headaches.      Objective:     BP 124/80 (BP Location: Left Arm, Patient Position: Sitting, Cuff Size: Normal)   Pulse 87   Temp 98.1 F (36.7 C) (Oral)   Resp 18   Ht 5\' 6"  (1.676 m)   Wt 167 lb (75.8 kg)   SpO2 97%   BMI 26.95 kg/m  BP Readings from Last 3 Encounters:  06/04/23 124/80  05/29/22 124/80  04/16/22 (!) 142/88   Wt Readings from Last 3 Encounters:  06/04/23 167 lb (75.8 kg)  05/29/22 159 lb (72.1 kg)  04/16/22 161 lb (73 kg)   SpO2 Readings from Last 3 Encounters:  06/04/23 97%  05/29/22 99%  04/16/22 99%      Physical Exam Vitals and nursing note reviewed.  Constitutional:      General: She is not in acute distress.    Appearance: Normal appearance. She is well-developed.  HENT:     Head: Normocephalic and atraumatic.     Right Ear: Tympanic membrane, ear canal and external ear normal.  There is no impacted cerumen.     Left Ear: Tympanic membrane, ear canal and external ear normal. There is no impacted cerumen.     Nose: Nose normal.     Mouth/Throat:     Mouth: Mucous membranes are moist.     Pharynx: Oropharynx is clear. No oropharyngeal exudate or posterior oropharyngeal erythema.  Eyes:     General: No scleral icterus.       Right eye: No discharge.        Left eye: No discharge.     Conjunctiva/sclera: Conjunctivae normal.     Pupils: Pupils are equal, round, and reactive to light.  Neck:     Thyroid: No thyromegaly or thyroid tenderness.     Vascular: No JVD.  Cardiovascular:     Rate and Rhythm: Normal rate and regular rhythm.     Heart sounds: Normal heart sounds. No murmur heard. Pulmonary:     Effort: Pulmonary effort is normal. No respiratory distress.     Breath sounds: Normal breath sounds.  Abdominal:     General: Bowel sounds are normal. There is no distension.     Palpations: Abdomen is soft. There is no mass.     Tenderness: There is no abdominal tenderness. There is no guarding or rebound.  Musculoskeletal:        General: Normal range of motion.     Cervical back: Normal range of motion and neck supple.     Right lower leg: No edema.     Left lower leg: No edema.  Lymphadenopathy:     Cervical: No cervical adenopathy.  Skin:    General: Skin is warm and dry.     Findings: No erythema or rash.  Neurological:     Mental Status:  She is alert and oriented to person, place, and time.     Cranial Nerves: No cranial nerve deficit.     Deep Tendon Reflexes: Reflexes are normal and symmetric.  Psychiatric:        Mood and Affect: Mood normal.        Behavior: Behavior normal.        Thought Content: Thought content normal.        Judgment: Judgment normal.      No results found for any visits on 06/04/23.  Last CBC Lab Results  Component Value Date   WBC 6.3 05/29/2022   HGB 14.4 05/29/2022   HCT 42.1 05/29/2022   MCV 99.1  05/29/2022   RDW 13.4 05/29/2022   PLT 274.0 05/29/2022   Last metabolic panel Lab Results  Component Value Date   GLUCOSE 79 08/29/2022   NA 145 08/29/2022   K 4.2 08/29/2022   CL 109 08/29/2022   CO2 27 08/29/2022   BUN 20 08/29/2022   CREATININE 1.04 08/29/2022   GFR 56.32 (L) 08/29/2022   CALCIUM 9.6 08/29/2022   PROT 6.7 08/29/2022   ALBUMIN 4.2 08/29/2022   BILITOT 0.4 08/29/2022   ALKPHOS 49 08/29/2022   AST 27 08/29/2022   ALT 19 08/29/2022   Last lipids Lab Results  Component Value Date   CHOL 142 08/29/2022   HDL 63.40 08/29/2022   LDLCALC 122 (H) 05/29/2022   LDLDIRECT 54.0 08/29/2022   TRIG 211.0 (H) 08/29/2022   CHOLHDL 2 08/29/2022   Last hemoglobin A1c No results found for: "HGBA1C" Last thyroid functions Lab Results  Component Value Date   TSH 1.65 05/22/2021   Last vitamin D No results found for: "25OHVITD2", "25OHVITD3", "VD25OH" Last vitamin B12 and Folate No results found for: "VITAMINB12", "FOLATE"    The 10-year ASCVD risk score (Arnett DK, et al., 2019) is: 11.1%    Assessment & Plan:   Problem List Items Addressed This Visit       Unprioritized   Preventative health care - Primary   Relevant Orders   CBC with Differential/Platelet   Comprehensive metabolic panel with GFR   Lipid panel   TSH   Hyperlipidemia   Relevant Orders   CBC with Differential/Platelet   Comprehensive metabolic panel with GFR   Lipid panel   Primary hypertension   Relevant Orders   Comprehensive metabolic panel with GFR   Lipid panel   TSH   Other Visit Diagnoses       Overweight (BMI 25.0-29.9)       Relevant Medications   tirzepatide (ZEPBOUND) 2.5 MG/0.5ML injection vial   Other Relevant Orders   TSH   Insulin, random   Vitamin B12   VITAMIN D 25 Hydroxy (Vit-D Deficiency, Fractures)     Assessment and Plan Assessment & Plan Annual Wellness Visit   Her conditions are effectively managed with Amondys and Flonase, and she recently  acquired a 90-day supply, eliminating the need for refills. Perform lab work as part of the annual wellness visit.  Weight Management   She aims to lose weight to alleviate back and hip issues, having reduced her weight from 170 lbs to 167 lbs through healthy eating and moderate exercise. She is interested in tirzepatide to reach 150 lbs. Discussed cost, administration, and potential side effects, with an expected weight loss of at least a pound per week. She understands the need for dose adjustments based on response and side effects. Prescribe tirzepatide through [facility]. Instruct her  to expect a call from [employer] for payment and delivery arrangements. Advise her to monitor weight loss and report if dose adjustment is needed. Instruct her to maintain a high-protein, low-carb diet. Schedule follow-up every three months while on weight loss medication.  Chronic Back Pain   She manages chronic back pain with effective spinal injections every three months and is not interested in surgery. Continue spinal injections every three months as needed.  Allergic Rhinitis   She experiences pollen-related symptoms and uses Zyrtec and Flonase. Recommend Astepro nasal spray as an additional treatment option.  General Health Maintenance   She is up to date on her RSV vaccine and mammogram, does not receive COVID-19 vaccines, and had a colonoscopy with no polyps but will return in five years due to a previous polyp. She regularly visits the eye doctor and dentist. Schedule colonoscopy in five years. Continue regular eye exams annually. Continue dental visits four times a year.    No follow-ups on file.    Donato Schultz, DO

## 2023-06-05 LAB — COMPREHENSIVE METABOLIC PANEL WITH GFR
ALT: 35 U/L (ref 0–35)
AST: 38 U/L — ABNORMAL HIGH (ref 0–37)
Albumin: 4.9 g/dL (ref 3.5–5.2)
Alkaline Phosphatase: 57 U/L (ref 39–117)
BUN: 19 mg/dL (ref 6–23)
CO2: 25 meq/L (ref 19–32)
Calcium: 10.2 mg/dL (ref 8.4–10.5)
Chloride: 100 meq/L (ref 96–112)
Creatinine, Ser: 0.96 mg/dL (ref 0.40–1.20)
GFR: 61.66 mL/min (ref >60.00)
Glucose, Bld: 101 mg/dL — ABNORMAL HIGH (ref 70–99)
Potassium: 3 meq/L — ABNORMAL LOW (ref 3.5–5.1)
Sodium: 139 meq/L (ref 135–145)
Total Bilirubin: 0.6 mg/dL (ref 0.2–1.2)
Total Protein: 7.8 g/dL (ref 6.0–8.3)

## 2023-06-05 LAB — CBC WITH DIFFERENTIAL/PLATELET
Basophils Absolute: 0 10*3/uL (ref 0.0–0.1)
Basophils Relative: 0.8 % (ref 0.0–3.0)
Eosinophils Absolute: 0.1 10*3/uL (ref 0.0–0.7)
Eosinophils Relative: 1.5 % (ref 0.0–5.0)
HCT: 42.2 % (ref 36.0–46.0)
Hemoglobin: 14.3 g/dL (ref 12.0–15.0)
Lymphocytes Relative: 26.8 % (ref 12.0–46.0)
Lymphs Abs: 1.4 10*3/uL (ref 0.7–4.0)
MCHC: 33.8 g/dL (ref 30.0–36.0)
MCV: 99.6 fl (ref 78.0–100.0)
Monocytes Absolute: 0.3 10*3/uL (ref 0.1–1.0)
Monocytes Relative: 5.1 % (ref 3.0–12.0)
Neutro Abs: 3.4 10*3/uL (ref 1.4–7.7)
Neutrophils Relative %: 65.8 % (ref 43.0–77.0)
Platelets: 260 10*3/uL (ref 150.0–400.0)
RBC: 4.24 Mil/uL (ref 3.87–5.11)
RDW: 14.5 % (ref 11.5–15.5)
WBC: 5.1 10*3/uL (ref 4.0–10.5)

## 2023-06-05 LAB — LIPID PANEL
Cholesterol: 159 mg/dL (ref 0–200)
HDL: 75.7 mg/dL (ref >39.00)
LDL Cholesterol: 62 mg/dL (ref 0–99)
NonHDL: 83.37
Total CHOL/HDL Ratio: 2
Triglycerides: 107 mg/dL (ref 0.0–149.0)
VLDL: 21.4 mg/dL (ref 0.0–40.0)

## 2023-06-05 LAB — INSULIN, RANDOM: Insulin: 6.1 u[IU]/mL

## 2023-06-05 LAB — TSH: TSH: 1.5 u[IU]/mL (ref 0.35–5.50)

## 2023-06-05 LAB — VITAMIN B12: Vitamin B-12: 205 pg/mL — ABNORMAL LOW (ref 211–911)

## 2023-06-05 LAB — VITAMIN D 25 HYDROXY (VIT D DEFICIENCY, FRACTURES): VITD: 30.4 ng/mL (ref 30.00–100.00)

## 2023-06-05 MED ORDER — TIRZEPATIDE-WEIGHT MANAGEMENT 2.5 MG/0.5ML ~~LOC~~ SOLN: 2.5000 mg | SUBCUTANEOUS | 0 refills | Status: DC

## 2023-06-11 DIAGNOSIS — M5416 Radiculopathy, lumbar region: Secondary | ICD-10-CM | POA: Diagnosis not present

## 2023-06-13 ENCOUNTER — Encounter: Payer: Self-pay | Admitting: Family Medicine

## 2023-06-15 ENCOUNTER — Encounter: Payer: Self-pay | Admitting: Family Medicine

## 2023-06-17 ENCOUNTER — Other Ambulatory Visit: Payer: Self-pay

## 2023-06-17 DIAGNOSIS — E876 Hypokalemia: Secondary | ICD-10-CM

## 2023-06-17 MED ORDER — POTASSIUM CHLORIDE CRYS ER 20 MEQ PO TBCR
20.0000 meq | EXTENDED_RELEASE_TABLET | Freq: Every day | ORAL | 2 refills | Status: DC
Start: 2023-06-17 — End: 2023-07-22

## 2023-06-17 NOTE — Telephone Encounter (Signed)
 Pt aware and voices understanding.  Order placed, pt scheduled, RX sent to pharmacy.

## 2023-06-19 DIAGNOSIS — H35413 Lattice degeneration of retina, bilateral: Secondary | ICD-10-CM | POA: Diagnosis not present

## 2023-06-19 DIAGNOSIS — H04123 Dry eye syndrome of bilateral lacrimal glands: Secondary | ICD-10-CM | POA: Diagnosis not present

## 2023-06-19 DIAGNOSIS — H25813 Combined forms of age-related cataract, bilateral: Secondary | ICD-10-CM | POA: Diagnosis not present

## 2023-06-19 DIAGNOSIS — H5203 Hypermetropia, bilateral: Secondary | ICD-10-CM | POA: Diagnosis not present

## 2023-06-19 DIAGNOSIS — H524 Presbyopia: Secondary | ICD-10-CM | POA: Diagnosis not present

## 2023-06-23 ENCOUNTER — Other Ambulatory Visit: Payer: Self-pay | Admitting: Family Medicine

## 2023-06-23 DIAGNOSIS — I10 Essential (primary) hypertension: Secondary | ICD-10-CM

## 2023-06-24 MED ORDER — HYDROCHLOROTHIAZIDE 25 MG PO TABS: 25.0000 mg | ORAL_TABLET | Freq: Every day | ORAL | 0 refills | Status: DC

## 2023-07-03 ENCOUNTER — Other Ambulatory Visit (INDEPENDENT_AMBULATORY_CARE_PROVIDER_SITE_OTHER)

## 2023-07-03 DIAGNOSIS — E876 Hypokalemia: Secondary | ICD-10-CM

## 2023-07-04 LAB — BASIC METABOLIC PANEL WITH GFR
BUN: 20 mg/dL (ref 6–23)
CO2: 23 meq/L (ref 19–32)
Calcium: 9.8 mg/dL (ref 8.4–10.5)
Chloride: 101 meq/L (ref 96–112)
Creatinine, Ser: 0.93 mg/dL (ref 0.40–1.20)
GFR: 64.02 mL/min (ref >60.00)
Glucose, Bld: 92 mg/dL (ref 70–99)
Potassium: 3.3 meq/L — ABNORMAL LOW (ref 3.5–5.1)
Sodium: 137 meq/L (ref 135–145)

## 2023-07-06 ENCOUNTER — Encounter: Payer: Self-pay | Admitting: Family Medicine

## 2023-07-16 ENCOUNTER — Encounter: Payer: Self-pay | Admitting: Family Medicine

## 2023-07-16 ENCOUNTER — Ambulatory Visit (INDEPENDENT_AMBULATORY_CARE_PROVIDER_SITE_OTHER): Admitting: Family Medicine

## 2023-07-16 VITALS — BP 120/84 | HR 78 | Temp 98.1°F | Resp 16 | Ht 66.0 in | Wt 161.6 lb

## 2023-07-16 DIAGNOSIS — E663 Overweight: Secondary | ICD-10-CM | POA: Diagnosis not present

## 2023-07-16 MED ORDER — TIRZEPATIDE-WEIGHT MANAGEMENT 5 MG/0.5ML ~~LOC~~ SOLN
5.0000 mg | SUBCUTANEOUS | 0 refills | Status: DC
Start: 2023-07-16 — End: 2023-08-02

## 2023-07-16 NOTE — Progress Notes (Signed)
 Established Patient Office Visit  Subjective   Patient ID: Ashley Salinas, female    DOB: Mar 27, 1955  Age: 68 y.o. MRN: [id number]  Chief Complaint  Patient presents with   Weight Check    HPI Discussed the use of AI scribe software for clinical note transcription with the patient, who gave verbal consent to proceed.  History of Present Illness Ashley Salinas is a 68 year old female who presents for follow-up regarding weight loss management.  She continues to experience weight loss and is pleased with the results, noting no side effects from the treatment. Her last dose of medication was 2.5 mg, taken the Sunday before last, and she plans to increase the dose to 5 mg.  She confirms that she is consuming more protein as part of her dietary regimen. Her blood pressure is well-controlled, and she is compliant with her medications.   Patient Active Problem List   Diagnosis Date Noted   Primary hypertension 03/20/2022   Acute non-recurrent pansinusitis 01/16/2022   Preventative health care 05/22/2021   Chronic right shoulder pain 03/01/2020   Chronic midline low back pain without sciatica 02/16/2020   Subacromial bursitis of right shoulder joint 11/10/2019   Synovitis of right shoulder 08/17/2019   Hyperlipidemia 04/21/2018   DEGENERATIVE JOINT DISEASE, HIPS 08/17/2009   HIP PAIN, BILATERAL 08/02/2009   DEPRESSION 07/06/2008   CALLUS, RIGHT FOOT 06/05/2007   Synovial cyst 06/05/2007   ELECTROCARDIOGRAM, ABNORMAL 06/05/2007   Past Medical History:  Diagnosis Date   Depression    Past Surgical History:  Procedure Laterality Date   APPENDECTOMY  1990   BREAST ENHANCEMENT SURGERY     LUMBAR FUSION  03/21/2007   SMALL INTESTINE SURGERY  1990   TOTAL HIP ARTHROPLASTY  10/11/2010,  01/2011   Left- Dr.Moore--- revision   TOTAL HIP ARTHROPLASTY     Left- Dr.Moore   TUBAL LIGATION  2004   Social History   Tobacco Use   Smoking status: Some Days    Current packs/day: 0.00     Average packs/day: 0.5 packs/day for 35.0 years (17.5 ttl pk-yrs)    Types: Cigarettes    Start date: 10/04/1975    Last attempt to quit: 10/04/2010    Years since quitting: 12.7   Smokeless tobacco: Never   Tobacco comments:    smoking on and off-- few cig here and there  Substance Use Topics   Alcohol use: Yes    Alcohol/week: 1.0 standard drink of alcohol    Types: 1 Glasses of wine per week    Comment: rare-- actually < 1x    Drug use: No   Social History   Socioeconomic History   Marital status: Widowed    Spouse name: Not on file   Number of children: Not on file   Years of education: Not on file   Highest education level: Associate degree: academic program  Occupational History    Comment: unemployed-- looking for another job  Tobacco Use   Smoking status: Some Days    Current packs/day: 0.00    Average packs/day: 0.5 packs/day for 35.0 years (17.5 ttl pk-yrs)    Types: Cigarettes    Start date: 10/04/1975    Last attempt to quit: 10/04/2010    Years since quitting: 12.7   Smokeless tobacco: Never   Tobacco comments:    smoking on and off-- few cig here and there  Substance and Sexual Activity   Alcohol use: Yes    Alcohol/week: 1.0 standard  drink of alcohol    Types: 1 Glasses of wine per week    Comment: rare-- actually < 1x    Drug use: No   Sexual activity: Yes    Partners: Male  Other Topics Concern   Not on file  Social History Narrative   Exercise-  walking some    Social Drivers of Health   Financial Resource Strain: Low Risk  (05/28/2023)   Overall Financial Resource Strain (CARDIA)    Difficulty of Paying Living Expenses: Not hard at all  Food Insecurity: No Food Insecurity (05/28/2023)   Hunger Vital Sign    Worried About Running Out of Food in the Last Year: Never true    Ran Out of Food in the Last Year: Never true  Transportation Needs: No Transportation Needs (05/28/2023)   PRAPARE - Administrator, Civil Service (Medical): No    Lack  of Transportation (Non-Medical): No  Physical Activity: Insufficiently Active (05/28/2023)   Exercise Vital Sign    Days of Exercise per Week: 1 day    Minutes of Exercise per Session: 20 min  Stress: No Stress Concern Present (05/28/2023)   [employer] of Occupational Health - Occupational Stress Questionnaire    Feeling of Stress : Not at all  Social Connections: Moderately Isolated (05/28/2023)   Social Connection and Isolation Panel [NHANES]    Frequency of Communication with Friends and Family: Twice a week    Frequency of Social Gatherings with Friends and Family: Once a week    Attends Religious Services: 1 to 4 times per year    Active Member of [employer] or Organizations: No    Attends Banker Meetings: Not on file    Marital Status: Widowed  Intimate Partner Violence: Not At Risk (07/18/2021)   Humiliation, Afraid, Rape, and Kick questionnaire    Fear of Current or Ex-Partner: No    Emotionally Abused: No    Physically Abused: No    Sexually Abused: No   Family Status  Relation Name Status   Mother  Deceased at age 72   Father  Alive   Sister  Alive  No partnership data on file   Family History  Problem Relation Age of Onset   Breast cancer Mother    Cancer Mother 46       breast   Arthritis Father    Cancer Father        multiple myeloma  stage 3B   Allergies  Allergen Reactions   Lisinopril  Cough   Azithromycin Nausea Only   Oxycodone Nausea Only   Losartan  Potassium Other (See Comments)    JOINT PAIN   Naproxen  Rash      Review of Systems  Constitutional:  Negative for chills, fever and malaise/fatigue.  HENT:  Negative for congestion and hearing loss.   Eyes:  Negative for blurred vision and discharge.  Respiratory:  Negative for cough, sputum production and shortness of breath.   Cardiovascular:  Negative for chest pain, palpitations and leg swelling.  Gastrointestinal:  Negative for abdominal pain, blood in stool, constipation,  diarrhea, heartburn, nausea and vomiting.  Genitourinary:  Negative for dysuria, frequency, hematuria and urgency.  Musculoskeletal:  Negative for back pain, falls and myalgias.  Skin:  Negative for rash.  Neurological:  Negative for dizziness, sensory change, loss of consciousness, weakness and headaches.  Endo/Heme/Allergies:  Negative for environmental allergies. Does not bruise/bleed easily.  Psychiatric/Behavioral:  Negative for depression and suicidal ideas. The patient is not  nervous/anxious and does not have insomnia.       Objective:     BP 120/84 (BP Location: Left Arm, Patient Position: Sitting, Cuff Size: Normal)   Pulse 78   Temp 98.1 F (36.7 C) (Oral)   Resp 16   Ht 5\' 6"  (1.676 m)   Wt 161 lb 9.6 oz (73.3 kg)   SpO2 98%   BMI 26.08 kg/m  BP Readings from Last 3 Encounters:  07/16/23 120/84  06/04/23 124/80  05/29/22 124/80   Wt Readings from Last 3 Encounters:  07/16/23 161 lb 9.6 oz (73.3 kg)  06/04/23 167 lb (75.8 kg)  05/29/22 159 lb (72.1 kg)   SpO2 Readings from Last 3 Encounters:  07/16/23 98%  06/04/23 97%  05/29/22 99%      Physical Exam Vitals and nursing note reviewed.  Constitutional:      General: She is not in acute distress.    Appearance: Normal appearance. She is well-developed.  HENT:     Head: Normocephalic and atraumatic.  Eyes:     General: No scleral icterus.       Right eye: No discharge.        Left eye: No discharge.  Cardiovascular:     Rate and Rhythm: Normal rate and regular rhythm.     Heart sounds: No murmur heard. Pulmonary:     Effort: Pulmonary effort is normal. No respiratory distress.     Breath sounds: Normal breath sounds.  Musculoskeletal:        General: Normal range of motion.     Cervical back: Normal range of motion and neck supple.     Right lower leg: No edema.     Left lower leg: No edema.  Skin:    General: Skin is warm and dry.  Neurological:     Mental Status: She is alert and oriented to  person, place, and time.  Psychiatric:        Mood and Affect: Mood normal.        Behavior: Behavior normal.        Thought Content: Thought content normal.        Judgment: Judgment normal.      No results found for any visits on 07/16/23.  Last CBC Lab Results  Component Value Date   WBC 5.1 06/04/2023   HGB 14.3 06/04/2023   HCT 42.2 06/04/2023   MCV 99.6 06/04/2023   RDW 14.5 06/04/2023   PLT 260.0 06/04/2023   Last metabolic panel Lab Results  Component Value Date   GLUCOSE 92 07/03/2023   NA 137 07/03/2023   K 3.3 (L) 07/03/2023   CL 101 07/03/2023   CO2 23 07/03/2023   BUN 20 07/03/2023   CREATININE 0.93 07/03/2023   GFR 64.02 07/03/2023   CALCIUM  9.8 07/03/2023   PROT 7.8 06/04/2023   ALBUMIN 4.9 06/04/2023   BILITOT 0.6 06/04/2023   ALKPHOS 57 06/04/2023   AST 38 (H) 06/04/2023   ALT 35 06/04/2023   Last lipids Lab Results  Component Value Date   CHOL 159 06/04/2023   HDL 75.70 06/04/2023   LDLCALC 62 06/04/2023   LDLDIRECT 54.0 08/29/2022   TRIG 107.0 06/04/2023   CHOLHDL 2 06/04/2023   Last hemoglobin A1c No results found for: "HGBA1C" Last thyroid  functions Lab Results  Component Value Date   TSH 1.50 06/04/2023   Last vitamin D  Lab Results  Component Value Date   VD25OH 30.40 06/04/2023   Last vitamin B12 and Folate  Lab Results  Component Value Date   VITAMINB12 205 (L) 06/04/2023      The 10-year ASCVD risk score (Arnett DK, et al., 2019) is: 10.3%    Assessment & Plan:   Problem List Items Addressed This Visit   None Visit Diagnoses       Overweight (BMI 25.0-29.9)    -  Primary   Relevant Medications   tirzepatide 5 MG/0.5ML injection vial     Assessment and Plan Assessment & Plan Weight loss   Continued weight loss with no side effects. She responds well to the current 2.5 mg medication dose, which is uncommon. Increase medication dose to 5 mg. She is increasing protein intake as part of the weight loss plan.  Encourage further increased protein intake.    Return in about 3 months (around 10/16/2023), or if symptoms worsen or fail to improve.    Amen Dargis R Lowne Chase, DO

## 2023-07-16 NOTE — Patient Instructions (Signed)

## 2023-07-21 ENCOUNTER — Encounter: Payer: Self-pay | Admitting: Family Medicine

## 2023-07-21 ENCOUNTER — Other Ambulatory Visit: Payer: Self-pay | Admitting: Family Medicine

## 2023-07-21 DIAGNOSIS — E876 Hypokalemia: Secondary | ICD-10-CM

## 2023-07-21 DIAGNOSIS — I10 Essential (primary) hypertension: Secondary | ICD-10-CM

## 2023-07-22 ENCOUNTER — Telehealth: Payer: Self-pay

## 2023-07-22 ENCOUNTER — Other Ambulatory Visit: Payer: Self-pay | Admitting: Family Medicine

## 2023-07-22 DIAGNOSIS — E876 Hypokalemia: Secondary | ICD-10-CM

## 2023-07-22 MED ORDER — POTASSIUM CHLORIDE CRYS ER 20 MEQ PO TBCR: 20.0000 meq | EXTENDED_RELEASE_TABLET | Freq: Every day | ORAL | 2 refills | Status: DC

## 2023-07-22 MED ORDER — HYDROCHLOROTHIAZIDE 25 MG PO TABS: 25.0000 mg | ORAL_TABLET | Freq: Every day | ORAL | 2 refills | Status: DC

## 2023-07-22 MED ORDER — POTASSIUM CHLORIDE CRYS ER 20 MEQ PO TBCR: 20.0000 meq | EXTENDED_RELEASE_TABLET | Freq: Two times a day (BID) | ORAL | 1 refills | Status: DC

## 2023-07-22 NOTE — Telephone Encounter (Signed)
**Note De-identified  [place] Obfuscation** Please advise 

## 2023-07-22 NOTE — Telephone Encounter (Signed)
 Please see note below regarding request for adjusted prescription; please advise.    Copied from CRM [id number]. Topic: Clinical - Prescription Issue >> Jul 22, 2023  1:44 PM Magdalene [employer] wrote: Reason for CRM: Patient calling because she was told by her provider to start taking potasium twice a day after her most recent labs but the pharmacy stated that they do not have the adjusted prescription. Patient would like adjusted prescription to be sent to pharmacy.  [facility] [id number] - [place], [place] - [address] [place] [address] Phone: [phone] Fax: [phone]

## 2023-08-02 ENCOUNTER — Other Ambulatory Visit: Payer: Self-pay | Admitting: Family Medicine

## 2023-08-02 DIAGNOSIS — E663 Overweight: Secondary | ICD-10-CM

## 2023-08-16 ENCOUNTER — Other Ambulatory Visit: Payer: Self-pay | Admitting: Family Medicine

## 2023-09-11 DIAGNOSIS — M5416 Radiculopathy, lumbar region: Secondary | ICD-10-CM | POA: Diagnosis not present

## 2023-10-08 ENCOUNTER — Ambulatory Visit (INDEPENDENT_AMBULATORY_CARE_PROVIDER_SITE_OTHER): Admitting: Family Medicine

## 2023-10-08 ENCOUNTER — Encounter: Payer: Self-pay | Admitting: Family Medicine

## 2023-10-08 VITALS — BP 120/82 | HR 95 | Temp 98.4°F | Resp 18 | Ht 66.0 in | Wt 155.6 lb

## 2023-10-08 DIAGNOSIS — E663 Overweight: Secondary | ICD-10-CM

## 2023-10-08 DIAGNOSIS — E785 Hyperlipidemia, unspecified: Secondary | ICD-10-CM

## 2023-10-08 DIAGNOSIS — I10 Essential (primary) hypertension: Secondary | ICD-10-CM

## 2023-10-08 DIAGNOSIS — E876 Hypokalemia: Secondary | ICD-10-CM | POA: Diagnosis not present

## 2023-10-08 LAB — CBC WITH DIFFERENTIAL/PLATELET
Basophils Absolute: 0 K/uL (ref 0.0–0.1)
Basophils Relative: 0.4 % (ref 0.0–3.0)
Eosinophils Absolute: 0 K/uL (ref 0.0–0.7)
Eosinophils Relative: 0.6 % (ref 0.0–5.0)
HCT: 41.6 % (ref 36.0–46.0)
Hemoglobin: 14.1 g/dL (ref 12.0–15.0)
Lymphocytes Relative: 25.3 % (ref 12.0–46.0)
Lymphs Abs: 1.4 K/uL (ref 0.7–4.0)
MCHC: 33.8 g/dL (ref 30.0–36.0)
MCV: 97.8 fl (ref 78.0–100.0)
Monocytes Absolute: 0.4 K/uL (ref 0.1–1.0)
Monocytes Relative: 7.5 % (ref 3.0–12.0)
Neutro Abs: 3.7 K/uL (ref 1.4–7.7)
Neutrophils Relative %: 66.2 % (ref 43.0–77.0)
Platelets: 265 K/uL (ref 150.0–400.0)
RBC: 4.26 Mil/uL (ref 3.87–5.11)
RDW: 13.8 % (ref 11.5–15.5)
WBC: 5.6 K/uL (ref 4.0–10.5)

## 2023-10-08 LAB — COMPREHENSIVE METABOLIC PANEL WITH GFR
ALT: 21 U/L (ref 0–35)
AST: 30 U/L (ref 0–37)
Albumin: 4.5 g/dL (ref 3.5–5.2)
Alkaline Phosphatase: 47 U/L (ref 39–117)
BUN: 16 mg/dL (ref 6–23)
CO2: 26 meq/L (ref 19–32)
Calcium: 10.1 mg/dL (ref 8.4–10.5)
Chloride: 105 meq/L (ref 96–112)
Creatinine, Ser: 0.96 mg/dL (ref 0.40–1.20)
GFR: 61.51 mL/min (ref >60.00)
Glucose, Bld: 84 mg/dL (ref 70–99)
Potassium: 4.3 meq/L (ref 3.5–5.1)
Sodium: 141 meq/L (ref 135–145)
Total Bilirubin: 0.5 mg/dL (ref 0.2–1.2)
Total Protein: 7.3 g/dL (ref 6.0–8.3)

## 2023-10-08 LAB — TSH: TSH: 1.59 u[IU]/mL (ref 0.35–5.50)

## 2023-10-08 LAB — LIPID PANEL
Cholesterol: 159 mg/dL (ref 0–200)
HDL: 60.9 mg/dL (ref >39.00)
LDL Cholesterol: 69 mg/dL (ref 0–99)
NonHDL: 97.88
Total CHOL/HDL Ratio: 3
Triglycerides: 143 mg/dL (ref 0.0–149.0)
VLDL: 28.6 mg/dL (ref 0.0–40.0)

## 2023-10-08 NOTE — Patient Instructions (Signed)

## 2023-10-08 NOTE — Progress Notes (Signed)
 "  Subjective:    Patient ID: Ashley Salinas, female    DOB: 13-Oct-1955, 68 y.o.   MRN: [id number]  Chief Complaint  Patient presents with   Weight Check    HPI Patient is in today for weight check.  Discussed the use of AI scribe software for clinical note transcription with the patient, who gave verbal consent to proceed.  History of Present Illness Betheny L Ligman is a 68 year old female who presents for a follow-up visit regarding her medication management and blood work.  She is currently taking a medication at a dose of 5 mg and experiences some side effects. She prefers to maintain her current dosage and does not wish to increase it at this time.  Her potassium levels were previously low, recorded at 3.3 mmol/L. She finds the potassium pills difficult to swallow and breaks them in half to manage ingestion, despite being on a 20 mg dose.  Her back has not been bothering her recently. )  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,  01/2011   Left- Dr.Moore--- revision   TOTAL HIP ARTHROPLASTY     Left- Dr.Moore   TUBAL LIGATION  2004    Family History  Problem Relation Age of Onset   Breast cancer Mother    Cancer Mother 67       breast   Arthritis Father    Cancer Father        multiple myeloma  stage 3B    Social History   Socioeconomic History   Marital status: Widowed    Spouse name: Not on file   Number of children: Not on file   Years of education: Not on file   Highest education level: Associate degree: occupational, scientist, product/process development, or vocational program  Occupational History    Comment: unemployed-- looking for another job  Tobacco Use   Smoking status: Some Days    Current packs/day: 0.00    Average packs/day: 0.5 packs/day for 35.0 years (17.5  ttl pk-yrs)    Types: Cigarettes    Start date: 10/04/1975    Last attempt to quit: 10/04/2010    Years since quitting: 13.0   Smokeless tobacco: Never   Tobacco comments:    smoking on and off-- few cig here and there  Substance and Sexual Activity   Alcohol use: Yes    Alcohol/week: 1.0 standard drink of alcohol    Types: 1 Glasses of wine per week    Comment: rare-- actually < 1x    Drug use: No   Sexual activity: Yes    Partners: Male  Other Topics Concern   Not on file  Social History Narrative   Exercise-  walking some    Social Drivers of Health   Financial Resource Strain: Low Risk  (10/01/2023)   Overall Financial Resource Strain (CARDIA)    Difficulty of Paying Living Expenses: Not hard at all  Food Insecurity: No Food Insecurity (10/01/2023)   Hunger Vital Sign    Worried About Running Out of Food in the Last Year: Never true    Ran Out of Food in the Last Year: Never true  Transportation Needs: No Transportation Needs (10/01/2023)   PRAPARE - Transportation    Lack  of Transportation (Medical): No    Lack of Transportation (Non-Medical): No  Physical Activity: Insufficiently Active (10/01/2023)   Exercise Vital Sign    Days of Exercise per Week: 2 days    Minutes of Exercise per Session: 20 min  Stress: No Stress Concern Present (10/01/2023)   [employer] of Occupational Health - Occupational Stress Questionnaire    Feeling of Stress: Not at all  Social Connections: Moderately Isolated (10/01/2023)   Social Connection and Isolation Panel    Frequency of Communication with Friends and Family: More than three times a week    Frequency of Social Gatherings with Friends and Family: More than three times a week    Attends Religious Services: 1 to 4 times per year    Active Member of [employer] or Organizations: No    Attends Banker Meetings: Not on file    Marital Status: Widowed  Intimate Partner Violence: Not At Risk (07/18/2021)   Humiliation, Afraid,  Rape, and Kick questionnaire    Fear of Current or Ex-Partner: No    Emotionally Abused: No    Physically Abused: No    Sexually Abused: No    Outpatient Medications Prior to Visit  Medication Sig Dispense Refill   acetaminophen  (TYLENOL ) 325 MG tablet      amLODipine  (NORVASC ) 5 MG tablet TAKE 1 TABLET BY MOUTH DAILY 90 tablet 1   amoxicillin  (AMOXIL ) 500 MG capsule 4 capsules (2gm) po 1 hour prior to dental work 12 capsule 1   hydrochlorothiazide  (HYDRODIURIL ) 25 MG tablet TAKE 1 TABLET BY MOUTH DAILY 90 tablet 0   Multiple Vitamin (MULTIVITAMIN) capsule Take 1 capsule by mouth daily.     potassium chloride  SA (KLOR-CON  M20) 20 MEQ tablet Take 1 tablet (20 mEq total) by mouth 2 (two) times daily. 180 tablet 1   rosuvastatin  (CRESTOR ) 10 MG tablet Take 1 tablet (10 mg total) by mouth daily. 90 tablet 0   tirzepatide  (ZEPBOUND ) 5 MG/0.5ML injection vial Inject 5 mg into the skin once a week. 2 mL 3   hydrochlorothiazide  (HYDRODIURIL ) 25 MG tablet Take 1 tablet (25 mg total) by mouth daily. 30 tablet 2   potassium chloride  SA (KLOR-CON  M) 20 MEQ tablet Take 1 tablet (20 mEq total) by mouth daily. 30 tablet 2   No facility-administered medications prior to visit.    Allergies  Allergen Reactions   Lisinopril  Cough   Azithromycin Nausea Only   Oxycodone Nausea Only   Losartan  Potassium Other (See Comments)    JOINT PAIN   Naproxen  Rash    Review of Systems  Constitutional:  Negative for chills, fever and malaise/fatigue.  HENT:  Negative for congestion and hearing loss.   Eyes:  Negative for discharge.  Respiratory:  Negative for cough, sputum production and shortness of breath.   Cardiovascular:  Negative for chest pain, palpitations and leg swelling.  Gastrointestinal:  Negative for abdominal pain, blood in stool, constipation, diarrhea, heartburn, nausea and vomiting.  Genitourinary:  Negative for dysuria, frequency, hematuria and urgency.  Musculoskeletal:  Negative for back  pain, falls and myalgias.  Skin:  Negative for rash.  Neurological:  Negative for dizziness, sensory change, loss of consciousness, weakness and headaches.  Endo/Heme/Allergies:  Negative for environmental allergies. Does not bruise/bleed easily.  Psychiatric/Behavioral:  Negative for depression and suicidal ideas. The patient is not nervous/anxious and does not have insomnia.        Objective:    Physical Exam Vitals and nursing note reviewed.  Constitutional:  General: She is not in acute distress.    Appearance: Normal appearance. She is well-developed.  HENT:     Head: Normocephalic and atraumatic.  Eyes:     General: No scleral icterus.       Right eye: No discharge.        Left eye: No discharge.  Cardiovascular:     Rate and Rhythm: Normal rate and regular rhythm.     Heart sounds: No murmur heard. Pulmonary:     Effort: Pulmonary effort is normal. No respiratory distress.     Breath sounds: Normal breath sounds.  Musculoskeletal:        General: Normal range of motion.     Cervical back: Normal range of motion and neck supple.     Right lower leg: No edema.     Left lower leg: No edema.  Skin:    General: Skin is warm and dry.  Neurological:     Mental Status: She is alert and oriented to person, place, and time.  Psychiatric:        Mood and Affect: Mood normal.        Behavior: Behavior normal.        Thought Content: Thought content normal.        Judgment: Judgment normal.     BP 120/82 (BP Location: Left Arm, Patient Position: Sitting, Cuff Size: Large)   Pulse 95   Temp 98.4 F (36.9 C) (Oral)   Resp 18   Ht 5' 6 (1.676 m)   Wt 155 lb 9.6 oz (70.6 kg)   SpO2 97%   BMI 25.11 kg/m  Wt Readings from Last 3 Encounters:  10/08/23 155 lb 9.6 oz (70.6 kg)  07/16/23 161 lb 9.6 oz (73.3 kg)  06/04/23 167 lb (75.8 kg)    Diabetic Foot Exam - Simple   No data filed    Lab Results  Component Value Date   WBC 5.6 10/08/2023   HGB 14.1  10/08/2023   HCT 41.6 10/08/2023   PLT 265.0 10/08/2023   GLUCOSE 84 10/08/2023   CHOL 159 10/08/2023   TRIG 143.0 10/08/2023   HDL 60.90 10/08/2023   LDLDIRECT 54.0 08/29/2022   LDLCALC 69 10/08/2023   ALT 21 10/08/2023   AST 30 10/08/2023   NA 141 10/08/2023   K 4.3 10/08/2023   CL 105 10/08/2023   CREATININE 0.96 10/08/2023   BUN 16 10/08/2023   CO2 26 10/08/2023   TSH 1.59 10/08/2023    Lab Results  Component Value Date   TSH 1.59 10/08/2023   Lab Results  Component Value Date   WBC 5.6 10/08/2023   HGB 14.1 10/08/2023   HCT 41.6 10/08/2023   MCV 97.8 10/08/2023   PLT 265.0 10/08/2023   Lab Results  Component Value Date   NA 141 10/08/2023   K 4.3 10/08/2023   CO2 26 10/08/2023   GLUCOSE 84 10/08/2023   BUN 16 10/08/2023   CREATININE 0.96 10/08/2023   BILITOT 0.5 10/08/2023   ALKPHOS 47 10/08/2023   AST 30 10/08/2023   ALT 21 10/08/2023   PROT 7.3 10/08/2023   ALBUMIN 4.5 10/08/2023   CALCIUM  10.1 10/08/2023   GFR 61.51 10/08/2023   Lab Results  Component Value Date   CHOL 159 10/08/2023   Lab Results  Component Value Date   HDL 60.90 10/08/2023   Lab Results  Component Value Date   LDLCALC 69 10/08/2023   Lab Results  Component Value Date  TRIG 143.0 10/08/2023   Lab Results  Component Value Date   CHOLHDL 3 10/08/2023   No results found for: HGBA1C     Assessment & Plan:  Overweight (BMI 25.0-29.9) Assessment & Plan: Doing well with weight loss Return to office 3 months   Hypokalemia -     Comprehensive metabolic panel with GFR  Primary hypertension Assessment & Plan: Well controlled, no changes to meds. Encouraged heart healthy diet such as the DASH diet and exercise as tolerated.    Orders: -     CBC with Differential/Platelet -     Comprehensive metabolic panel with GFR -     Lipid panel -     TSH  Hyperlipidemia, unspecified hyperlipidemia type Assessment & Plan: Encourage heart healthy diet such as MIND or  DASH diet, increase exercise, avoid trans fats, simple carbohydrates and processed foods, consider a krill or fish or flaxseed oil cap daily.    Orders: -     Comprehensive metabolic panel with GFR -     Lipid panel -     TSH  Assessment and Plan Assessment & Plan Hypokalemia   Her potassium level was previously 3.3. She takes potassium supplements but struggles to swallow the pills due to their size, so she breaks them in half. Order blood work to check potassium levels.  Obesity   She is on a 5 mg dose of weight management medication, which she finds effective and wishes to continue. Her gradual weight loss is beneficial for long-term maintenance. Continue the current medication and schedule a follow-up appointment in three months for weight management.    Dwanda Tufano R Lowne Chase, DO "

## 2023-10-10 DIAGNOSIS — E663 Overweight: Secondary | ICD-10-CM | POA: Insufficient documentation

## 2023-10-10 NOTE — Assessment & Plan Note (Signed)
 Encourage heart healthy diet such as MIND or DASH diet, increase exercise, avoid trans fats, simple carbohydrates and processed foods, consider a krill or fish or flaxseed oil cap daily.

## 2023-10-10 NOTE — Assessment & Plan Note (Signed)
 Doing well with weight loss Return to office 3 months

## 2023-10-10 NOTE — Assessment & Plan Note (Signed)
 Well controlled, no changes to meds. Encouraged heart healthy diet such as the DASH diet and exercise as tolerated.

## 2023-10-13 ENCOUNTER — Ambulatory Visit: Payer: Self-pay | Admitting: Family Medicine

## 2023-10-18 ENCOUNTER — Other Ambulatory Visit: Payer: Self-pay | Admitting: Family Medicine

## 2023-10-18 DIAGNOSIS — I10 Essential (primary) hypertension: Secondary | ICD-10-CM

## 2023-11-23 ENCOUNTER — Other Ambulatory Visit: Payer: Self-pay | Admitting: Family Medicine

## 2023-11-23 DIAGNOSIS — I10 Essential (primary) hypertension: Secondary | ICD-10-CM

## 2023-11-26 ENCOUNTER — Encounter: Payer: Self-pay | Admitting: Family Medicine

## 2023-11-26 DIAGNOSIS — E663 Overweight: Secondary | ICD-10-CM

## 2023-11-26 MED ORDER — ZEPBOUND 5 MG/0.5ML ~~LOC~~ SOLN: 5.0000 mg | SUBCUTANEOUS | 3 refills | Status: DC

## 2023-11-28 ENCOUNTER — Encounter: Payer: Self-pay | Admitting: Family Medicine

## 2023-11-28 DIAGNOSIS — Z96643 Presence of artificial hip joint, bilateral: Secondary | ICD-10-CM

## 2023-11-28 MED ORDER — AMOXICILLIN 500 MG PO CAPS: ORAL_CAPSULE | ORAL | 1 refills | Status: DC

## 2023-12-23 ENCOUNTER — Encounter: Payer: Self-pay | Admitting: Family Medicine

## 2023-12-25 DIAGNOSIS — M5416 Radiculopathy, lumbar region: Secondary | ICD-10-CM | POA: Diagnosis not present

## 2024-01-15 ENCOUNTER — Other Ambulatory Visit: Payer: Self-pay | Admitting: Family Medicine

## 2024-01-15 DIAGNOSIS — E876 Hypokalemia: Secondary | ICD-10-CM

## 2024-01-21 ENCOUNTER — Encounter: Payer: Self-pay | Admitting: Family Medicine

## 2024-01-21 ENCOUNTER — Ambulatory Visit (INDEPENDENT_AMBULATORY_CARE_PROVIDER_SITE_OTHER): Admitting: Family Medicine

## 2024-01-21 VITALS — BP 108/80 | HR 82 | Temp 97.8°F | Resp 16 | Ht 66.0 in | Wt 152.2 lb

## 2024-01-21 DIAGNOSIS — E663 Overweight: Secondary | ICD-10-CM

## 2024-01-21 DIAGNOSIS — I10 Essential (primary) hypertension: Secondary | ICD-10-CM | POA: Diagnosis not present

## 2024-01-21 DIAGNOSIS — E785 Hyperlipidemia, unspecified: Secondary | ICD-10-CM

## 2024-01-21 DIAGNOSIS — M25511 Pain in right shoulder: Secondary | ICD-10-CM | POA: Diagnosis not present

## 2024-01-21 DIAGNOSIS — G8929 Other chronic pain: Secondary | ICD-10-CM

## 2024-01-21 MED ORDER — TIRZEPATIDE-WEIGHT MANAGEMENT 2.5 MG/0.5ML ~~LOC~~ SOLN: 2.5000 mg | SUBCUTANEOUS | 3 refills | Status: AC

## 2024-01-21 NOTE — Progress Notes (Signed)
 Subjective:    Patient ID: Ashley Salinas, female    DOB: 1955/09/16, 68 y.o.   MRN: [id number]  Chief Complaint  Patient presents with   Weight Check    HPI Patient is in today for weight check.   Discussed the use of AI scribe software for clinical note transcription with the patient, who gave verbal consent to proceed.  History of Present Illness Ashley Salinas is a 68 year old female with arthritis and spinal stenosis who presents for medication management and follow-up.  She desires to decrease her medication dose to two and a half for maintenance. She has lost about ten pounds and aims to maintain her current weight. She is considering extending the interval between doses to every two weeks or longer.  Her back pain is currently well-controlled with no issues reported. She continues to follow up with her specialist and plans to return in January for her next injection, which she finds helpful. However, her arthritis has worsened, particularly on the right side where the stenosis is located. She recalls being told about a spinal curvature as a child but did not experience problems until later in life. Her initial surgery was for stenosis at a cyst, not scoliosis.  Her husband is experiencing severe arthritis pain in his back, despite the leg pain resolving post-surgery. He is frustrated with the ongoing pain and is awaiting further evaluation, including an MRI, to determine the next steps. He has a history of working with farm animals, which involved physically demanding tasks that may have contributed to his condition.    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,  01/2011   Left- Dr.Moore--- revision   TOTAL HIP ARTHROPLASTY     Left- Dr.Moore   TUBAL LIGATION  2004     Family History  Problem Relation Age of Onset   Breast cancer Mother    Cancer Mother 19       breast   Arthritis Father    Cancer Father        multiple myeloma  stage 3B    Social History   Socioeconomic History   Marital status: Widowed    Spouse name: Not on file   Number of children: Not on file   Years of education: Not on file   Highest education level: Associate degree: academic program  Occupational History    Comment: unemployed-- looking for another job  Tobacco Use   Smoking status: Some Days    Current packs/day: 0.00    Average packs/day: 0.5 packs/day for 35.0 years (17.5 ttl pk-yrs)    Types: Cigarettes    Start date: 10/04/1975    Last attempt to quit: 10/04/2010    Years since quitting: 13.3   Smokeless tobacco: Never   Tobacco comments:    smoking on and off-- few cig here and there  Substance and Sexual Activity   Alcohol use: Yes    Alcohol/week: 1.0 standard drink of alcohol    Types: 1 Glasses of wine per week    Comment: rare-- actually < 1x    Drug use: No   Sexual activity: Yes    Partners: Male  Other Topics Concern   Not on file  Social History  Narrative   Exercise-  walking some    Social Drivers of Health   Financial Resource Strain: Low Risk  (01/14/2024)   Overall Financial Resource Strain (CARDIA)    Difficulty of Paying Living Expenses: Not hard at all  Food Insecurity: No Food Insecurity (01/14/2024)   Hunger Vital Sign    Worried About Running Out of Food in the Last Year: Never true    Ran Out of Food in the Last Year: Never true  Transportation Needs: No Transportation Needs (01/14/2024)   PRAPARE - Administrator, Civil Service (Medical): No    Lack of Transportation (Non-Medical): No  Physical Activity: Insufficiently Active (01/14/2024)   Exercise Vital Sign    Days of Exercise per Week: 2 days    Minutes of Exercise per Session: 20 min  Stress: No Stress Concern Present (01/14/2024)   [employer] of Occupational Health - Occupational Stress Questionnaire    Feeling of Stress: Not at all  Social Connections: Moderately Isolated (01/14/2024)   Social Connection and Isolation Panel    Frequency of Communication with Friends and Family: Twice a week    Frequency of Social Gatherings with Friends and Family: Twice a week    Attends Religious Services: 1 to 4 times per year    Active Member of [employer] or Organizations: No    Attends Banker Meetings: Not on file    Marital Status: Widowed  Intimate Partner Violence: Not At Risk (07/18/2021)   Humiliation, Afraid, Rape, and Kick questionnaire    Fear of Current or Ex-Partner: No    Emotionally Abused: No    Physically Abused: No    Sexually Abused: No    Outpatient Medications Prior to Visit  Medication Sig Dispense Refill   acetaminophen  (TYLENOL ) 325 MG tablet      amLODipine  (NORVASC ) 5 MG tablet TAKE 1 TABLET BY MOUTH DAILY 90 tablet 1   amoxicillin  (AMOXIL ) 500 MG capsule 4 capsules (2gm) po 1 hour prior to dental work 12 capsule 1   hydrochlorothiazide  (HYDRODIURIL ) 25 MG tablet TAKE 1 TABLET BY MOUTH DAILY 30 tablet 2   Multiple Vitamin (MULTIVITAMIN) capsule Take 1 capsule by mouth daily.     potassium chloride  SA (KLOR-CON  M) 20 MEQ tablet TAKE 1 TABLET BY MOUTH 2 TIMES A DAY 180 tablet 1   rosuvastatin  (CRESTOR ) 10 MG tablet TAKE 1 TABLET BY MOUTH DAILY 90 tablet 0   tirzepatide  (ZEPBOUND ) 5 MG/0.5ML injection vial Inject 5 mg into the skin once a week. 2 mL 3   No facility-administered medications prior to visit.    Allergies  Allergen Reactions   Lisinopril  Cough   Azithromycin Nausea Only   Oxycodone Nausea Only   Losartan  Potassium Other (See Comments)    JOINT PAIN   Naproxen  Rash    Review of Systems  Constitutional:  Negative for fever and malaise/fatigue.  HENT:  Negative for congestion.   Eyes:  Negative for blurred vision.  Respiratory:  Negative for cough and shortness of breath.    Cardiovascular:  Negative for chest pain, palpitations and leg swelling.  Gastrointestinal:  Negative for vomiting.  Musculoskeletal:  Positive for back pain.  Skin:  Negative for rash.  Neurological:  Negative for loss of consciousness and headaches.       Objective:    Physical Exam Vitals and nursing note reviewed.  Constitutional:      General: She is not in acute distress.    Appearance: Normal appearance.  She is well-developed.  HENT:     Head: Normocephalic and atraumatic.     Right Ear: Tympanic membrane, ear canal and external ear normal. There is no impacted cerumen.     Left Ear: Tympanic membrane, ear canal and external ear normal. There is no impacted cerumen.     Nose: Nose normal.     Mouth/Throat:     Mouth: Mucous membranes are moist.     Pharynx: Oropharynx is clear. No oropharyngeal exudate or posterior oropharyngeal erythema.  Eyes:     General: No scleral icterus.       Right eye: No discharge.        Left eye: No discharge.     Conjunctiva/sclera: Conjunctivae normal.     Pupils: Pupils are equal, round, and reactive to light.  Neck:     Thyroid : No thyromegaly or thyroid  tenderness.     Vascular: No JVD.  Cardiovascular:     Rate and Rhythm: Normal rate and regular rhythm.     Heart sounds: Normal heart sounds. No murmur heard. Pulmonary:     Effort: Pulmonary effort is normal. No respiratory distress.     Breath sounds: Normal breath sounds.  Abdominal:     General: Bowel sounds are normal. There is no distension.     Palpations: Abdomen is soft. There is no mass.     Tenderness: There is no abdominal tenderness. There is no guarding or rebound.  Genitourinary:    Vagina: Normal.  Musculoskeletal:        General: Normal range of motion.     Cervical back: Normal range of motion and neck supple.     Right lower leg: No edema.     Left lower leg: No edema.  Lymphadenopathy:     Cervical: No cervical adenopathy.  Skin:    General: Skin is  warm and dry.     Findings: No erythema or rash.  Neurological:     Mental Status: She is alert and oriented to person, place, and time.     Cranial Nerves: No cranial nerve deficit.     Deep Tendon Reflexes: Reflexes are normal and symmetric.  Psychiatric:        Mood and Affect: Mood normal.        Behavior: Behavior normal.        Thought Content: Thought content normal.        Judgment: Judgment normal.     BP 108/80 (BP Location: Left Arm, Patient Position: Sitting, Cuff Size: Normal)   Pulse 82   Temp 97.8 F (36.6 C) (Oral)   Resp 16   Ht 5' 6 (1.676 m)   Wt 152 lb 3.2 oz (69 kg)   SpO2 100%   BMI 24.57 kg/m  Wt Readings from Last 3 Encounters:  01/21/24 152 lb 3.2 oz (69 kg)  10/08/23 155 lb 9.6 oz (70.6 kg)  07/16/23 161 lb 9.6 oz (73.3 kg)    Diabetic Foot Exam - Simple   No data filed    Lab Results  Component Value Date   WBC 5.6 10/08/2023   HGB 14.1 10/08/2023   HCT 41.6 10/08/2023   PLT 265.0 10/08/2023   GLUCOSE 84 10/08/2023   CHOL 159 10/08/2023   TRIG 143.0 10/08/2023   HDL 60.90 10/08/2023   LDLDIRECT 54.0 08/29/2022   LDLCALC 69 10/08/2023   ALT 21 10/08/2023   AST 30 10/08/2023   NA 141 10/08/2023   K 4.3 10/08/2023   CL  105 10/08/2023   CREATININE 0.96 10/08/2023   BUN 16 10/08/2023   CO2 26 10/08/2023   TSH 1.59 10/08/2023    Lab Results  Component Value Date   TSH 1.59 10/08/2023   Lab Results  Component Value Date   WBC 5.6 10/08/2023   HGB 14.1 10/08/2023   HCT 41.6 10/08/2023   MCV 97.8 10/08/2023   PLT 265.0 10/08/2023   Lab Results  Component Value Date   NA 141 10/08/2023   K 4.3 10/08/2023   CO2 26 10/08/2023   GLUCOSE 84 10/08/2023   BUN 16 10/08/2023   CREATININE 0.96 10/08/2023   BILITOT 0.5 10/08/2023   ALKPHOS 47 10/08/2023   AST 30 10/08/2023   ALT 21 10/08/2023   PROT 7.3 10/08/2023   ALBUMIN 4.5 10/08/2023   CALCIUM  10.1 10/08/2023   GFR 61.51 10/08/2023   Lab Results  Component Value Date    CHOL 159 10/08/2023   Lab Results  Component Value Date   HDL 60.90 10/08/2023   Lab Results  Component Value Date   LDLCALC 69 10/08/2023   Lab Results  Component Value Date   TRIG 143.0 10/08/2023   Lab Results  Component Value Date   CHOLHDL 3 10/08/2023   No results found for: HGBA1C     Assessment & Plan:  Overweight (BMI 25.0-29.9) -     Tirzepatide -Weight Management; Inject 2.5 mg into the skin once a week.  Dispense: 2 mL; Refill: 3  Primary hypertension Assessment & Plan: Well controlled, no changes to meds. Encouraged heart healthy diet such as the DASH diet and exercise as tolerated.     Hyperlipidemia, unspecified hyperlipidemia type Assessment & Plan: Lab Results  Component Value Date   CHOL 159 10/08/2023   HDL 60.90 10/08/2023   LDLCALC 69 10/08/2023   LDLDIRECT 54.0 08/29/2022   TRIG 143.0 10/08/2023   CHOLHDL 3 10/08/2023   Encourage heart healthy diet such as MIND or DASH diet, increase exercise, avoid trans fats, simple carbohydrates and processed foods, consider a krill or fish or flaxseed oil cap daily.     Chronic right shoulder pain Assessment & Plan: Per orth   Assessment and Plan Assessment & Plan Overweight   She has lost approximately ten pounds and wishes to maintain her current weight. She is on a medication with a half-life of twenty-one days, allowing for extended dosing intervals. Continue current medication regimen with extended dosing intervals as needed for weight maintenance.  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 She has undergone multiple surgeries and receives injections for pain management. She is not interested in further surgical interventions and prefers to manage symptoms with injections. Continue follow-up with Dr. Dartha for injections and management of arthritis pain. Schedule next  injection appointment in January.  General Health Maintenance   She is due for routine lab work and a physical examination. Labs were last checked in August, and a physical is planned for April. Schedule routine lab work and physical examination in April.    Anna-Marie Coller R Lowne Chase, DO

## 2024-01-21 NOTE — Assessment & Plan Note (Signed)
 Well controlled, no changes to meds. Encouraged heart healthy diet such as the DASH diet and exercise as tolerated.

## 2024-01-21 NOTE — Assessment & Plan Note (Signed)
 Per [employer]

## 2024-01-21 NOTE — Assessment & Plan Note (Addendum)
 Lab Results  Component Value Date   CHOL 159 10/08/2023   HDL 60.90 10/08/2023   LDLCALC 69 10/08/2023   LDLDIRECT 54.0 08/29/2022   TRIG 143.0 10/08/2023   CHOLHDL 3 10/08/2023   Encourage heart healthy diet such as MIND or DASH diet, increase exercise, avoid trans fats, simple carbohydrates and processed foods, consider a krill or fish or flaxseed oil cap daily.

## 2024-01-30 ENCOUNTER — Other Ambulatory Visit: Payer: Self-pay | Admitting: Family Medicine

## 2024-01-30 DIAGNOSIS — I10 Essential (primary) hypertension: Secondary | ICD-10-CM

## 2024-01-31 ENCOUNTER — Encounter: Payer: Self-pay | Admitting: Family Medicine

## 2024-02-04 ENCOUNTER — Encounter: Payer: Self-pay | Admitting: Family Medicine

## 2024-02-05 ENCOUNTER — Other Ambulatory Visit: Payer: Self-pay | Admitting: Family Medicine

## 2024-02-05 DIAGNOSIS — M545 Low back pain, unspecified: Secondary | ICD-10-CM

## 2024-02-28 ENCOUNTER — Other Ambulatory Visit: Payer: Self-pay | Admitting: Family Medicine

## 2024-03-01 ENCOUNTER — Telehealth: Admitting: Family

## 2024-03-01 DIAGNOSIS — J019 Acute sinusitis, unspecified: Secondary | ICD-10-CM

## 2024-03-01 MED ORDER — PREDNISONE 10 MG (21) PO TBPK: ORAL_TABLET | ORAL | 0 refills | Status: DC

## 2024-03-01 MED ORDER — AMOXICILLIN-POT CLAVULANATE 875-125 MG PO TABS: 1.0000 | ORAL_TABLET | Freq: Two times a day (BID) | ORAL | 0 refills | Status: DC

## 2024-03-01 NOTE — Addendum Note (Signed)
 Addended by: LAVELL LYE A on: 03/01/2024 10:41 AM   Modules accepted: Orders

## 2024-03-01 NOTE — Progress Notes (Signed)

## 2024-03-21 ENCOUNTER — Other Ambulatory Visit: Payer: Self-pay | Admitting: Family Medicine

## 2024-03-21 DIAGNOSIS — E876 Hypokalemia: Secondary | ICD-10-CM

## 2024-04-02 LAB — HM MAMMOGRAPHY

## 2024-04-03 ENCOUNTER — Encounter: Payer: Self-pay | Admitting: Family Medicine

## 2024-04-04 ENCOUNTER — Other Ambulatory Visit: Payer: Self-pay | Admitting: Family Medicine

## 2024-04-04 DIAGNOSIS — Z96643 Presence of artificial hip joint, bilateral: Secondary | ICD-10-CM

## 2024-04-06 ENCOUNTER — Telehealth: Admitting: Physician Assistant

## 2024-04-06 DIAGNOSIS — R3989 Other symptoms and signs involving the genitourinary system: Secondary | ICD-10-CM

## 2024-04-06 MED ORDER — CEPHALEXIN 500 MG PO CAPS: 500.0000 mg | ORAL_CAPSULE | Freq: Two times a day (BID) | ORAL | 0 refills | Status: AC

## 2024-04-06 NOTE — Progress Notes (Signed)

## 2024-04-08 ENCOUNTER — Encounter: Payer: Self-pay | Admitting: Family Medicine

## 2024-04-09 NOTE — Telephone Encounter (Signed)
 Pt is scheduled

## 2024-04-09 NOTE — Telephone Encounter (Signed)
 Lvm unable to schedule pt.

## 2024-06-08 ENCOUNTER — Ambulatory Visit
